# Patient Record
Sex: Male | Born: 1960 | Race: White | Hispanic: No | Marital: Single
Health system: Southern US, Community
[De-identification: ages and names within clinical notes are randomized; demographics above are authoritative.]

## PROBLEM LIST (undated history)

## (undated) DIAGNOSIS — F1010 Alcohol abuse, uncomplicated: Secondary | ICD-10-CM

---

## 2009-09-30 ENCOUNTER — Emergency Department (HOSPITAL_COMMUNITY): Admission: EM | Admit: 2009-09-30 | Discharge: 2009-10-01 | Payer: Self-pay | Admitting: Emergency Medicine

## 2011-03-10 LAB — DIFFERENTIAL
Basophils Absolute: 0 10*3/uL (ref 0.0–0.1)
Eosinophils Relative: 11 % — ABNORMAL HIGH (ref 0–5)
Lymphocytes Relative: 54 % — ABNORMAL HIGH (ref 12–46)
Lymphs Abs: 3.4 10*3/uL (ref 0.7–4.0)
Neutro Abs: 1.8 10*3/uL (ref 1.7–7.7)
Neutrophils Relative %: 28 % — ABNORMAL LOW (ref 43–77)

## 2011-03-10 LAB — CBC
HCT: 43.6 % (ref 39.0–52.0)
Hemoglobin: 15.2 g/dL (ref 13.0–17.0)
Platelets: 224 10*3/uL (ref 150–400)

## 2011-03-10 LAB — RAPID URINE DRUG SCREEN, HOSP PERFORMED
Amphetamines: NOT DETECTED
Barbiturates: NOT DETECTED
Cocaine: NOT DETECTED
Opiates: NOT DETECTED

## 2011-03-10 LAB — BASIC METABOLIC PANEL
GFR calc non Af Amer: >60 mL/min (ref >60)
Sodium: 138 mEq/L (ref 135–145)

## 2011-03-10 LAB — ETHANOL: Alcohol, Ethyl (B): 237 mg/dL — ABNORMAL HIGH (ref 0–10)

## 2013-11-03 ENCOUNTER — Emergency Department (HOSPITAL_COMMUNITY)
Admission: EM | Admit: 2013-11-03 | Discharge: 2013-11-03 | Disposition: A | Discharge status: Home / Self Care | Payer: Self-pay | Attending: Emergency Medicine | Admitting: Emergency Medicine

## 2013-11-03 ENCOUNTER — Emergency Department (HOSPITAL_COMMUNITY): Payer: Self-pay

## 2013-11-03 ENCOUNTER — Encounter (HOSPITAL_COMMUNITY): Payer: Self-pay | Admitting: Emergency Medicine

## 2013-11-03 DIAGNOSIS — Y929 Unspecified place or not applicable: Secondary | ICD-10-CM | POA: Insufficient documentation

## 2013-11-03 DIAGNOSIS — S0181XA Laceration without foreign body of other part of head, initial encounter: Secondary | ICD-10-CM

## 2013-11-03 DIAGNOSIS — R296 Repeated falls: Secondary | ICD-10-CM | POA: Insufficient documentation

## 2013-11-03 DIAGNOSIS — F1010 Alcohol abuse, uncomplicated: Secondary | ICD-10-CM | POA: Insufficient documentation

## 2013-11-03 DIAGNOSIS — S060X0A Concussion without loss of consciousness, initial encounter: Secondary | ICD-10-CM

## 2013-11-03 DIAGNOSIS — F10929 Alcohol use, unspecified with intoxication, unspecified: Secondary | ICD-10-CM

## 2013-11-03 DIAGNOSIS — F17200 Nicotine dependence, unspecified, uncomplicated: Secondary | ICD-10-CM | POA: Insufficient documentation

## 2013-11-03 DIAGNOSIS — Y939 Activity, unspecified: Secondary | ICD-10-CM | POA: Insufficient documentation

## 2013-11-03 DIAGNOSIS — S0180XA Unspecified open wound of other part of head, initial encounter: Secondary | ICD-10-CM | POA: Insufficient documentation

## 2013-11-03 MED ORDER — LIDOCAINE-EPINEPHRINE-TETRACAINE (LET) SOLUTION
3.0000 mL | Freq: Once | NASAL | Status: AC
  Administered 2013-11-03: 3 mL via TOPICAL
  Filled 2013-11-03: qty 3

## 2013-11-03 NOTE — ED Provider Notes (Signed)
7:00 AM = Laceration repaired for Dr. Oletta Lamas.  Patient states tetanus up to date.  Will observe patient.     LACERATION REPAIR Date/Time: 11/03/2013 7:06 AM Performed by: Coral Ceo K Authorized by: Jillyn Ledger Consent: Verbal consent obtained. Consent given by: patient Body area: head/neck Location details: left eyebrow Laceration length: 4 cm Foreign bodies: no foreign bodies Tendon involvement: none Nerve involvement: none Vascular damage: no Anesthesia: local infiltration Local anesthetic: lidocaine 2% without epinephrine Anesthetic total: 2 ml Patient sedated: no Preparation: Patient was prepped and draped in the usual sterile fashion. Irrigation solution: saline Irrigation method: jet lavage Amount of cleaning: extensive Debridement: none Degree of undermining: none Skin closure: 4-0 Prolene Number of sutures: 6 Technique: simple Approximation: close Approximation difficulty: simple Dressing: antibiotic ointment and 4x4 sterile gauze Patient tolerance: Patient tolerated the procedure well with no immediate complications.    Rechecks 7:40 AM = Patient up walking in the hallway without difficulty.  No concerns at this time.  Patient wants to be discharged.    Filed Vitals:   11/03/13 0456 11/03/13 0500 11/03/13 0545 11/03/13 0751  BP: 144/89 122/78 119/73 101/86  Pulse: 89 74 74 74  Temp:      TempSrc:      Resp: 20   15  SpO2: 98% 98% 96% 99%     Patient stable for discharge  Joseph Bell           Jillyn Ledger, PA-C 11/03/13 9147

## 2013-11-03 NOTE — ED Notes (Addendum)
Patient ambulated in hallway without incident. Patient refusing to go to CT, states he is ready to go.

## 2013-11-03 NOTE — ED Notes (Signed)
Patient discharged to home with family. NAD.  
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
## 2014-11-15 ENCOUNTER — Emergency Department (HOSPITAL_COMMUNITY): Payer: Self-pay

## 2014-11-15 ENCOUNTER — Encounter (HOSPITAL_COMMUNITY): Payer: Self-pay | Admitting: Emergency Medicine

## 2014-11-15 ENCOUNTER — Emergency Department (HOSPITAL_COMMUNITY)
Admission: EM | Admit: 2014-11-15 | Discharge: 2014-11-16 | Disposition: A | Discharge status: Home / Self Care | Payer: Self-pay | Attending: Emergency Medicine | Admitting: Emergency Medicine

## 2014-11-15 DIAGNOSIS — Y998 Other external cause status: Secondary | ICD-10-CM | POA: Insufficient documentation

## 2014-11-15 DIAGNOSIS — F10929 Alcohol use, unspecified with intoxication, unspecified: Secondary | ICD-10-CM

## 2014-11-15 DIAGNOSIS — Y9289 Other specified places as the place of occurrence of the external cause: Secondary | ICD-10-CM | POA: Insufficient documentation

## 2014-11-15 DIAGNOSIS — S0101XA Laceration without foreign body of scalp, initial encounter: Secondary | ICD-10-CM | POA: Insufficient documentation

## 2014-11-15 DIAGNOSIS — W109XXA Fall (on) (from) unspecified stairs and steps, initial encounter: Secondary | ICD-10-CM | POA: Insufficient documentation

## 2014-11-15 DIAGNOSIS — F10129 Alcohol abuse with intoxication, unspecified: Secondary | ICD-10-CM | POA: Insufficient documentation

## 2014-11-15 DIAGNOSIS — T1490XA Injury, unspecified, initial encounter: Secondary | ICD-10-CM

## 2014-11-15 DIAGNOSIS — Y9389 Activity, other specified: Secondary | ICD-10-CM | POA: Insufficient documentation

## 2014-11-15 DIAGNOSIS — Z720 Tobacco use: Secondary | ICD-10-CM | POA: Insufficient documentation

## 2014-11-15 LAB — CBC
HCT: 42 % (ref 39.0–52.0)
HEMOGLOBIN: 15.2 g/dL (ref 13.0–17.0)
MCH: 36 pg — ABNORMAL HIGH (ref 26.0–34.0)
MCHC: 36.2 g/dL — ABNORMAL HIGH (ref 30.0–36.0)
MCV: 99.5 fL (ref 78.0–100.0)
PLATELETS: 173 10*3/uL (ref 150–400)
RBC: 4.22 MIL/uL (ref 4.22–5.81)
RDW: 12.3 % (ref 11.5–15.5)
WBC: 5.9 10*3/uL (ref 4.0–10.5)

## 2014-11-15 LAB — COMPREHENSIVE METABOLIC PANEL
ALT: 24 U/L (ref 0–53)
AST: 41 U/L — AB (ref 0–37)
Albumin: 4 g/dL (ref 3.5–5.2)
Alkaline Phosphatase: 46 U/L (ref 39–117)
Anion gap: 15 (ref 5–15)
BILIRUBIN TOTAL: 0.3 mg/dL (ref 0.3–1.2)
BUN: 6 mg/dL (ref 6–23)
CALCIUM: 9 mg/dL (ref 8.4–10.5)
CO2: 22 mEq/L (ref 19–32)
CREATININE: 0.77 mg/dL (ref 0.50–1.35)
Chloride: 101 mEq/L (ref 96–112)
GFR calc Af Amer: >90 mL/min (ref >90)
Glucose, Bld: 93 mg/dL (ref 70–99)
POTASSIUM: 4.1 meq/L (ref 3.7–5.3)
Sodium: 138 mEq/L (ref 137–147)
Total Protein: 7.3 g/dL (ref 6.0–8.3)

## 2014-11-15 LAB — PROTIME-INR
INR: 1.04 (ref 0.00–1.49)
Prothrombin Time: 13.7 seconds (ref 11.6–15.2)

## 2014-11-15 MED ORDER — HALOPERIDOL LACTATE 5 MG/ML IJ SOLN
2.0000 mg | Freq: Once | INTRAMUSCULAR | Status: AC
  Administered 2014-11-15: 2 mg via INTRAVENOUS
  Filled 2014-11-15: qty 1

## 2014-11-15 MED ORDER — HALOPERIDOL LACTATE 5 MG/ML IJ SOLN
INTRAMUSCULAR | Status: AC | PRN
  Administered 2014-11-15: 2 mg via INTRAVENOUS

## 2014-11-15 MED ORDER — FENTANYL CITRATE 0.05 MG/ML IJ SOLN
INTRAMUSCULAR | Status: AC
  Administered 2014-11-15: 50 ug
  Filled 2014-11-15: qty 2

## 2014-11-15 MED ORDER — HALOPERIDOL LACTATE 5 MG/ML IJ SOLN
INTRAMUSCULAR | Status: AC
  Administered 2014-11-15: 2 mg via INTRAVENOUS
  Filled 2014-11-15: qty 1

## 2014-11-15 MED ORDER — SODIUM CHLORIDE 0.9 % IV BOLUS (SEPSIS)
500.0000 mL | Freq: Once | INTRAVENOUS | Status: AC
  Administered 2014-11-15: 500 mL via INTRAVENOUS

## 2014-11-15 MED ORDER — FENTANYL CITRATE 0.05 MG/ML IJ SOLN
50.0000 ug | Freq: Once | INTRAMUSCULAR | Status: AC
  Administered 2014-11-15: 50 ug via INTRAVENOUS
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
## 2014-11-15 NOTE — ED Notes (Signed)
Pt transported to CT via transport and this RN. Josh, EMT at bedside.
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
## 2014-11-15 NOTE — ED Notes (Signed)
Pt wallet with ID-NO CASH (verified with Josh, EMT) and clothing removed by EMS placed in pt belonging bag and placed at bedside.

## 2014-11-15 NOTE — ED Notes (Signed)
Pt transported to x-ray with this RN and Josh, EMT.

## 2014-11-15 NOTE — ED Notes (Signed)
Pt arrived back in TRA A, placed on heart monitor. Oxygen saturations noted to be 88-89% RA, pt placed on 2L nasal cannula. Current oxygen saturation 98% on 2L.
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
## 2014-11-16 NOTE — Discharge Instructions (Signed)
Alcohol Intoxication Alcohol intoxication occurs when the amount of alcohol that a person has consumed impairs his or her ability to mentally and physically function. Alcohol directly impairs the normal chemical activity of the brain. Drinking large amounts of alcohol can lead to changes in mental function and behavior, and it can cause many physical effects that can be harmful.  Alcohol intoxication can range in severity from mild to very severe. Various factors can affect the level of intoxication that occurs, such as the person's age, gender, weight, frequency of alcohol consumption, and the presence of other medical conditions (such as diabetes, seizures, or heart conditions). Dangerous levels of alcohol intoxication may occur when people drink large amounts of alcohol in a short period (binge drinking). Alcohol can also be especially dangerous when combined with certain prescription medicines or "recreational" drugs. SIGNS AND SYMPTOMS Some common signs and symptoms of mild alcohol intoxication include:  Loss of coordination.  Changes in mood and behavior.  Impaired judgment.  Slurred speech. As alcohol intoxication progresses to more severe levels, other signs and symptoms will appear. These may include:  Vomiting.  Confusion and impaired memory.  Slowed breathing.  Seizures.  Loss of consciousness. DIAGNOSIS  Your health care provider will take a medical history and perform a physical exam. You will be asked about the amount and type of alcohol you have consumed. Blood tests will be done to measure the concentration of alcohol in your blood. In many places, your blood alcohol level must be lower than 80 mg/dL (4.09%0.08%) to legally drive. However, many dangerous effects of alcohol can occur at much lower levels.  TREATMENT  People with alcohol intoxication often do not require treatment. Most of the effects of alcohol intoxication are temporary, and they go away as the alcohol naturally  leaves the body. Your health care provider will monitor your condition until you are stable enough to go home. Fluids are sometimes given through an IV access tube to help prevent dehydration.  HOME CARE INSTRUCTIONS  Do not drive after drinking alcohol.  Stay hydrated. Drink enough water and fluids to keep your urine clear or pale yellow. Avoid caffeine.   Only take over-the-counter or prescription medicines as directed by your health care provider.  SEEK MEDICAL CARE IF:   You have persistent vomiting.   You do not feel better after a few days.  You have frequent alcohol intoxication. Your health care provider can help determine if you should see a substance use treatment counselor. SEEK IMMEDIATE MEDICAL CARE IF:   You become shaky or tremble when you try to stop drinking.   You shake uncontrollably (seizure).   You throw up (vomit) blood. This may be bright red or may look like black coffee grounds.   You have blood in your stool. This may be bright red or may appear as a black, tarry, bad smelling stool.   You become lightheaded or faint.  MAKE SURE YOU:   Understand these instructions.  Will watch your condition.  Will get help right away if you are not doing well or get worse. Document Released: 08/31/2005 Document Revised: 07/24/2013 Document Reviewed: 04/26/2013 [facility] Patient Information 2015 [place], [place]. This information is not intended to replace advice given to you by your health care provider. Make sure you discuss any questions you have with your health care provider.  Staple Care and Removal Your caregiver has used staples today to repair your wound. Staples are used to help a wound heal faster by holding the  edges of the wound together. The staples can be removed when the wound has healed well enough to stay together after the staples are removed. A dressing (wound covering), depending on the location of the wound, may have been applied. This may be  changed once per day or as instructed. If the dressing sticks, it may be soaked off with soapy water or hydrogen peroxide. Only take over-the-counter or prescription medicines for pain, discomfort, or fever as directed by your caregiver.  If you did not receive a tetanus shot today because you did not recall when your last one was given, check with your caregiver when you have your staples removed to determine if one is needed. Return to your caregiver's office in 1 week or as suggested to have your staples removed. SEEK IMMEDIATE MEDICAL CARE IF:   You have redness, swelling, or increasing pain in the wound.  You have pus coming from the wound.  You have a fever.  You notice a bad smell coming from the wound or dressing.  Your wound edges break open after staples have been removed. Document Released: 08/16/2001 Document Revised: 02/13/2012 Document Reviewed: 08/31/2005 [facility] Patient Information 2015 [place], [place]. This information is not intended to replace advice given to you by your health care provider. Make sure you discuss any questions you have with your health care provider.

## 2014-11-16 NOTE — ED Provider Notes (Signed)
Patient has been observed overnight in the ED. He is resting comfortably and is now awake and alert and stable for discharge.  Joseph Boozeavid Quintarius Ferns, MD 11/16/14 [phone]

## 2014-11-16 NOTE — ED Notes (Signed)
Supplies to bedside for irrigation

## 2014-11-16 NOTE — ED Notes (Signed)
Dr Preston FleetingGlick in w/pt

## 2014-11-16 NOTE — ED Notes (Signed)
7-8 staples placed to laceration at posterior head. Bleeding controlled. Pt tolerated well

## 2017-03-18 ENCOUNTER — Emergency Department (HOSPITAL_COMMUNITY)
Admission: EM | Admit: 2017-03-18 | Discharge: 2017-03-18 | Disposition: A | Discharge status: Home / Self Care | Payer: Self-pay | Attending: Emergency Medicine | Admitting: Emergency Medicine

## 2017-03-18 ENCOUNTER — Emergency Department (HOSPITAL_COMMUNITY): Payer: Self-pay

## 2017-03-18 ENCOUNTER — Encounter (HOSPITAL_COMMUNITY): Payer: Self-pay | Admitting: Emergency Medicine

## 2017-03-18 DIAGNOSIS — T391X1A Poisoning by 4-Aminophenol derivatives, accidental (unintentional), initial encounter: Secondary | ICD-10-CM | POA: Insufficient documentation

## 2017-03-18 DIAGNOSIS — R0602 Shortness of breath: Secondary | ICD-10-CM | POA: Insufficient documentation

## 2017-03-18 DIAGNOSIS — F17200 Nicotine dependence, unspecified, uncomplicated: Secondary | ICD-10-CM | POA: Insufficient documentation

## 2017-03-18 DIAGNOSIS — F19929 Other psychoactive substance use, unspecified with intoxication, unspecified: Secondary | ICD-10-CM

## 2017-03-18 DIAGNOSIS — T402X1A Poisoning by other opioids, accidental (unintentional), initial encounter: Secondary | ICD-10-CM | POA: Insufficient documentation

## 2017-03-18 LAB — COMPREHENSIVE METABOLIC PANEL
ALT: 28 U/L (ref 17–63)
AST: 34 U/L (ref 15–41)
Albumin: 3.7 g/dL (ref 3.5–5.0)
Alkaline Phosphatase: 44 U/L (ref 38–126)
Anion gap: 10 (ref 5–15)
BUN: 5 mg/dL — ABNORMAL LOW (ref 6–20)
CHLORIDE: 107 mmol/L (ref 101–111)
CO2: 22 mmol/L (ref 22–32)
CREATININE: 0.58 mg/dL — AB (ref 0.61–1.24)
Calcium: 8.1 mg/dL — ABNORMAL LOW (ref 8.9–10.3)
Glucose, Bld: 104 mg/dL — ABNORMAL HIGH (ref 65–99)
POTASSIUM: 3.3 mmol/L — AB (ref 3.5–5.1)
SODIUM: 139 mmol/L (ref 135–145)
Total Bilirubin: 0.4 mg/dL (ref 0.3–1.2)
Total Protein: 6.8 g/dL (ref 6.5–8.1)

## 2017-03-18 LAB — CBC
HCT: 36.8 % — ABNORMAL LOW (ref 39.0–52.0)
Hemoglobin: 13.2 g/dL (ref 13.0–17.0)
MCH: 34.8 pg — ABNORMAL HIGH (ref 26.0–34.0)
MCHC: 35.9 g/dL (ref 30.0–36.0)
MCV: 97.1 fL (ref 78.0–100.0)
Platelets: 222 10*3/uL (ref 150–400)
RBC: 3.79 MIL/uL — AB (ref 4.22–5.81)
RDW: 13.4 % (ref 11.5–15.5)
WBC: 5.3 10*3/uL (ref 4.0–10.5)

## 2017-03-18 LAB — RAPID URINE DRUG SCREEN, HOSP PERFORMED
AMPHETAMINES: NOT DETECTED
Barbiturates: NOT DETECTED
Benzodiazepines: NOT DETECTED
Cocaine: POSITIVE — AB
OPIATES: POSITIVE — AB
Tetrahydrocannabinol: NOT DETECTED

## 2017-03-18 LAB — SALICYLATE LEVEL

## 2017-03-18 LAB — ETHANOL: Alcohol, Ethyl (B): 266 mg/dL — ABNORMAL HIGH (ref <5)

## 2017-03-18 LAB — ACETAMINOPHEN LEVEL: Acetaminophen (Tylenol), Serum: <10 ug/mL — ABNORMAL LOW (ref 10–30)

## 2017-03-18 NOTE — ED Notes (Signed)
Pt placed of 4 lpm [place] while sleeping.

## 2017-03-18 NOTE — ED Triage Notes (Signed)
Per EMS pt roommate called related to pt collapsing and believes it was an overdose "because he talked about getting percocet." With EMS arrival pt unresponsive, not breathing, and pupils pin point, but had a pulse. Pt given 0.5 mg of narcan en route with EMS, post administration pt able to breath on his own.

## 2017-03-18 NOTE — ED Notes (Signed)
Patient requesting to go home.

## 2017-03-18 NOTE — ED Notes (Signed)
Requested patient to urinate. 

## 2017-03-18 NOTE — ED Provider Notes (Signed)
8:16 PM Assumed care from Dr. Fredderick Phenix, please see their note for full history, physical and decision making until this point. In brief this is a 56 y.o. year old male who presented to the ED tonight with Drug Overdose     Here for ingestion of alcohol and opiates and waiting to metabolize. Workup otherwise negative.   I was advised by nursing patient was awake and wanting to go home.  On my evaluation, alert, oriented, no slurred speech, knows how to get home, good balance, able to tie shoes without difficulty. Answers questions in full sentences.   Appears to be clinically sober with normal vital signs.   Plan for discharge.   Discharge instructions, including strict return precautions for new or worsening symptoms, given. Patient and/or family verbalized understanding and agreement with the plan as described.   Labs, studies and imaging reviewed by myself and considered in medical decision making if ordered. Imaging interpreted by radiology.  Labs Reviewed  COMPREHENSIVE METABOLIC PANEL - Abnormal; Notable for the following:       Result Value   Potassium 3.3 (*)    Glucose, Bld 104 (*)    BUN 5 (*)    Creatinine, Ser 0.58 (*)    Calcium 8.1 (*)    All other components within normal limits  ETHANOL - Abnormal; Notable for the following:    Alcohol, Ethyl (B) 266 (*)    All other components within normal limits  ACETAMINOPHEN LEVEL - Abnormal; Notable for the following:    Acetaminophen (Tylenol), Serum <10 (*)    All other components within normal limits  CBC - Abnormal; Notable for the following:    RBC 3.79 (*)    HCT 36.8 (*)    MCH 34.8 (*)    All other components within normal limits  SALICYLATE LEVEL  RAPID URINE DRUG SCREEN, HOSP PERFORMED  CBG MONITORING, ED    DG Chest Port 1 View  Final Result    CT Head Wo Contrast  Final Result    CT Cervical Spine Wo Contrast  Final Result      No Follow-up on file.    Marily Memos, MD 03/18/17 2019

## 2017-03-18 NOTE — ED Provider Notes (Signed)
[facility]-EMERGENCY DEPT Provider Note   CSN: [id number] Arrival date & time: 03/18/17  1513     History   Chief Complaint Chief Complaint  Patient presents with  . Drug Overdose    HPI Joseph Bell is a 56 y.o. male.  Patient is a 56 year old male with a history of alcohol abuse who presents with possible overdose. Per EMS, he lives with a roommate who states that earlier in the afternoon the patient was seen on the deck acting normally.  The roommate went to the store and when they got back and found the patient less responsive. He was grabbing for something on the counter and then collapsed to the floor. Did not appear that there was any notable head trauma. The patient became unresponsive and EMS was called. He was given 0.5 mg of Narcan with some improvement in symptoms. He's been maintaining normal oxygen saturations but has still been confused. Patient denies any drug use. He does say that he's had 2 40 ounce beers today.       History reviewed. No pertinent past medical history.  There are no active problems to display for this patient.   History reviewed. No pertinent surgical history.     Home Medications    Prior to Admission medications   Not on File    Family History No family history on file.  Social History Social History  Substance Use Topics  . Smoking status: Current Every Day Smoker  . Smokeless tobacco: Not on file  . Alcohol use Yes     Allergies   Patient has no known allergies.   Review of Systems Review of Systems  Constitutional: Negative for chills, diaphoresis, fatigue and fever.  HENT: Negative for congestion, rhinorrhea and sneezing.   Eyes: Negative.   Respiratory: Negative for cough, chest tightness and shortness of breath.   Cardiovascular: Negative for chest pain and leg swelling.  Gastrointestinal: Negative for abdominal pain, blood in stool, diarrhea, nausea and vomiting.  Genitourinary: Negative for difficulty  urinating, flank pain, frequency and hematuria.  Musculoskeletal: Negative for arthralgias and back pain.  Skin: Negative for rash.  Neurological: Negative for dizziness, speech difficulty, weakness, numbness and headaches.     Physical Exam Updated Vital Signs BP 104/65   Pulse 63   Temp 97.9 F (36.6 C) (Oral)   Resp (!) 9   Wt 180 lb (81.6 kg)   SpO2 99%   BMI 29.05 kg/m   Physical Exam  Constitutional: He is oriented to person, place, and time. He appears well-developed and well-nourished.  HENT:  Head: Normocephalic and atraumatic.  Nose: Nose normal.  Eyes: Conjunctivae are normal. Pupils are equal, round, and reactive to light.  Neck:  No pain to the cervical, thoracic, or LS spine.  No step-offs or deformities noted  Cardiovascular: Normal rate and regular rhythm.   No murmur heard. No evidence of external trauma to the chest or abdomen  Pulmonary/Chest: Effort normal and breath sounds normal. No respiratory distress. He has no wheezes. He exhibits no tenderness.  Abdominal: Soft. Bowel sounds are normal. He exhibits no distension. There is no tenderness.  Musculoskeletal: Normal range of motion.  No pain on palpation or ROM of the extremities  Neurological: He is alert and oriented to person, place, and time.  Skin: Skin is warm and dry. Capillary refill takes less than 2 seconds.  Psychiatric: He has a normal mood and affect.  Vitals reviewed.    ED Treatments / Results  Labs (  all labs ordered are listed, but only abnormal results are displayed) Labs Reviewed  COMPREHENSIVE METABOLIC PANEL - Abnormal; Notable for the following:       Result Value   Potassium 3.3 (*)    Glucose, Bld 104 (*)    BUN 5 (*)    Creatinine, Ser 0.58 (*)    Calcium 8.1 (*)    All other components within normal limits  CBC - Abnormal; Notable for the following:    RBC 3.79 (*)    HCT 36.8 (*)    MCH 34.8 (*)    All other components within normal limits  ETHANOL  SALICYLATE  LEVEL  ACETAMINOPHEN LEVEL  RAPID URINE DRUG SCREEN, HOSP PERFORMED  CBG MONITORING, ED    EKG  EKG Interpretation  Date/Time:  Saturday March 18 2017 15:17:34 EDT Ventricular Rate:  71 PR Interval:    QRS Duration: 163 QT Interval:  454 QTC Calculation: 494 R Axis:   84 Text Interpretation:  Sinus rhythm Right bundle branch block No old tracing to compare Confirmed by Mashayla Lavin  MD, Evalee Gerard ([id number]) on 03/18/2017 3:26:47 PM       Radiology Ct Head Wo Contrast  Result Date: 03/18/2017 CLINICAL DATA:  Overdose.  Unresponsive.  Narcan administered. EXAM: CT HEAD WITHOUT CONTRAST CT CERVICAL SPINE WITHOUT CONTRAST TECHNIQUE: Multidetector CT imaging of the head and cervical spine was performed following the standard protocol without intravenous contrast. Multiplanar CT image reconstructions of the cervical spine were also generated. COMPARISON:  11/15/2014 head and cervical spine CT. FINDINGS: CT HEAD FINDINGS Brain: No evidence of parenchymal hemorrhage or extra-axial fluid collection. No mass lesion, mass effect, or midline shift. No CT evidence of acute infarction. Nonspecific mild subcortical and periventricular white matter hypodensity, most in keeping with chronic small vessel ischemic change. Cerebral volume is age appropriate. Stable focal cortical calcification in the medial right frontal lobe, compatible with remote posttraumatic or post inflammatory insult. No ventriculomegaly. Vascular: No acute abnormality. Skull: No evidence of calvarial fracture. Sinuses/Orbits: The visualized paranasal sinuses are essentially clear. Other: Partial right mastoid effusion. The left mastoid air cells are unopacified. CT CERVICAL SPINE FINDINGS Alignment: Straightening of the cervical spine. No subluxation. Dens is well positioned between the lateral masses of C1. Skull base and vertebrae: No acute fracture. No primary bone lesion or focal pathologic process. Soft tissues and spinal canal: No prevertebral  fluid or swelling. No visible canal hematoma. Disc levels: Moderate degenerative disc disease in the mid to lower cervical spine, most prominent at C5-6. Mild bilateral facet arthropathy. Mild degenerative foraminal stenosis bilaterally at C5-6. Upper chest: Negative. Other: Partial right mastoid effusion. Visualized left mastoid air cells appear clear. No discrete thyroid nodules. No pathologically enlarged cervical nodes. IMPRESSION: 1. No evidence of acute intracranial abnormality. No evidence of calvarial fracture. 2. Mild chronic small vessel ischemia. 3. No cervical spine fracture or subluxation. 4. Mild-to-moderate degenerative changes in the cervical spine as detailed. 5. Nonspecific partial right mastoid effusion. Electronically Signed   By: Delbert Phenix M.D.   On: 03/18/2017 16:16   Ct Cervical Spine Wo Contrast  Result Date: 03/18/2017 CLINICAL DATA:  Overdose.  Unresponsive.  Narcan administered. EXAM: CT HEAD WITHOUT CONTRAST CT CERVICAL SPINE WITHOUT CONTRAST TECHNIQUE: Multidetector CT imaging of the head and cervical spine was performed following the standard protocol without intravenous contrast. Multiplanar CT image reconstructions of the cervical spine were also generated. COMPARISON:  11/15/2014 head and cervical spine CT. FINDINGS: CT HEAD FINDINGS Brain: No evidence of parenchymal  hemorrhage or extra-axial fluid collection. No mass lesion, mass effect, or midline shift. No CT evidence of acute infarction. Nonspecific mild subcortical and periventricular white matter hypodensity, most in keeping with chronic small vessel ischemic change. Cerebral volume is age appropriate. Stable focal cortical calcification in the medial right frontal lobe, compatible with remote posttraumatic or post inflammatory insult. No ventriculomegaly. Vascular: No acute abnormality. Skull: No evidence of calvarial fracture. Sinuses/Orbits: The visualized paranasal sinuses are essentially clear. Other: Partial right  mastoid effusion. The left mastoid air cells are unopacified. CT CERVICAL SPINE FINDINGS Alignment: Straightening of the cervical spine. No subluxation. Dens is well positioned between the lateral masses of C1. Skull base and vertebrae: No acute fracture. No primary bone lesion or focal pathologic process. Soft tissues and spinal canal: No prevertebral fluid or swelling. No visible canal hematoma. Disc levels: Moderate degenerative disc disease in the mid to lower cervical spine, most prominent at C5-6. Mild bilateral facet arthropathy. Mild degenerative foraminal stenosis bilaterally at C5-6. Upper chest: Negative. Other: Partial right mastoid effusion. Visualized left mastoid air cells appear clear. No discrete thyroid nodules. No pathologically enlarged cervical nodes. IMPRESSION: 1. No evidence of acute intracranial abnormality. No evidence of calvarial fracture. 2. Mild chronic small vessel ischemia. 3. No cervical spine fracture or subluxation. 4. Mild-to-moderate degenerative changes in the cervical spine as detailed. 5. Nonspecific partial right mastoid effusion. Electronically Signed   By: Delbert Phenix M.D.   On: 03/18/2017 16:16   Dg Chest Port 1 View  Result Date: 03/18/2017 CLINICAL DATA:  Overdose and shortness of breath. EXAM: PORTABLE CHEST 1 VIEW COMPARISON:  11/15/2014 FINDINGS: The cardiomediastinal silhouette is unremarkable. There is no evidence of focal airspace disease, pulmonary edema, suspicious pulmonary nodule/mass, pleural effusion, or pneumothorax. No acute bony abnormalities are identified. A remote left clavicle fracture again noted. IMPRESSION: No active disease. Electronically Signed   By: Harmon Pier M.D.   On: 03/18/2017 16:16    Procedures Procedures (including critical care time)  Medications Ordered in ED Medications - No data to display   Initial Impression / Assessment and Plan / ED Course  I have reviewed the triage vital signs and the nursing  notes.  Pertinent labs & imaging results that were available during my care of the patient were reviewed by me and considered in my medical decision making (see chart for details).  Clinical Course as of Mar 18 1702  Sat Mar 18, 2017  1623 Pulse ox dropped into 70s while pt sleeping, but pt is arousable and will answer questions, placed on nasal cannula  [MB]    Clinical Course User Index [MB] Rolan Bucco, MD    Patient presents with altered mental status. He does admit to alcohol use and it was reported that he may have taken some Percocet as well. Labs are pending as well as EtOH level. Patient is maintaining his airway. There is no evidence of aspiration. There is no evidence of intracranial hemorrhage on head CT. Patient will need observation. A follow-up on labs. Will turn care over to Dr. Erin Hearing.  Final Clinical Impressions(s) / ED Diagnoses   Final diagnoses:  None    New Prescriptions New Prescriptions   No medications on file     Rolan Bucco, MD 03/18/17 1704

## 2017-10-28 ENCOUNTER — Emergency Department (HOSPITAL_COMMUNITY)
Admission: EM | Admit: 2017-10-28 | Discharge: 2017-10-28 | Discharge status: Against Medical Advice | Payer: Self-pay | Attending: Emergency Medicine | Admitting: Emergency Medicine

## 2017-10-28 ENCOUNTER — Emergency Department (HOSPITAL_COMMUNITY): Payer: Self-pay

## 2017-10-28 DIAGNOSIS — W228XXA Striking against or struck by other objects, initial encounter: Secondary | ICD-10-CM | POA: Insufficient documentation

## 2017-10-28 DIAGNOSIS — S0101XA Laceration without foreign body of scalp, initial encounter: Secondary | ICD-10-CM | POA: Insufficient documentation

## 2017-10-28 DIAGNOSIS — Z5329 Procedure and treatment not carried out because of patient's decision for other reasons: Secondary | ICD-10-CM | POA: Insufficient documentation

## 2017-10-28 DIAGNOSIS — Y929 Unspecified place or not applicable: Secondary | ICD-10-CM | POA: Insufficient documentation

## 2017-10-28 DIAGNOSIS — F10920 Alcohol use, unspecified with intoxication, uncomplicated: Secondary | ICD-10-CM | POA: Insufficient documentation

## 2017-10-28 DIAGNOSIS — F17200 Nicotine dependence, unspecified, uncomplicated: Secondary | ICD-10-CM | POA: Insufficient documentation

## 2017-10-28 DIAGNOSIS — Y999 Unspecified external cause status: Secondary | ICD-10-CM | POA: Insufficient documentation

## 2017-10-28 DIAGNOSIS — Y939 Activity, unspecified: Secondary | ICD-10-CM | POA: Insufficient documentation

## 2017-10-28 DIAGNOSIS — Z23 Encounter for immunization: Secondary | ICD-10-CM | POA: Insufficient documentation

## 2017-10-28 MED ORDER — ACETAMINOPHEN 500 MG PO TABS
1000.0000 mg | ORAL_TABLET | Freq: Once | ORAL | Status: AC
  Administered 2017-10-28: 1000 mg via ORAL
  Filled 2017-10-28: qty 2

## 2017-10-28 MED ORDER — TETANUS-DIPHTH-ACELL PERTUSSIS 5-2.5-18.5 LF-MCG/0.5 IM SUSP
0.5000 mL | Freq: Once | INTRAMUSCULAR | Status: AC
  Administered 2017-10-28: 0.5 mL via INTRAMUSCULAR
  Filled 2017-10-28: qty 0.5
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
## 2017-10-28 NOTE — ED Notes (Signed)
Pt wound cleaned with peroxide and sterile water. Laceration noted to back of head. Bleeding controlled.

## 2017-10-28 NOTE — ED Notes (Signed)
Patient transported to CT 

## 2017-10-28 NOTE — ED Notes (Signed)
5 staples placed in pt head by Dr. Shanon RosserPage
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
## 2017-10-28 NOTE — ED Notes (Signed)
Pt eloped.

## 2018-10-09 ENCOUNTER — Encounter (HOSPITAL_COMMUNITY): Payer: Self-pay | Admitting: Emergency Medicine

## 2018-10-09 ENCOUNTER — Emergency Department (HOSPITAL_COMMUNITY)
Admission: EM | Admit: 2018-10-09 | Discharge: 2018-10-10 | Disposition: A | Discharge status: Home / Self Care | Payer: Self-pay | Attending: Emergency Medicine | Admitting: Emergency Medicine

## 2018-10-09 DIAGNOSIS — F10929 Alcohol use, unspecified with intoxication, unspecified: Secondary | ICD-10-CM | POA: Insufficient documentation

## 2018-10-09 DIAGNOSIS — Z5321 Procedure and treatment not carried out due to patient leaving prior to being seen by health care provider: Secondary | ICD-10-CM | POA: Insufficient documentation

## 2018-10-09 HISTORY — DX: Alcohol abuse, uncomplicated: F10.10

## 2018-10-09 LAB — RAPID URINE DRUG SCREEN, HOSP PERFORMED
Amphetamines: NOT DETECTED
BARBITURATES: NOT DETECTED
BENZODIAZEPINES: NOT DETECTED
COCAINE: NOT DETECTED
Opiates: NOT DETECTED
TETRAHYDROCANNABINOL: NOT DETECTED

## 2018-10-09 LAB — CBC
HEMATOCRIT: 44.3 % (ref 39.0–52.0)
HEMOGLOBIN: 14.8 g/dL (ref 13.0–17.0)
MCH: 34.2 pg — ABNORMAL HIGH (ref 26.0–34.0)
MCHC: 33.4 g/dL (ref 30.0–36.0)
MCV: 102.3 fL — ABNORMAL HIGH (ref 80.0–100.0)
NRBC: 0 % (ref 0.0–0.2)
Platelets: 177 10*3/uL (ref 150–400)
RBC: 4.33 MIL/uL (ref 4.22–5.81)
RDW: 12.4 % (ref 11.5–15.5)
WBC: 5.2 10*3/uL (ref 4.0–10.5)

## 2018-10-09 LAB — COMPREHENSIVE METABOLIC PANEL
ALT: 50 U/L — AB (ref 0–44)
ANION GAP: 12 (ref 5–15)
AST: 67 U/L — ABNORMAL HIGH (ref 15–41)
Albumin: 4 g/dL (ref 3.5–5.0)
Alkaline Phosphatase: 64 U/L (ref 38–126)
BUN: <5 mg/dL — ABNORMAL LOW (ref 6–20)
CHLORIDE: 103 mmol/L (ref 98–111)
CO2: 22 mmol/L (ref 22–32)
Calcium: 8.6 mg/dL — ABNORMAL LOW (ref 8.9–10.3)
Creatinine, Ser: 0.7 mg/dL (ref 0.61–1.24)
GFR calc non Af Amer: >60 mL/min (ref >60)
Glucose, Bld: 84 mg/dL (ref 70–99)
Potassium: 3.7 mmol/L (ref 3.5–5.1)
SODIUM: 137 mmol/L (ref 135–145)
Total Bilirubin: 0.8 mg/dL (ref 0.3–1.2)
Total Protein: 7.2 g/dL (ref 6.5–8.1)

## 2018-10-09 LAB — ETHANOL: Alcohol, Ethyl (B): 412 mg/dL (ref <10)

## 2018-10-09 NOTE — ED Notes (Signed)
Insisted to step outside despite staff telling him not to

## 2018-10-09 NOTE — ED Notes (Signed)
Called for pt x2 for room, no response. 
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
## 2018-10-10 NOTE — ED Notes (Signed)
No answer in waiting room 

## 2021-05-17 ENCOUNTER — Ambulatory Visit (HOSPITAL_COMMUNITY): Payer: Self-pay

## 2021-05-18 ENCOUNTER — Ambulatory Visit (HOSPITAL_COMMUNITY): Payer: Self-pay

## 2021-06-15 ENCOUNTER — Inpatient Hospital Stay (HOSPITAL_COMMUNITY): Payer: Self-pay

## 2021-06-15 ENCOUNTER — Encounter (HOSPITAL_COMMUNITY): Payer: Self-pay

## 2021-06-15 ENCOUNTER — Other Ambulatory Visit: Payer: Self-pay

## 2021-06-15 ENCOUNTER — Inpatient Hospital Stay (HOSPITAL_COMMUNITY)
Admission: EM | Admit: 2021-06-15 | Discharge: 2021-06-26 | DRG: 871 | Disposition: A | Discharge status: Home / Self Care | Payer: Self-pay | Attending: Internal Medicine | Admitting: Internal Medicine

## 2021-06-15 ENCOUNTER — Emergency Department (HOSPITAL_COMMUNITY): Payer: Self-pay

## 2021-06-15 DIAGNOSIS — G9341 Metabolic encephalopathy: Secondary | ICD-10-CM | POA: Diagnosis present

## 2021-06-15 DIAGNOSIS — B9561 Methicillin susceptible Staphylococcus aureus infection as the cause of diseases classified elsewhere: Secondary | ICD-10-CM | POA: Diagnosis present

## 2021-06-15 DIAGNOSIS — E512 Wernicke's encephalopathy: Secondary | ICD-10-CM | POA: Diagnosis present

## 2021-06-15 DIAGNOSIS — E871 Hypo-osmolality and hyponatremia: Secondary | ICD-10-CM | POA: Diagnosis present

## 2021-06-15 DIAGNOSIS — R262 Difficulty in walking, not elsewhere classified: Secondary | ICD-10-CM | POA: Diagnosis present

## 2021-06-15 DIAGNOSIS — R54 Age-related physical debility: Secondary | ICD-10-CM | POA: Diagnosis present

## 2021-06-15 DIAGNOSIS — F1010 Alcohol abuse, uncomplicated: Secondary | ICD-10-CM | POA: Diagnosis present

## 2021-06-15 DIAGNOSIS — E639 Nutritional deficiency, unspecified: Secondary | ICD-10-CM | POA: Diagnosis present

## 2021-06-15 DIAGNOSIS — R296 Repeated falls: Principal | ICD-10-CM | POA: Diagnosis present

## 2021-06-15 DIAGNOSIS — M6282 Rhabdomyolysis: Secondary | ICD-10-CM | POA: Diagnosis present

## 2021-06-15 DIAGNOSIS — F1020 Alcohol dependence, uncomplicated: Secondary | ICD-10-CM | POA: Diagnosis present

## 2021-06-15 DIAGNOSIS — D6959 Other secondary thrombocytopenia: Secondary | ICD-10-CM | POA: Diagnosis present

## 2021-06-15 DIAGNOSIS — Z20822 Contact with and (suspected) exposure to covid-19: Secondary | ICD-10-CM | POA: Diagnosis present

## 2021-06-15 DIAGNOSIS — S2232XA Fracture of one rib, left side, initial encounter for closed fracture: Secondary | ICD-10-CM | POA: Diagnosis present

## 2021-06-15 DIAGNOSIS — E876 Hypokalemia: Secondary | ICD-10-CM | POA: Diagnosis present

## 2021-06-15 DIAGNOSIS — W19XXXA Unspecified fall, initial encounter: Secondary | ICD-10-CM | POA: Diagnosis present

## 2021-06-15 DIAGNOSIS — T796XXA Traumatic ischemia of muscle, initial encounter: Secondary | ICD-10-CM

## 2021-06-15 DIAGNOSIS — Z5900 Homelessness unspecified: Secondary | ICD-10-CM

## 2021-06-15 DIAGNOSIS — F17200 Nicotine dependence, unspecified, uncomplicated: Secondary | ICD-10-CM | POA: Diagnosis present

## 2021-06-15 DIAGNOSIS — M25532 Pain in left wrist: Secondary | ICD-10-CM | POA: Diagnosis present

## 2021-06-15 DIAGNOSIS — M109 Gout, unspecified: Secondary | ICD-10-CM | POA: Diagnosis present

## 2021-06-15 DIAGNOSIS — M79632 Pain in left forearm: Secondary | ICD-10-CM | POA: Diagnosis present

## 2021-06-15 DIAGNOSIS — R748 Abnormal levels of other serum enzymes: Secondary | ICD-10-CM | POA: Diagnosis present

## 2021-06-15 DIAGNOSIS — R7881 Bacteremia: Principal | ICD-10-CM | POA: Diagnosis present

## 2021-06-15 DIAGNOSIS — G8929 Other chronic pain: Secondary | ICD-10-CM | POA: Diagnosis present

## 2021-06-15 DIAGNOSIS — A419 Sepsis, unspecified organism: Secondary | ICD-10-CM

## 2021-06-15 DIAGNOSIS — R140 Abdominal distension (gaseous): Secondary | ICD-10-CM

## 2021-06-15 DIAGNOSIS — R7989 Other specified abnormal findings of blood chemistry: Secondary | ICD-10-CM | POA: Diagnosis present

## 2021-06-15 LAB — COMPREHENSIVE METABOLIC PANEL
ALT: 86 U/L — ABNORMAL HIGH (ref 0–44)
ALT: 85 U/L — ABNORMAL HIGH (ref 0–44)
AST: 265 U/L — ABNORMAL HIGH (ref 15–41)
AST: 255 U/L — ABNORMAL HIGH (ref 15–41)
Albumin: 3.4 g/dL — ABNORMAL LOW (ref 3.5–5.0)
Albumin: 3.1 g/dL — ABNORMAL LOW (ref 3.5–5.0)
Alkaline Phosphatase: 44 U/L (ref 38–126)
Alkaline Phosphatase: 59 U/L (ref 38–126)
Anion gap: 12 (ref 5–15)
Anion gap: 12 (ref 5–15)
BUN: 15 mg/dL (ref 6–20)
BUN: 11 mg/dL (ref 6–20)
CO2: 24 mmol/L (ref 22–32)
CO2: 21 mmol/L — ABNORMAL LOW (ref 22–32)
Calcium: 8.5 mg/dL — ABNORMAL LOW (ref 8.9–10.3)
Calcium: 8.1 mg/dL — ABNORMAL LOW (ref 8.9–10.3)
Chloride: 89 mmol/L — ABNORMAL LOW (ref 98–111)
Chloride: 93 mmol/L — ABNORMAL LOW (ref 98–111)
Creatinine, Ser: 0.7 mg/dL (ref 0.61–1.24)
Creatinine, Ser: 0.62 mg/dL (ref 0.61–1.24)
GFR, Estimated: >60 mL/min (ref >60)
GFR, Estimated: >60 mL/min (ref >60)
Glucose, Bld: 110 mg/dL — ABNORMAL HIGH (ref 70–99)
Glucose, Bld: 94 mg/dL (ref 70–99)
Potassium: 3 mmol/L — ABNORMAL LOW (ref 3.5–5.1)
Potassium: 3.2 mmol/L — ABNORMAL LOW (ref 3.5–5.1)
Sodium: 125 mmol/L — ABNORMAL LOW (ref 135–145)
Sodium: 126 mmol/L — ABNORMAL LOW (ref 135–145)
Total Bilirubin: 1.4 mg/dL — ABNORMAL HIGH (ref 0.3–1.2)
Total Bilirubin: 1.6 mg/dL — ABNORMAL HIGH (ref 0.3–1.2)
Total Protein: 6.8 g/dL (ref 6.5–8.1)
Total Protein: 6.7 g/dL (ref 6.5–8.1)

## 2021-06-15 LAB — CBC WITH DIFFERENTIAL/PLATELET
Abs Immature Granulocytes: 0.12 10*3/uL — ABNORMAL HIGH (ref 0.00–0.07)
Abs Immature Granulocytes: 0.16 10*3/uL — ABNORMAL HIGH (ref 0.00–0.07)
Basophils Absolute: 0 10*3/uL (ref 0.0–0.1)
Basophils Absolute: 0 10*3/uL (ref 0.0–0.1)
Basophils Relative: 0 %
Basophils Relative: 0 %
Eosinophils Absolute: 0 10*3/uL (ref 0.0–0.5)
Eosinophils Absolute: 0 10*3/uL (ref 0.0–0.5)
Eosinophils Relative: 0 %
Eosinophils Relative: 0 %
HCT: 38.2 % — ABNORMAL LOW (ref 39.0–52.0)
HCT: 41.6 % (ref 39.0–52.0)
Hemoglobin: 14 g/dL (ref 13.0–17.0)
Hemoglobin: 14.7 g/dL (ref 13.0–17.0)
Immature Granulocytes: 1 %
Immature Granulocytes: 1 %
Lymphocytes Relative: 3 %
Lymphocytes Relative: 2 %
Lymphs Abs: 0.4 10*3/uL — ABNORMAL LOW (ref 0.7–4.0)
Lymphs Abs: 0.2 10*3/uL — ABNORMAL LOW (ref 0.7–4.0)
MCH: 36.3 pg — ABNORMAL HIGH (ref 26.0–34.0)
MCH: 36.5 pg — ABNORMAL HIGH (ref 26.0–34.0)
MCHC: 36.6 g/dL — ABNORMAL HIGH (ref 30.0–36.0)
MCHC: 35.3 g/dL (ref 30.0–36.0)
MCV: 99 fL (ref 80.0–100.0)
MCV: 103.2 fL — ABNORMAL HIGH (ref 80.0–100.0)
Monocytes Absolute: 1.4 10*3/uL — ABNORMAL HIGH (ref 0.1–1.0)
Monocytes Absolute: 0.8 10*3/uL (ref 0.1–1.0)
Monocytes Relative: 11 %
Monocytes Relative: 7 %
Neutro Abs: 11.7 10*3/uL — ABNORMAL HIGH (ref 1.7–7.7)
Neutro Abs: 10.1 10*3/uL — ABNORMAL HIGH (ref 1.7–7.7)
Neutrophils Relative %: 85 %
Neutrophils Relative %: 90 %
Platelets: 86 10*3/uL — ABNORMAL LOW (ref 150–400)
Platelets: 40 10*3/uL — ABNORMAL LOW (ref 150–400)
RBC: 3.86 MIL/uL — ABNORMAL LOW (ref 4.22–5.81)
RBC: 4.03 MIL/uL — ABNORMAL LOW (ref 4.22–5.81)
RDW: 11.6 % (ref 11.5–15.5)
RDW: 11.8 % (ref 11.5–15.5)
WBC: 13.7 10*3/uL — ABNORMAL HIGH (ref 4.0–10.5)
WBC: 11.9 10*3/uL — ABNORMAL HIGH (ref 4.0–10.5)
nRBC: 0 % (ref 0.0–0.2)
nRBC: 0 % (ref 0.0–0.2)

## 2021-06-15 LAB — RENAL FUNCTION PANEL
Albumin: 3.2 g/dL — ABNORMAL LOW (ref 3.5–5.0)
Anion gap: 15 (ref 5–15)
BUN: 10 mg/dL (ref 6–20)
CO2: 20 mmol/L — ABNORMAL LOW (ref 22–32)
Calcium: 8.1 mg/dL — ABNORMAL LOW (ref 8.9–10.3)
Chloride: 93 mmol/L — ABNORMAL LOW (ref 98–111)
Creatinine, Ser: 0.55 mg/dL — ABNORMAL LOW (ref 0.61–1.24)
GFR, Estimated: >60 mL/min (ref >60)
Glucose, Bld: 91 mg/dL (ref 70–99)
Phosphorus: 2.6 mg/dL (ref 2.5–4.6)
Potassium: 3.2 mmol/L — ABNORMAL LOW (ref 3.5–5.1)
Sodium: 128 mmol/L — ABNORMAL LOW (ref 135–145)

## 2021-06-15 LAB — HEPATITIS PANEL, ACUTE
HCV Ab: REACTIVE — AB
Hep A IgM: NONREACTIVE
Hep B C IgM: NONREACTIVE
Hepatitis B Surface Ag: NONREACTIVE

## 2021-06-15 LAB — RESP PANEL BY RT-PCR (FLU A&B, COVID) ARPGX2
Influenza A by PCR: NEGATIVE
Influenza B by PCR: NEGATIVE
SARS Coronavirus 2 by RT PCR: NEGATIVE

## 2021-06-15 LAB — OSMOLALITY, URINE: Osmolality, Ur: 751 mOsm/kg (ref 300–900)

## 2021-06-15 LAB — URINALYSIS, ROUTINE W REFLEX MICROSCOPIC
Bacteria, UA: NONE SEEN
Bilirubin Urine: NEGATIVE
Glucose, UA: NEGATIVE mg/dL
Ketones, ur: 20 mg/dL — AB
Leukocytes,Ua: NEGATIVE
Nitrite: NEGATIVE
Protein, ur: 100 mg/dL — AB
Specific Gravity, Urine: 1.021 (ref 1.005–1.030)
pH: 5 (ref 5.0–8.0)

## 2021-06-15 LAB — HIV ANTIBODY (ROUTINE TESTING W REFLEX): HIV Screen 4th Generation wRfx: NONREACTIVE

## 2021-06-15 LAB — LACTIC ACID, PLASMA
Lactic Acid, Venous: 1.6 mmol/L (ref 0.5–1.9)
Lactic Acid, Venous: 1.7 mmol/L (ref 0.5–1.9)

## 2021-06-15 LAB — PROTIME-INR
INR: 1 (ref 0.8–1.2)
Prothrombin Time: 13.2 seconds (ref 11.4–15.2)

## 2021-06-15 LAB — SODIUM, URINE, RANDOM: Sodium, Ur: 13 mmol/L

## 2021-06-15 LAB — CK: Total CK: 5981 U/L — ABNORMAL HIGH (ref 49–397)

## 2021-06-15 LAB — APTT: aPTT: 34 seconds (ref 24–36)

## 2021-06-15 LAB — MAGNESIUM: Magnesium: 1.9 mg/dL (ref 1.7–2.4)

## 2021-06-15 LAB — ETHANOL: Alcohol, Ethyl (B): <10 mg/dL (ref <10)

## 2021-06-15 MED ORDER — POTASSIUM CHLORIDE CRYS ER 20 MEQ PO TBCR
40.0000 meq | EXTENDED_RELEASE_TABLET | Freq: Once | ORAL | Status: AC
  Administered 2021-06-15: 40 meq via ORAL
  Filled 2021-06-15: qty 2

## 2021-06-15 MED ORDER — SODIUM CHLORIDE 0.9 % IV BOLUS
500.0000 mL | Freq: Once | INTRAVENOUS | Status: AC
  Administered 2021-06-15: 500 mL via INTRAVENOUS

## 2021-06-15 MED ORDER — POTASSIUM CHLORIDE CRYS ER 10 MEQ PO TBCR
40.0000 meq | EXTENDED_RELEASE_TABLET | Freq: Every day | ORAL | Status: DC
  Administered 2021-06-15 – 2021-06-16 (×2): 40 meq via ORAL
  Filled 2021-06-15 (×2): qty 4

## 2021-06-15 MED ORDER — VANCOMYCIN HCL IN DEXTROSE 1-5 GM/200ML-% IV SOLN
1000.0000 mg | Freq: Two times a day (BID) | INTRAVENOUS | Status: DC
  Administered 2021-06-16: 1000 mg via INTRAVENOUS
  Filled 2021-06-15: qty 200

## 2021-06-15 MED ORDER — THIAMINE HCL 100 MG/ML IJ SOLN
100.0000 mg | Freq: Every day | INTRAMUSCULAR | Status: DC
  Administered 2021-06-18: 100 mg via INTRAVENOUS
  Filled 2021-06-15 (×3): qty 2

## 2021-06-15 MED ORDER — ADULT MULTIVITAMIN W/MINERALS CH
1.0000 | ORAL_TABLET | Freq: Every day | ORAL | Status: DC
  Administered 2021-06-15 – 2021-06-26 (×11): 1 via ORAL
  Filled 2021-06-15 (×13): qty 1

## 2021-06-15 MED ORDER — VANCOMYCIN HCL 1750 MG/350ML IV SOLN
1750.0000 mg | Freq: Once | INTRAVENOUS | Status: AC
  Administered 2021-06-15: 1750 mg via INTRAVENOUS
  Filled 2021-06-15: qty 350

## 2021-06-15 MED ORDER — THIAMINE HCL 100 MG PO TABS
100.0000 mg | ORAL_TABLET | Freq: Every day | ORAL | Status: DC
  Administered 2021-06-16 – 2021-06-26 (×9): 100 mg via ORAL
  Filled 2021-06-15 (×9): qty 1

## 2021-06-15 MED ORDER — ONDANSETRON HCL 4 MG/2ML IJ SOLN: 4.0000 mg | Freq: Four times a day (QID) | INTRAMUSCULAR | Status: DC | PRN

## 2021-06-15 MED ORDER — ACETAMINOPHEN 325 MG PO TABS: 650.0000 mg | ORAL_TABLET | Freq: Four times a day (QID) | ORAL | Status: DC | PRN

## 2021-06-15 MED ORDER — THIAMINE HCL 100 MG/ML IJ SOLN
250.0000 mg | Freq: Once | INTRAVENOUS | Status: AC
  Administered 2021-06-15: 250 mg via INTRAVENOUS
  Filled 2021-06-15: qty 2.5

## 2021-06-15 MED ORDER — ACETAMINOPHEN 650 MG RE SUPP: 650.0000 mg | Freq: Four times a day (QID) | RECTAL | Status: DC | PRN

## 2021-06-15 MED ORDER — ONDANSETRON HCL 4 MG PO TABS: 4.0000 mg | ORAL_TABLET | Freq: Four times a day (QID) | ORAL | Status: DC | PRN

## 2021-06-15 MED ORDER — OXYCODONE-ACETAMINOPHEN 5-325 MG PO TABS
1.0000 | ORAL_TABLET | Freq: Once | ORAL | Status: AC
  Administered 2021-06-15: 1 via ORAL
  Filled 2021-06-15: qty 1

## 2021-06-15 MED ORDER — SODIUM CHLORIDE 0.9 % IV BOLUS
1000.0000 mL | Freq: Once | INTRAVENOUS | Status: AC
  Administered 2021-06-15: 1000 mL via INTRAVENOUS

## 2021-06-15 MED ORDER — IBUPROFEN 200 MG PO TABS
400.0000 mg | ORAL_TABLET | Freq: Three times a day (TID) | ORAL | Status: DC | PRN
  Administered 2021-06-15: 400 mg via ORAL
  Filled 2021-06-15: qty 2

## 2021-06-15 MED ORDER — PNEUMOCOCCAL VAC POLYVALENT 25 MCG/0.5ML IJ INJ
0.5000 mL | INJECTION | INTRAMUSCULAR | Status: DC
  Filled 2021-06-15 (×2): qty 0.5

## 2021-06-15 MED ORDER — LORAZEPAM 2 MG/ML IJ SOLN: 1.0000 mg | INTRAMUSCULAR | Status: AC | PRN

## 2021-06-15 MED ORDER — LORAZEPAM 1 MG PO TABS: 1.0000 mg | ORAL_TABLET | ORAL | Status: AC | PRN

## 2021-06-15 MED ORDER — ALBUTEROL SULFATE HFA 108 (90 BASE) MCG/ACT IN AERS
2.0000 | INHALATION_SPRAY | Freq: Once | RESPIRATORY_TRACT | Status: AC
  Administered 2021-06-15: 2 via RESPIRATORY_TRACT
  Filled 2021-06-15: qty 6.7

## 2021-06-15 MED ORDER — FOLIC ACID 1 MG PO TABS
1.0000 mg | ORAL_TABLET | Freq: Every day | ORAL | Status: DC
  Administered 2021-06-15 – 2021-06-26 (×11): 1 mg via ORAL
  Filled 2021-06-15 (×11): qty 1

## 2021-06-15 MED ORDER — SODIUM CHLORIDE 0.9 % IV SOLN
2.0000 g | Freq: Three times a day (TID) | INTRAVENOUS | Status: DC
  Administered 2021-06-15 – 2021-06-16 (×2): 2 g via INTRAVENOUS
  Filled 2021-06-15 (×3): qty 2

## 2021-06-15 MED ORDER — SODIUM CHLORIDE 0.9 % IV SOLN: INTRAVENOUS | Status: DC

## 2021-06-15 MED ORDER — HYDROCODONE-ACETAMINOPHEN 5-325 MG PO TABS
1.0000 | ORAL_TABLET | ORAL | Status: DC | PRN
  Administered 2021-06-15 – 2021-06-21 (×9): 2 via ORAL
  Administered 2021-06-23: 1 via ORAL
  Administered 2021-06-23 – 2021-06-25 (×4): 2 via ORAL
  Administered 2021-06-26: 1 via ORAL
  Filled 2021-06-15 (×5): qty 2
  Filled 2021-06-15: qty 1
  Filled 2021-06-15 (×5): qty 2
  Filled 2021-06-15: qty 1
  Filled 2021-06-15 (×3): qty 2
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
## 2021-06-15 NOTE — Progress Notes (Signed)
Notified [facility] ICU charge RN of need to order antibiotics.

## 2021-06-15 NOTE — Sepsis Progress Note (Signed)
Monitoring for code sepsis protocol. 

## 2021-06-15 NOTE — Progress Notes (Signed)
   06/15/21 1656  Assess: MEWS Score  Temp (!) 102.1 F (38.9 C)  BP (!) 144/94  Pulse Rate (!) 110  Resp (!) 24  SpO2 97 %  Assess: MEWS Score  MEWS Temp 2  MEWS Systolic 0  MEWS Pulse 1  MEWS RR 1  MEWS LOC 0  MEWS Score 4  MEWS Score Color Red  Assess: if the MEWS score is Yellow or Red  Were vital signs taken at a resting state? Yes  Focused Assessment No change from prior assessment  Does the patient meet 2 or more of the SIRS criteria? Yes  Does the patient have a confirmed or suspected source of infection? No  MEWS guidelines implemented *See Row Information* Yes  Treat  MEWS Interventions Administered prn meds/treatments  Take Vital Signs  Increase Vital Sign Frequency  Red: Q 1hr X 4 then Q 4hr X 4, if remains red, continue Q 4hrs  Escalate  MEWS: Escalate Red: discuss with charge nurse/RN and provider, consider discussing with RRT  Notify: Charge Nurse/RN  Name of Charge Nurse/RN Notified Britta Mccreedy, RN/ Hitchita, RN  Date Charge Nurse/RN Notified 06/15/21  Time Charge Nurse/RN Notified 1700  Notify: Rapid Response  Name of Rapid Response RN Notified Christian RN  Date Rapid Response Notified 06/15/21  Time Rapid Response Notified 1710  Document  Patient Outcome Stabilized after interventions  Progress note created (see row info) Yes  Assess: SIRS CRITERIA  SIRS Temperature  1  SIRS Pulse 1  SIRS Respirations  1  SIRS WBC 0  SIRS Score Sum  3
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
## 2021-06-15 NOTE — Progress Notes (Signed)
Notified MD Tyrone in regards broad spectrum antibiotic coverage. MD Tyrone order to place pharmacy consult for vancomycin and cefepime. Notified Pharmacist, Jill Alexanders, in regards to consults.

## 2021-06-15 NOTE — ED Notes (Signed)
Patient transported to X-ray 

## 2021-06-15 NOTE — ED Provider Notes (Signed)
[facility]-EMERGENCY DEPT Provider Note   CSN: [id number] Arrival date & time: 06/15/21  1116     History Chief Complaint  Patient presents with   Hip Pain    Joseph Bell is a 60 y.o. male.  He has a history of alcohol abuse and is homeless.  He is brought in by EMS.  He said he fell yesterday and has pain everywhere.  Rates it 9 out of 10.  He also has a cough and some shortness of breath.  Denies any vomiting or diarrhea.  The history is provided by the patient and the EMS personnel.  Fall The current episode started yesterday. The problem has not changed since onset.Associated symptoms include chest pain and shortness of breath. Pertinent negatives include no abdominal pain and no headaches. The symptoms are aggravated by bending and twisting. Nothing relieves the symptoms. He has tried nothing for the symptoms. The treatment provided no relief.      Past Medical History:  Diagnosis Date   ETOH abuse     There are no problems to display for this patient.   No past surgical history on file.     No family history on file.  Social History   Tobacco Use   Smoking status: Every Day    Pack years: 0.00  Substance Use Topics   Alcohol use: Yes   Drug use: No    Home Medications Prior to Admission medications   Not on File    Allergies    Patient has no known allergies.  Review of Systems   Review of Systems  Constitutional:  Negative for fever.  HENT:  Negative for sore throat.   Eyes:  Negative for visual disturbance.  Respiratory:  Positive for shortness of breath and wheezing.   Cardiovascular:  Positive for chest pain.  Gastrointestinal:  Negative for abdominal pain.  Genitourinary:  Negative for dysuria.  Musculoskeletal:  Positive for gait problem.  Skin:  Positive for wound. Negative for rash.  Neurological:  Negative for headaches.   Physical Exam Updated Vital Signs BP 110/81 (BP Location: Right Arm)   Pulse (!)  109   Temp 99.4 F (37.4 C) (Oral)   SpO2 95%   Physical Exam Vitals and nursing note reviewed.  Constitutional:      Appearance: Normal appearance. He is well-developed.  HENT:     Head: Normocephalic.     Comments: He has a healing laceration on his nose and forehead. Eyes:     Conjunctiva/sclera: Conjunctivae normal.  Cardiovascular:     Rate and Rhythm: Regular rhythm. Tachycardia present.     Heart sounds: No murmur heard. Pulmonary:     Effort: Pulmonary effort is normal. No respiratory distress.     Breath sounds: Wheezing present.  Abdominal:     Palpations: Abdomen is soft.     Tenderness: There is no abdominal tenderness.  Musculoskeletal:        General: Tenderness present. Normal range of motion.     Cervical back: Neck supple.     Comments: He has diffuse tenderness over his hips and knees bilaterally.  No gross shortening or rotation.  He has a large blister in his right palm.  Skin:    General: Skin is warm and dry.  Neurological:     General: No focal deficit present.     Mental Status: He is alert.     Comments: He is moving all extremities and does not demonstrate any focal deficits.  No weakness appreciated    ED Results / Procedures / Treatments   Labs (all labs ordered are listed, but only abnormal results are displayed) Labs Reviewed  COMPREHENSIVE METABOLIC PANEL - Abnormal; Notable for the following components:      Result Value   Sodium 125 (*)    Potassium 3.0 (*)    Chloride 89 (*)    Glucose, Bld 110 (*)    Calcium 8.5 (*)    Albumin 3.4 (*)    AST 265 (*)    ALT 86 (*)    Total Bilirubin 1.4 (*)    All other components within normal limits  CBC WITH DIFFERENTIAL/PLATELET - Abnormal; Notable for the following components:   WBC 13.7 (*)    RBC 3.86 (*)    HCT 38.2 (*)    MCH 36.3 (*)    MCHC 36.6 (*)    Platelets 86 (*)    Neutro Abs 11.7 (*)    Lymphs Abs 0.4 (*)    Monocytes Absolute 1.4 (*)    Abs Immature Granulocytes 0.12  (*)    All other components within normal limits  CK - Abnormal; Notable for the following components:   Total CK 5,981 (*)    All other components within normal limits  RESP PANEL BY RT-PCR (FLU A&B, COVID) ARPGX2  ETHANOL  MAGNESIUM  HEPATITIS PANEL, ACUTE  RENAL FUNCTION PANEL  RENAL FUNCTION PANEL  OSMOLALITY, URINE  SODIUM, URINE, RANDOM    EKG None  Radiology DG Ribs Unilateral W/Chest Left  Result Date: 06/15/2021 CLINICAL DATA:  Fall.  Pain EXAM: LEFT RIBS AND CHEST - 3+ VIEW COMPARISON:  03/18/2017 FINDINGS: Stable cardiomediastinal contours. There are coarsened interstitial markings identified bilaterally. No airspace opacities. Two chronic appearing fractures noted involving the posterolateral aspect of the left ninth and tenth ribs. No acute displaced rib fractures identified. IMPRESSION: 1. No acute cardiopulmonary abnormalities. 2. Chronic appearing fractures noted involving the posterolateral aspect of the left ninth and tenth ribs. Electronically Signed   By: Signa Kell M.D.   On: 06/15/2021 13:26   DG Knee Complete 4 Views Left  Result Date: 06/15/2021 CLINICAL DATA:  Larey Seat.  Bilateral knee pain. EXAM: RIGHT KNEE - COMPLETE 4+ VIEW; LEFT KNEE - COMPLETE 4+ VIEW COMPARISON:  None. FINDINGS: The joint spaces are maintained. No acute fracture. No joint effusion. IMPRESSION: No acute bony findings or joint effusion. Electronically Signed   By: Rudie Meyer M.D.   On: 06/15/2021 13:26   DG Knee Complete 4 Views Right  Result Date: 06/15/2021 CLINICAL DATA:  Larey Seat.  Bilateral knee pain. EXAM: RIGHT KNEE - COMPLETE 4+ VIEW; LEFT KNEE - COMPLETE 4+ VIEW COMPARISON:  None. FINDINGS: The joint spaces are maintained. No acute fracture. No joint effusion. IMPRESSION: No acute bony findings or joint effusion. Electronically Signed   By: Rudie Meyer M.D.   On: 06/15/2021 13:26   DG Hip Unilat With Pelvis 2-3 Views Right  Result Date: 06/15/2021 CLINICAL DATA:  L.  Right hip  pain. EXAM: DG HIP (WITH OR WITHOUT PELVIS) 2-3V RIGHT COMPARISON:  None. FINDINGS: Both hips are normally located. Mild degenerative changes. No acute fracture. The pubic symphysis and SI joints are intact. No pelvic fractures or bone lesions. IMPRESSION: Mild degenerative changes but no acute bony findings. Electronically Signed   By: Rudie Meyer M.D.   On: 06/15/2021 13:24    Procedures Procedures   Medications Ordered in ED Medications  0.9 %  sodium chloride infusion (  Intravenous Infusion Verify 06/15/21 1635)  pneumococcal 23 valent vaccine (PNEUMOVAX-23) injection 0.5 mL (has no administration in time range)  albuterol (VENTOLIN HFA) 108 (90 Base) MCG/ACT inhaler 2 puff (2 puffs Inhalation Given 06/15/21 1202)  oxyCODONE-acetaminophen (PERCOCET/ROXICET) 5-325 MG per tablet 1 tablet (1 tablet Oral Given 06/15/21 1202)  sodium chloride 0.9 % bolus 500 mL (0 mLs Intravenous Stopped 06/15/21 1321)  sodium chloride 0.9 % bolus 1,000 mL (1,000 mLs Intravenous Bolus 06/15/21 1350)  potassium chloride SA (KLOR-CON) CR tablet 40 mEq (40 mEq Oral Given 06/15/21 1407)  thiamine (B-1) 250 mg in sodium chloride 0.9 % 50 mL IVPB (0 mg Intravenous Stopped 06/15/21 1611)    ED Course  I have reviewed the triage vital signs and the nursing notes.  Pertinent labs & imaging results that were available during my care of the patient were reviewed by me and considered in my medical decision making (see chart for details).  Clinical Course as of 06/15/21 1645  Tue Jun 15, 2021  1344 CK came back at 5900.  Renal function preserved.  Labs with hyponatremia hypokalemia.  Platelets down question secondary to alcohol use.  Alcohol level negative here. [MB]  1357 Discussed with [facility] hospitalist Dr. Ronaldo MiyamotoKyle.  He will evaluate the patient for admission.  Asked for magnesium to be added on. [MB]    Clinical Course User Index [MB] Terrilee FilesButler, Brayam Boeke C, MD   MDM Rules/Calculators/A&P                         This patient  complains of generalized weakness falls pain all over; this involves an extensive number of treatment Options and is a complaint that carries with it a high risk of complications and Morbidity. The differential includes dehydration, renal failure, metabolic derangement, rhabdomyolysis, fracture, contusion, spinal injury  I ordered, reviewed and interpreted labs, which included CBC with mildly elevated white count, stable hemoglobin, chemistries with low sodium low potassium low chloride, elevations in LFTs, magnesium normal, COVID and flu testing negative.  CPK markedly elevated at 5900 I ordered medication IV fluids and oral pain medication, oral potassium repletion I ordered imaging studies which included chest x-ray and left ribs, pelvis and right hip, bilateral knees and I independently    visualized and interpreted imaging which showed no acute fractures Additional history obtained from EMS Previous records obtained and reviewed in epic, no recent admissions I consulted [facility] hospitalist Dr. Ronaldo MiyamotoKyle and discussed lab and imaging findings  Critical Interventions: None  After the interventions stated above, I reevaluated the patient and found patient to be neurologically intact.  Hemodynamically stable.  He will need admission to the hospital for further management of his symptoms.  Cleda DaubWilliam T Short was evaluated in Emergency Department on 06/15/2021 for the symptoms described in the history of present illness. He was evaluated in the context of the global COVID-19 pandemic, which necessitated consideration that the patient might be at risk for infection with the SARS-CoV-2 virus that causes COVID-19. Institutional protocols and algorithms that pertain to the evaluation of patients at risk for COVID-19 are in a state of rapid change based on information released by regulatory bodies including the CDC and federal and state organizations. These policies and algorithms were followed during the  patient's care in the ED.   Final Clinical Impression(s) / ED Diagnoses Final diagnoses:  Frequent falls  Traumatic rhabdomyolysis, initial encounter (HCC)  Hyponatremia  Hypokalemia    Rx / DC Orders ED Discharge Orders  None        Terrilee Files, MD 06/15/21 [phone]

## 2021-06-15 NOTE — H&P (Signed)
History and Physical    Joseph Bell XAJ:[id number] DOB: 03-06-1961 DOA: 06/15/2021  PCP: Lavinia Sharps, NP  Patient [place] from: streets  Chief Complaint: "my legs are weak"  HPI: Joseph Bell is a 60 y.o. male with medical history significant of EtOH abuse. Presenting with altered mental status. Patient is a poor historian. History is from chart review. He apparently has had multiple falls recent. Yesterday he was found down by EMS. It was recommended that he go to the ED but he refused. He apparently had another fall overnight. EMS found him today and brought him to the ED.   ED Course: He was found to be hyponatremic. He was found to have an elevated CPK. Pain was eval'd by imaging, which did not reveal any acute fractures. He was given fluids. TR[facility] was called for admission.   Review of Systems:  He reports foot pain. Denies CP, dyspnea, palpitations, N/V/D. Review of systems is otherwise negative for all not mentioned in HPI.   PMHx Past Medical History:  Diagnosis Date   ETOH abuse     PSHx He denies any past surgeries  SocHx  reports that he has been smoking. He does not have any smokeless tobacco history on file. He reports current alcohol use. He reports that he does not use drugs.  No Known Allergies  FamHx He is unable to report family history  Prior to Admission medications   Not on File    Physical Exam: Vitals:   06/15/21 1130 06/15/21 1131 06/15/21 1200 06/15/21 1315  BP: 110/81  119/76 124/77  Pulse: (!) 104  (!) 104 96  Resp: 16  16 16   Temp:      TempSrc:      SpO2: 96%  96% 95%  Weight:  81.6 kg    Height:  5\' 5"  (1.651 m)      General: 60 y.o. male resting in bed; disheveled appearance Eyes: PERRL, normal sclera ENMT: Nares patent w/o discharge, orophaynx clear, dentition normal, ears w/o discharge/lesions/ulcers; abrasions across forehead Neck: Supple, trachea midline Cardiovascular: RRR, +S1, S2, no m/g/r, equal pulses  throughout Respiratory: CTABL, no w/r/r, normal WOB GI: BS+, NDNT, no masses noted, no organomegaly noted MSK: No e/c/c Neuro: A&O x name/place, no focal deficits Psyc: Odd affect, confused, but pleasant, cooperative  Labs on Admission: I have personally reviewed following labs and imaging studies  CBC: Recent Labs  Lab 06/15/21 1143  WBC 13.7*  NEUTROABS 11.7*  HGB 14.0  HCT 38.2*  MCV 99.0  PLT 86*   Basic Metabolic Panel: Recent Labs  Lab 06/15/21 1143  NA 125*  K 3.0*  CL 89*  CO2 24  GLUCOSE 110*  BUN 15  CREATININE 0.70  CALCIUM 8.5*   GFR: Estimated Creatinine Clearance: 96.5 mL/min (by C-G formula based on SCr of 0.7 mg/dL). Liver Function Tests: Recent Labs  Lab 06/15/21 1143  AST 265*  ALT 86*  ALKPHOS 44  BILITOT 1.4*  PROT 6.8  ALBUMIN 3.4*   No results for input(s): LIPASE, AMYLASE in the last 168 hours. No results for input(s): AMMONIA in the last 168 hours. Coagulation Profile: No results for input(s): INR, PROTIME in the last 168 hours. Cardiac Enzymes: Recent Labs  Lab 06/15/21 1143  CKTOTAL 5,981*   BNP (last 3 results) No results for input(s): PROBNP in the last 8760 hours. HbA1C: No results for input(s): HGBA1C in the last 72 hours. CBG: No results for input(s): GLUCAP in the last 168 hours.  Lipid Profile: No results for input(s): CHOL, HDL, LDLCALC, TRIG, CHOLHDL, LDLDIRECT in the last 72 hours. Thyroid Function Tests: No results for input(s): TSH, T4TOTAL, FREET4, T3FREE, THYROIDAB in the last 72 hours. Anemia Panel: No results for input(s): VITAMINB12, FOLATE, FERRITIN, TIBC, IRON, RETICCTPCT in the last 72 hours. Urine analysis: No results found for: COLORURINE, APPEARANCEUR, LABSPEC, PHURINE, GLUCOSEU, HGBUR, BILIRUBINUR, KETONESUR, PROTEINUR, UROBILINOGEN, NITRITE, LEUKOCYTESUR  Radiological Exams on Admission: DG Ribs Unilateral W/Chest Left  Result Date: 06/15/2021 CLINICAL DATA:  Fall.  Pain EXAM: LEFT RIBS AND  CHEST - 3+ VIEW COMPARISON:  03/18/2017 FINDINGS: Stable cardiomediastinal contours. There are coarsened interstitial markings identified bilaterally. No airspace opacities. Two chronic appearing fractures noted involving the posterolateral aspect of the left ninth and tenth ribs. No acute displaced rib fractures identified. IMPRESSION: 1. No acute cardiopulmonary abnormalities. 2. Chronic appearing fractures noted involving the posterolateral aspect of the left ninth and tenth ribs. Electronically Signed   By: Signa Kell M.D.   On: 06/15/2021 13:26   DG Knee Complete 4 Views Left  Result Date: 06/15/2021 CLINICAL DATA:  Larey Seat.  Bilateral knee pain. EXAM: RIGHT KNEE - COMPLETE 4+ VIEW; LEFT KNEE - COMPLETE 4+ VIEW COMPARISON:  None. FINDINGS: The joint spaces are maintained. No acute fracture. No joint effusion. IMPRESSION: No acute bony findings or joint effusion. Electronically Signed   By: Rudie Meyer M.D.   On: 06/15/2021 13:26   DG Knee Complete 4 Views Right  Result Date: 06/15/2021 CLINICAL DATA:  Larey Seat.  Bilateral knee pain. EXAM: RIGHT KNEE - COMPLETE 4+ VIEW; LEFT KNEE - COMPLETE 4+ VIEW COMPARISON:  None. FINDINGS: The joint spaces are maintained. No acute fracture. No joint effusion. IMPRESSION: No acute bony findings or joint effusion. Electronically Signed   By: Rudie Meyer M.D.   On: 06/15/2021 13:26   DG Hip Unilat With Pelvis 2-3 Views Right  Result Date: 06/15/2021 CLINICAL DATA:  L.  Right hip pain. EXAM: DG HIP (WITH OR WITHOUT PELVIS) 2-3V RIGHT COMPARISON:  None. FINDINGS: Both hips are normally located. Mild degenerative changes. No acute fracture. The pubic symphysis and SI joints are intact. No pelvic fractures or bone lesions. IMPRESSION: Mild degenerative changes but no acute bony findings. Electronically Signed   By: Rudie Meyer M.D.   On: 06/15/2021 13:24    EKG: None obtained in ED  Assessment/Plan Acute metabolic encephalopathy    - likely multifactorial      - check UDS     - given EtOH history; will cover for Wernicke's w/ high dose thiamine  BLE weakness      - no weakness on exam, but he says he is unable to really walk      - PT/OT consult      - will check thiamine, B12 levels      - give high dose thiamine today   Hyponatremia     - NS, check Uosm, UNa+     - q6 hour renal fxn panels   Hx of EtOH abuse Elevated LFTs     - CIWA, check hepatitis panel     - counseled against further use of EtOH  Rhabdomyolysis     - fluids, follow  Thrombocytopenia     - secondary to EtOH abuse and liver dysfxn?     - follow     - SCDs for PPx  Hypokalemia     - replace K+, Mg2+ is ok  DVT prophylaxis: SCDs  Code Status: FULL  Family  Communication: None at bedside  Consults called: None  Status is: Inpatient  Remains inpatient appropriate because:Persistent severe electrolyte disturbances  Dispo: The patient is from: Home              Anticipated d/c is to: Home              Patient currently is not medically stable to d/c.   Difficult to place patient No  Time spent coordinating admission: 45 minutes  Braxden Lovering A Arly Salminen DO [facility]  If 7PM-7AM, please contact night-coverage [email or web address]  06/15/2021, 1:55 PM

## 2021-06-15 NOTE — Sepsis Progress Note (Signed)
Notified bedside nurse of need to administer antibiotics.  

## 2021-06-15 NOTE — ED Triage Notes (Addendum)
Pt BIB [facility]. Pt fell yesterday outside at unknown location. PT is homeless and has a hx of hypertension. Pt refused to go to hospital yesterday when he fell, [facility] picked him up from between two houses today.Pt has wound in his right palm. Pt rates his pain a 9.    142/84 80-HR

## 2021-06-15 NOTE — Progress Notes (Signed)
Pharmacy Antibiotic Note  Joseph Bell is a 60 y.o. male admitted on 06/15/2021 with sepsis.  Pharmacy has been consulted for vanc/cefepime dosing.  Plan: Vanc 1750mg  IV x 1 then 1g IV q12 Cefepime 2g IV q8 per current renal function  Height: 5\' 5"  (165.1 cm) Weight: 81.6 kg (179 lb 14.3 oz) IBW/kg (Calculated) : 61.5  Temp (24hrs), Avg:100.3 F (37.9 C), Min:98.7 F (37.1 C), Max:102.1 F (38.9 C)  Recent Labs  Lab 06/15/21 1143  WBC 13.7*  CREATININE 0.70    Estimated Creatinine Clearance: 96.5 mL/min (by C-G formula based on SCr of 0.7 mg/dL).    No Known Allergies    Thank you for allowing pharmacy to be a part of this patient's care.  06/15/2021 6:30 PM

## 2021-06-16 ENCOUNTER — Inpatient Hospital Stay (HOSPITAL_COMMUNITY): Payer: Self-pay

## 2021-06-16 ENCOUNTER — Encounter (HOSPITAL_COMMUNITY): Payer: Self-pay | Admitting: Internal Medicine

## 2021-06-16 DIAGNOSIS — R296 Repeated falls: Secondary | ICD-10-CM

## 2021-06-16 LAB — BLOOD CULTURE ID PANEL (REFLEXED) - BCID2

## 2021-06-16 LAB — HEPATIC FUNCTION PANEL
ALT: 68 U/L — ABNORMAL HIGH (ref 0–44)
AST: 168 U/L — ABNORMAL HIGH (ref 15–41)
Albumin: 2.6 g/dL — ABNORMAL LOW (ref 3.5–5.0)
Alkaline Phosphatase: 40 U/L (ref 38–126)
Bilirubin, Direct: 0.3 mg/dL — ABNORMAL HIGH (ref 0.0–0.2)
Indirect Bilirubin: 0.5 mg/dL (ref 0.3–0.9)
Total Bilirubin: 0.8 mg/dL (ref 0.3–1.2)
Total Protein: 5.8 g/dL — ABNORMAL LOW (ref 6.5–8.1)

## 2021-06-16 LAB — RENAL FUNCTION PANEL
Albumin: 2.8 g/dL — ABNORMAL LOW (ref 3.5–5.0)
Albumin: 2.6 g/dL — ABNORMAL LOW (ref 3.5–5.0)
Anion gap: 9 (ref 5–15)
Anion gap: 7 (ref 5–15)
BUN: 11 mg/dL (ref 6–20)
BUN: 10 mg/dL (ref 6–20)
CO2: 22 mmol/L (ref 22–32)
CO2: 24 mmol/L (ref 22–32)
Calcium: 7.8 mg/dL — ABNORMAL LOW (ref 8.9–10.3)
Calcium: 7.8 mg/dL — ABNORMAL LOW (ref 8.9–10.3)
Chloride: 97 mmol/L — ABNORMAL LOW (ref 98–111)
Chloride: 97 mmol/L — ABNORMAL LOW (ref 98–111)
Creatinine, Ser: 0.56 mg/dL — ABNORMAL LOW (ref 0.61–1.24)
Creatinine, Ser: 0.48 mg/dL — ABNORMAL LOW (ref 0.61–1.24)
GFR, Estimated: >60 mL/min (ref >60)
GFR, Estimated: >60 mL/min (ref >60)
Glucose, Bld: 101 mg/dL — ABNORMAL HIGH (ref 70–99)
Glucose, Bld: 113 mg/dL — ABNORMAL HIGH (ref 70–99)
Phosphorus: 2.5 mg/dL (ref 2.5–4.6)
Phosphorus: 2.6 mg/dL (ref 2.5–4.6)
Potassium: 3 mmol/L — ABNORMAL LOW (ref 3.5–5.1)
Potassium: 3.3 mmol/L — ABNORMAL LOW (ref 3.5–5.1)
Sodium: 128 mmol/L — ABNORMAL LOW (ref 135–145)
Sodium: 128 mmol/L — ABNORMAL LOW (ref 135–145)

## 2021-06-16 LAB — DIC (DISSEMINATED INTRAVASCULAR COAGULATION)PANEL
D-Dimer, Quant: 5.64 ug/mL-FEU — ABNORMAL HIGH (ref 0.00–0.50)
Fibrinogen: 607 mg/dL — ABNORMAL HIGH (ref 210–475)
INR: 1.1 (ref 0.8–1.2)
Platelets: 80 10*3/uL — ABNORMAL LOW (ref 150–400)
Prothrombin Time: 14.1 seconds (ref 11.4–15.2)
Smear Review: NONE SEEN
aPTT: 38 seconds — ABNORMAL HIGH (ref 24–36)

## 2021-06-16 LAB — CBC
HCT: 39 % (ref 39.0–52.0)
Hemoglobin: 13.5 g/dL (ref 13.0–17.0)
MCH: 36.6 pg — ABNORMAL HIGH (ref 26.0–34.0)
MCHC: 34.6 g/dL (ref 30.0–36.0)
MCV: 105.7 fL — ABNORMAL HIGH (ref 80.0–100.0)
Platelets: 78 10*3/uL — ABNORMAL LOW (ref 150–400)
RBC: 3.69 MIL/uL — ABNORMAL LOW (ref 4.22–5.81)
RDW: 11.9 % (ref 11.5–15.5)
WBC: 11.5 10*3/uL — ABNORMAL HIGH (ref 4.0–10.5)
nRBC: 0 % (ref 0.0–0.2)

## 2021-06-16 LAB — RAPID URINE DRUG SCREEN, HOSP PERFORMED
Amphetamines: NOT DETECTED
Barbiturates: NOT DETECTED
Benzodiazepines: NOT DETECTED
Cocaine: NOT DETECTED
Opiates: POSITIVE — AB
Tetrahydrocannabinol: NOT DETECTED

## 2021-06-16 LAB — OSMOLALITY: Osmolality: 270 mOsm/kg — ABNORMAL LOW (ref 275–295)

## 2021-06-16 LAB — PROCALCITONIN: Procalcitonin: 1.23 ng/mL

## 2021-06-16 LAB — VITAMIN B12: Vitamin B-12: 132 pg/mL — ABNORMAL LOW (ref 180–914)

## 2021-06-16 LAB — T4, FREE: Free T4: 0.79 ng/dL (ref 0.61–1.12)

## 2021-06-16 LAB — TSH: TSH: 1.625 u[IU]/mL (ref 0.350–4.500)

## 2021-06-16 MED ORDER — CYANOCOBALAMIN 1000 MCG/ML IJ SOLN
1000.0000 ug | Freq: Once | INTRAMUSCULAR | Status: AC
  Administered 2021-06-17: 1000 ug via SUBCUTANEOUS
  Filled 2021-06-16: qty 1

## 2021-06-16 MED ORDER — CEFAZOLIN SODIUM-DEXTROSE 2-4 GM/100ML-% IV SOLN
2.0000 g | Freq: Three times a day (TID) | INTRAVENOUS | Status: DC
  Administered 2021-06-16: 2 g via INTRAVENOUS
  Filled 2021-06-16 (×2): qty 100

## 2021-06-16 MED ORDER — SODIUM CHLORIDE 0.9 % IV SOLN
2.0000 g | Freq: Three times a day (TID) | INTRAVENOUS | Status: AC
  Administered 2021-06-16 – 2021-06-22 (×19): 2 g via INTRAVENOUS
  Filled 2021-06-16 (×19): qty 2

## 2021-06-16 NOTE — Plan of Care (Signed)
  Problem: Activity: Goal: Risk for activity intolerance will decrease Outcome: Progressing   Problem: Nutrition: Goal: Adequate nutrition will be maintained Outcome: Progressing   Problem: Coping: Goal: Level of anxiety will decrease Outcome: Progressing   

## 2021-06-16 NOTE — TOC Initial Note (Signed)
Transition of Care [facility]) - Initial/Assessment Note    Patient Details  Name: Joseph Bell MRN: [id number] Date of Birth: 27-Feb-1961  Transition of Care [facility]) CM/SW Contact:    Ida Rogue, LCSW Phone Number: 06/16/2021, 11:31 AM  Clinical Narrative:   Patient seen in follow up to MD consult re: alcohol use.   Joseph Bell looks rough with multiple abrasions on his head.  States he fell off of a chair.  He had been staying on a friend's front porch, prior to that in a tent in a friend's back yard.  Neither of those is an option for him at d/c, and he is not sure where he will go.  Does not want to go to a shelter due to "the drama." He states he currently gets MCD, but no disability, no income.  Does not panhandle.  Is dependent on periodic financial help from some friends and a food truck that has been willing to give him food.  [place] Spaniel he has been increasingly weak recently, making it difficult to walk anywhere.  He denied alcohol use to me, stating he knows it is bad for him.  He asked for help with applying for disability.  I contacted financial navigator, who stated he was already referred to  [facility] who will screen him while he is here in the hospital. TOC will continue to follow during the course of hospitalization.              Expected Discharge Plan: Home/Self Care Barriers to Discharge: No Barriers Identified   Patient Goals and CMS Choice        Expected Discharge Plan and Services Expected Discharge Plan: Home/Self Care In-house Referral: Clinical Social Work     Living arrangements for the past 2 months: No permanent address                                      Prior Living Arrangements/Services Living arrangements for the past 2 months: No permanent address Lives with:: Self Patient language and need for interpreter reviewed:: Yes        Need for Family Participation in Patient Care: No (Comment) Care giver support system in place?: No  (comment)   Criminal Activity/Legal Involvement Pertinent to Current Situation/Hospitalization: No - Comment as needed  Activities of Daily Living Home Assistive Devices/Equipment: Eyeglasses (reading glases) ADL Screening (condition at time of admission) Patient's cognitive ability adequate to safely complete daily activities?: No Is the patient deaf or have difficulty hearing?: No Does the patient have difficulty seeing, even when wearing glasses/contacts?: No Does the patient have difficulty concentrating, remembering, or making decisions?: Yes (pt very sleepy and keeps falling asleep during intervies) Patient able to express need for assistance with ADLs?: Yes Does the patient have difficulty dressing or bathing?: Yes Independently performs ADLs?: Yes (appropriate for developmental age) Does the patient have difficulty walking or climbing stairs?: Yes Weakness of Legs: Both Weakness of Arms/Hands: Both  Permission Sought/Granted                  Emotional Assessment Appearance:: Appears stated age Attitude/Demeanor/Rapport: Engaged Affect (typically observed): Appropriate Orientation: : Oriented to Self, Oriented to Place, Oriented to Situation Alcohol / Substance Use: Other (comment) (denies use) Psych Involvement: No (comment)  Admission diagnosis:  Hypokalemia [E87.6] Hyponatremia [E87.1] Frequent falls [R29.6] Traumatic rhabdomyolysis, initial encounter (HCC) [T79.6XXA] Patient Active Problem List  Diagnosis Date Noted   Hyponatremia 06/15/2021   PCP:  Lavinia Sharps, NP Pharmacy:   [facility] - [place], [place] - [address] [place] [address] Phone: [phone] Fax: [phone]     Social Determinants of Health (SDOH) Interventions    Readmission Risk Interventions No flowsheet data found.

## 2021-06-16 NOTE — Progress Notes (Signed)
[facility] Progress Note  Patient: Joseph Bell    GBT:[id number]  DOA: 06/15/2021     Date of Service: the patient was seen and examined on 06/16/2021  Brief hospital course: Past medical history of alcohol abuse.  Does not see any physician on a regular basis.  Presents with complaints of generalized weakness. Review of systems still limited as the patient is confused. Found to have staph aureus bacteremia and hyponatremia with rhabdomyolysis Currently plan is IV antibiotics and further work-up for bacteremia.  Subjective: Denies any acute complaint.  Reports lower chest upper abdomen pain.  No nausea no vomiting.  Had a bowel movement.  Tolerating diet.  Assessment and Plan: 1.  Staph aureus bacteremia Suspect MSSA. Etiology of the bacteremia still not clear. Patient presented with generalized weakness and confusion without any evidence of sepsis. Continue with IV antibiotics switched to cefazolin. Will require follow-up culture as well as further work-up including echocardiogram. Monitor for now.  2.  Acute metabolic encephalopathy Generalized weakness Likely multifactorial  Infection alcohol nutritional deficiency  Monitor for now.  BLE weakness no weakness on exam, but he says he is unable to really walk PT/OT consult  Hyponatremia Gradually improving monitor   Hx of EtOH abuse Elevated LFTs Continue CIWA, counseled against further use of EtOH   Non-traumatic Rhabdomyolysis Treat with fluids, follow CK   Thrombocytopenia secondary to EtOH abuse and liver dysfxn? monitor   Hypokalemia Replaced   Scheduled Meds:  ceFAZolin (ANCEF) IVPB 2 gram/100 mL NS (Mini-Bag Plus)  2 g Intravenous Q8H   folic acid  1 mg Oral Daily   multivitamin with minerals  1 tablet Oral Daily   pneumococcal 23 valent vaccine  0.5 mL Intramuscular Tomorrow-1000   potassium chloride  40 mEq Oral Daily   thiamine  100 mg Oral Daily   Or   thiamine  100 mg Intravenous  Daily   Continuous Infusions:  sodium chloride 100 mL/hr at 06/16/21 0332   PRN Meds: acetaminophen **OR** acetaminophen, HYDROcodone-acetaminophen, LORazepam **OR** LORazepam, ondansetron **OR** ondansetron (ZOFRAN) IV  Body mass index is 29.94 kg/m.        DVT Prophylaxis:   SCDs Start: 06/15/21 1735    Advance goals of care discussion: Pt is Full code.  Family Communication: no family was present at bedside, at the time of interview.   Data Reviewed: I have personally reviewed and interpreted daily labs, tele strips, imaging. Sodium improving now stable. Potassium level improving. Serum creatinine stable Elevated LFTs Improving.  Osmolality 270  Physical Exam:  General: Appear in mild distress, no Rash; Oral Mucosa Clear, moist. no Abnormal Neck Mass Or lumps, Conjunctiva normal  Cardiovascular: S1 and S2 Present, no Murmur, Respiratory: good respiratory effort, Bilateral Air entry present and CTA, no Crackles, no wheezes Abdomen: Bowel Sound present, Soft and no tenderness Extremities: no Pedal edema Neurology: alert and oriented to place and person affect appropriate. no new focal deficit Gait not checked due to patient safety concerns  Vitals:   06/16/21 0037 06/16/21 0456 06/16/21 0905 06/16/21 1342  BP: (!) 126/112 94/75 115/81 109/80  Pulse: 75 66 67 80  Resp: 20 20 20 20   Temp: 97.6 F (36.4 C) 98.9 F (37.2 C) (!) 97.5 F (36.4 C) (!) 97.3 F (36.3 C)  TempSrc: Oral   Oral  SpO2: 93% 95% 97% 95%  Weight:      Height:        Disposition:  Status is: Inpatient  Remains inpatient appropriate because:Altered mental  status and Inpatient level of care appropriate due to severity of illness  Dispo: The patient is from: Home              Anticipated d/c is to: SNF              Patient currently is not medically stable to d/c.   Difficult to place patient No        Time spent: 35 minutes. I reviewed all nursing notes, pharmacy notes, vitals,  pertinent old records. I have discussed plan of care as described above with RN.  Author: Lynden Oxford, MD [facility] 06/16/2021 5:09 PM  To reach On-call, see care teams to locate the attending and reach out via [email or web address]. Between 7PM-7AM, please contact night-coverage If you still have difficulty reaching the attending provider, please page the [facility] (Director on Call) for [facility] on amion for assistance.

## 2021-06-16 NOTE — Progress Notes (Signed)
PHARMACY - PHYSICIAN COMMUNICATION CRITICAL VALUE ALERT - BLOOD CULTURE IDENTIFICATION (BCID)  Joseph Bell is an 60 y.o. male who presented to [facility] on 06/15/2021 with a chief complaint of weakness  Assessment:  weakness, encephalopathy, hyponatremia     Name of physician (or Provider) Contacted: Dr. Allena Katz   Current antibiotics: vancomycin and cefepime  Changes to prescribed antibiotics recommended:  DC Vancomycin and cefepime Start cefazolin 2 gr IV q8h as no resistance noted.  Results for orders placed or performed during the hospital encounter of 06/15/21  Blood Culture ID Panel (Reflexed) (Collected: 06/15/2021  5:56 PM)  Result Value Ref Range   Enterococcus faecalis NOT DETECTED NOT DETECTED   Enterococcus Faecium NOT DETECTED NOT DETECTED   Listeria monocytogenes NOT DETECTED NOT DETECTED   Staphylococcus species DETECTED (A) NOT DETECTED   Staphylococcus aureus (BCID) DETECTED (A) NOT DETECTED   Staphylococcus epidermidis NOT DETECTED NOT DETECTED   Staphylococcus lugdunensis NOT DETECTED NOT DETECTED   Streptococcus species NOT DETECTED NOT DETECTED   Streptococcus agalactiae NOT DETECTED NOT DETECTED   Streptococcus pneumoniae NOT DETECTED NOT DETECTED   Streptococcus pyogenes NOT DETECTED NOT DETECTED   A.calcoaceticus-baumannii NOT DETECTED NOT DETECTED   Bacteroides fragilis NOT DETECTED NOT DETECTED   Enterobacterales NOT DETECTED NOT DETECTED   Enterobacter cloacae complex NOT DETECTED NOT DETECTED   Escherichia coli NOT DETECTED NOT DETECTED   Klebsiella aerogenes NOT DETECTED NOT DETECTED   Klebsiella oxytoca NOT DETECTED NOT DETECTED   Klebsiella pneumoniae NOT DETECTED NOT DETECTED   Proteus species NOT DETECTED NOT DETECTED   Salmonella species NOT DETECTED NOT DETECTED   Serratia marcescens NOT DETECTED NOT DETECTED   Haemophilus influenzae NOT DETECTED NOT DETECTED   Neisseria meningitidis NOT DETECTED NOT DETECTED   Pseudomonas aeruginosa  NOT DETECTED NOT DETECTED   Stenotrophomonas maltophilia NOT DETECTED NOT DETECTED   Candida albicans NOT DETECTED NOT DETECTED   Candida auris NOT DETECTED NOT DETECTED   Candida glabrata NOT DETECTED NOT DETECTED   Candida krusei NOT DETECTED NOT DETECTED   Candida parapsilosis NOT DETECTED NOT DETECTED   Candida tropicalis NOT DETECTED NOT DETECTED   Cryptococcus neoformans/gattii NOT DETECTED NOT DETECTED   Meth resistant mecA/C and MREJ NOT DETECTED NOT DETECTED    Adalberto Cole, PharmD, BCPS 06/16/2021 12:50 PM

## 2021-06-16 NOTE — Consult Note (Signed)
[facility]  Total days of antibiotics 2/vanco & cefazolin Reason for Consult:mssa bacteremia   Referring Physician: patel  Active Problems:   Hyponatremia    HPI: Joseph Bell is a 60 y.o. male with hx of ETOH abuse and homelessness, he was admitted for lower extremity pain from fall. Found to have elevated CK, transaminitis, hyponatremia and aki. He also complained of cough and shortness of breath. CXR had retrocardiac infiltrate vs. Atelectesis. His infectious work up revealed that he had MSSA bacteremia. He was started on vanco and cefazolin. He complains of his ribcage hurting bilaterally from recent fall  Past Medical History:  Diagnosis Date   ETOH abuse     Allergies: No Known Allergies   MEDICATIONS:  folic acid  1 mg Oral Daily   multivitamin with minerals  1 tablet Oral Daily   pneumococcal 23 valent vaccine  0.5 mL Intramuscular Tomorrow-1000   potassium chloride  40 mEq Oral Daily   thiamine  100 mg Oral Daily   Or   thiamine  100 mg Intravenous Daily    Social History   Tobacco Use   Smoking status: Every Day    Pack years: 0.00  Substance Use Topics   Alcohol use: Yes   Drug use: No    No family history on file.  Review of Systems  Constitutional: positive for fever, chills, diaphoresis, activity change, appetite change, fatigue and unexpected weight change.  HENT: Negative for congestion, sore throat, rhinorrhea, sneezing, trouble swallowing and sinus pressure.  Eyes: Negative for photophobia and visual disturbance.  Respiratory: Negative for cough, chest tightness, shortness of breath, wheezing and stridor.  Cardiovascular: Negative for chest pain, palpitations and leg swelling.  Gastrointestinal: Negative for nausea, vomiting, abdominal pain, diarrhea, constipation, blood in stool, abdominal distention and anal bleeding.  Genitourinary: Negative for dysuria, hematuria, flank pain and difficulty urinating.   Musculoskeletal: positive for myalgias, back pain, joint swelling, arthralgias and gait problem.  Skin: Negative for color change, pallor, rash and wound.  Neurological: Negative for dizziness, tremors, weakness and light-headedness.  Hematological: Negative for adenopathy. Does not bruise/bleed easily.  Psychiatric/Behavioral: Negative for behavioral problems, confusion, sleep disturbance, dysphoric mood, decreased concentration and agitation.    OBJECTIVE: Temp:  [97.3 F (36.3 C)-102.1 F (38.9 C)] 97.3 F (36.3 C) (07/13 1342) Pulse Rate:  [66-115] 80 (07/13 1342) Resp:  [18-24] 20 (07/13 1342) BP: (94-144)/(61-112) 109/80 (07/13 1342) SpO2:  [90 %-98 %] 95 % (07/13 1342) Physical Exam  Constitutional: He is oriented to person, place. He appears well-developed and well-nourished. No distress.  HENT:  Mouth/Throat: Oropharynx is clear and moist. No oropharyngeal exudate.  Cardiovascular: Normal rate, regular rhythm and normal heart sounds. Exam reveals no gallop and no friction rub.  No murmur heard.  Pulmonary/Chest: Effort normal and breath sounds normal. No respiratory distress. He has no wheezes.  Abdominal: Soft. Bowel sounds are normal. He exhibits no distension. There is no tenderness.  Lymphadenopathy:  He has no cervical adenopathy.  Neurological: He is alert and oriented to person, place.  Skin: Skin is warm and dry. No rash noted. No erythema.  Psychiatric: He has a normal mood and affect. His behavior is normal.    LABS: Results for orders placed or performed during the hospital encounter of 06/15/21 (from the past 48 hour(s))  Comprehensive metabolic panel     Status: Abnormal   Collection Time: 06/15/21 11:43 AM  Result Value Ref Range   Sodium 125 (L)  135 - 145 mmol/L   Potassium 3.0 (L) 3.5 - 5.1 mmol/L   Chloride 89 (L) 98 - 111 mmol/L   CO2 24 22 - 32 mmol/L   Glucose, Bld 110 (H) 70 - 99 mg/dL    Comment: Glucose reference range applies only to samples  taken after fasting for at least 8 hours.   BUN 15 6 - 20 mg/dL   Creatinine, Ser 0.45 0.61 - 1.24 mg/dL   Calcium 8.5 (L) 8.9 - 10.3 mg/dL   Total Protein 6.8 6.5 - 8.1 g/dL   Albumin 3.4 (L) 3.5 - 5.0 g/dL   AST 409 (H) 15 - 41 U/L   ALT 86 (H) 0 - 44 U/L   Alkaline Phosphatase 44 38 - 126 U/L   Total Bilirubin 1.4 (H) 0.3 - 1.2 mg/dL   GFR, Estimated >81 >19 mL/min    Comment: (NOTE) Calculated using the CKD-EPI Creatinine Equation (2021)    Anion gap 12 5 - 15    Comment: Performed at [facility], [address], [place], [place] [address]  CBC with Differential     Status: Abnormal   Collection Time: 06/15/21 11:43 AM  Result Value Ref Range   WBC 13.7 (H) 4.0 - 10.5 K/uL   RBC 3.86 (L) 4.22 - 5.81 MIL/uL   Hemoglobin 14.0 13.0 - 17.0 g/dL   HCT 95.6 (L) 21.3 - 08.6 %   MCV 99.0 80.0 - 100.0 fL   MCH 36.3 (H) 26.0 - 34.0 pg   MCHC 36.6 (H) 30.0 - 36.0 g/dL   RDW 57.8 46.9 - 62.9 %   Platelets 86 (L) 150 - 400 K/uL    Comment: SPECIMEN CHECKED FOR CLOTS Immature Platelet Fraction may be clinically indicated, consider ordering this additional test [id number]    nRBC 0.0 0.0 - 0.2 %   Neutrophils Relative % 85 %   Neutro Abs 11.7 (H) 1.7 - 7.7 K/uL   Lymphocytes Relative 3 %   Lymphs Abs 0.4 (L) 0.7 - 4.0 K/uL   Monocytes Relative 11 %   Monocytes Absolute 1.4 (H) 0.1 - 1.0 K/uL   Eosinophils Relative 0 %   Eosinophils Absolute 0.0 0.0 - 0.5 K/uL   Basophils Relative 0 %   Basophils Absolute 0.0 0.0 - 0.1 K/uL   Immature Granulocytes 1 %   Abs Immature Granulocytes 0.12 (H) 0.00 - 0.07 K/uL    Comment: Performed at [facility], [address], [place], [place] [address]  Ethanol     Status: None   Collection Time: 06/15/21 11:43 AM  Result Value Ref Range   Alcohol, Ethyl (B) <10 <10 mg/dL    Comment: (NOTE) Lowest detectable limit for serum alcohol is 10 mg/dL.  For medical purposes only. Performed at [facility], [address], [place], [place] [address]   CK     Status: Abnormal   Collection Time: 06/15/21 11:43 AM  Result Value Ref Range   Total CK 5,981 (H) 49 - 397 U/L    Comment: RESULTS CONFIRMED BY MANUAL DILUTION CRITICAL RESULT CALLED TO, READ BACK BY AND VERIFIED WITH: HILL, L. RN @1338  06/15/21 Dorris Fetch Performed at [facility], [address] [place], [place] [address]   Magnesium     Status: None   Collection Time: 06/15/21 11:43 AM  Result Value Ref Range   Magnesium 1.9 1.7 - 2.4 mg/dL    Comment: Performed at [facility], [address] [place], [place] [address]  Resp Panel by RT-PCR (Flu A&B, Covid) Nasopharyngeal Swab     Status: None   Collection Time: 06/15/21  1:47 PM   Specimen: Nasopharyngeal Swab; Nasopharyngeal(NP) swabs in vial transport medium  Result Value Ref Range   SARS Coronavirus 2 by RT PCR NEGATIVE NEGATIVE    Comment: (NOTE) SARS-CoV-2 target nucleic acids are NOT DETECTED.  The SARS-CoV-2 RNA is generally detectable in upper respiratory specimens during the acute phase of infection. The lowest concentration of SARS-CoV-2 viral copies this assay can detect is 138 copies/mL. A negative result does not preclude SARS-Cov-2 infection and should not be used as the sole basis for treatment or other patient management decisions. A negative result may occur with  improper specimen collection/handling, submission of specimen other than nasopharyngeal swab, presence of viral mutation(s) within the areas targeted by this assay, and inadequate number of viral copies(<138 copies/mL). A negative result must be combined with clinical observations, patient history, and epidemiological information. The expected result is Negative.  Fact Sheet for Patients:  BloggerCourse.com  Fact Sheet for Healthcare Providers:  SeriousBroker.it  This test is no t  yet approved or cleared by the [place] FDA and  has been authorized for detection and/or diagnosis of SARS-CoV-2 by FDA under an Emergency Use Authorization (EUA). This EUA will remain  in effect (meaning this test can be used) for the duration of the COVID-19 declaration under Section 564(b)(1) of the Act, 21 U.S.C.section 360bbb-3(b)(1), unless the authorization is terminated  or revoked sooner.       Influenza A by PCR NEGATIVE NEGATIVE   Influenza B by PCR NEGATIVE NEGATIVE    Comment: (NOTE) The Xpert Xpress SARS-CoV-2/FLU/RSV plus assay is intended as an aid in the diagnosis of influenza from Nasopharyngeal swab specimens and should not be used as a sole basis for treatment. Nasal washings and aspirates are unacceptable for Xpert Xpress SARS-CoV-2/FLU/RSV testing.  Fact Sheet for Patients: BloggerCourse.com  Fact Sheet for Healthcare Providers: SeriousBroker.it  This test is not yet approved or cleared by the [place] FDA and has been authorized for detection and/or diagnosis of SARS-CoV-2 by FDA under an Emergency Use Authorization (EUA). This EUA will remain in effect (meaning this test can be used) for the duration of the COVID-19 declaration under Section 564(b)(1) of the Act, 21 U.S.C. section 360bbb-3(b)(1), unless the authorization is terminated or revoked.  Performed at [facility], [address] [place], [place] [address]   Hepatitis panel, acute     Status: Abnormal   Collection Time: 06/15/21  5:55 PM  Result Value Ref Range   Hepatitis B Surface Ag NON REACTIVE NON REACTIVE   HCV Ab Reactive (A) NON REACTIVE    Comment: (NOTE) The CDC recommends that a Reactive HCV antibody result be followed up  with a HCV Nucleic Acid Amplification test.     Hep A IgM NON REACTIVE NON REACTIVE   Hep B C IgM NON REACTIVE NON REACTIVE    Comment: Performed at [facility],  [address] [place], [place] [address]  CBC with Differential     Status: Abnormal   Collection Time: 06/15/21  5:55 PM  Result Value Ref Range   WBC 11.9 (H) 4.0 - 10.5 K/uL   RBC 4.03 (L) 4.22 - 5.81 MIL/uL   Hemoglobin 14.7 13.0 - 17.0 g/dL   HCT 21.3 08.6 - 57.8 %   MCV 103.2 (H) 80.0 - 100.0 fL  MCH 36.5 (H) 26.0 - 34.0 pg   MCHC 35.3 30.0 - 36.0 g/dL   RDW 09.8 11.9 - 14.7 %   Platelets 40 (L) 150 - 400 K/uL    Comment: SPECIMEN CHECKED FOR CLOTS Immature Platelet Fraction may be clinically indicated, consider ordering this additional test [id number] PLATELET COUNT CONFIRMED BY SMEAR    nRBC 0.0 0.0 - 0.2 %   Neutrophils Relative % 90 %   Neutro Abs 10.1 (H) 1.7 - 7.7 K/uL   Lymphocytes Relative 2 %   Lymphs Abs 0.2 (L) 0.7 - 4.0 K/uL   Monocytes Relative 7 %   Monocytes Absolute 0.8 0.1 - 1.0 K/uL   Eosinophils Relative 0 %   Eosinophils Absolute 0.0 0.0 - 0.5 K/uL   Basophils Relative 0 %   Basophils Absolute 0.0 0.0 - 0.1 K/uL   WBC Morphology VACUOLATED NEUTROPHILS    Immature Granulocytes 1 %   Abs Immature Granulocytes 0.16 (H) 0.00 - 0.07 K/uL    Comment: Performed at [facility], [address], [place], [place] [address]  Comprehensive metabolic panel     Status: Abnormal   Collection Time: 06/15/21  5:55 PM  Result Value Ref Range   Sodium 126 (L) 135 - 145 mmol/L   Potassium 3.2 (L) 3.5 - 5.1 mmol/L   Chloride 93 (L) 98 - 111 mmol/L   CO2 21 (L) 22 - 32 mmol/L   Glucose, Bld 94 70 - 99 mg/dL    Comment: Glucose reference range applies only to samples taken after fasting for at least 8 hours.   BUN 11 6 - 20 mg/dL   Creatinine, Ser 8.46 0.61 - 1.24 mg/dL   Calcium 8.1 (L) 8.9 - 10.3 mg/dL   Total Protein 6.7 6.5 - 8.1 g/dL   Albumin 3.1 (L) 3.5 - 5.0 g/dL   AST 962 (H) 15 - 41 U/L   ALT 85 (H) 0 - 44 U/L   Alkaline Phosphatase 59 38 - 126 U/L   Total Bilirubin 1.6 (H) 0.3 - 1.2 mg/dL   GFR, Estimated >95 >28 mL/min    Comment:  (NOTE) Calculated using the CKD-EPI Creatinine Equation (2021)    Anion gap 12 5 - 15    Comment: Performed at [facility], [address], [place], [place] [address]  Lactic acid, plasma     Status: None   Collection Time: 06/15/21  5:55 PM  Result Value Ref Range   Lactic Acid, Venous 1.6 0.5 - 1.9 mmol/L    Comment: Performed at [facility], [address] [place], [place] [address]  Protime-INR     Status: None   Collection Time: 06/15/21  5:55 PM  Result Value Ref Range   Prothrombin Time 13.2 11.4 - 15.2 seconds   INR 1.0 0.8 - 1.2    Comment: (NOTE) INR goal varies based on device and disease states. Performed at [facility], [address], [place], [place] [address]   APTT     Status: None   Collection Time: 06/15/21  5:55 PM  Result Value Ref Range   aPTT 34 24 - 36 seconds    Comment: Performed at [facility], [address], [place], [place] [address]  HIV Antibody (routine testing w rflx)     Status: None   Collection Time: 06/15/21  5:55 PM  Result Value Ref Range   HIV Screen 4th Generation wRfx Non Reactive Non Reactive  Comment: Performed at [facility], [address] [place], [place] [address]  Culture, blood (x 2)     Status: None (Preliminary result)   Collection Time: 06/15/21  5:56 PM   Specimen: BLOOD  Result Value Ref Range   Specimen Description      BLOOD RIGHT ANTECUBITAL Performed at [facility], [address] [place], [place] [address]    Special Requests      BOTTLES DRAWN AEROBIC AND ANAEROBIC Blood Culture adequate volume Performed at [facility], [address] [place], [place] [address]    Culture  Setup Time      GRAM POSITIVE COCCI IN CLUSTERS IN BOTH AEROBIC AND ANAEROBIC BOTTLES CRITICAL RESULT CALLED TO, READ BACK BY AND VERIFIED WITH: Thana AtesHARMD J GADHIA [id number] AT 1226 BY CM Performed at [facility], [address] [place], [place] [address]    Culture GRAM POSITIVE COCCI IN CLUSTERS    Report Status PENDING   Blood Culture ID Panel (Reflexed)     Status: Abnormal   Collection Time: 06/15/21  5:56 PM  Result Value Ref Range   Enterococcus faecalis NOT DETECTED NOT DETECTED   Enterococcus Faecium NOT DETECTED NOT DETECTED   Listeria monocytogenes NOT DETECTED NOT DETECTED   Staphylococcus species DETECTED (A) NOT DETECTED    Comment: CRITICAL RESULT CALLED TO, READ BACK BY AND VERIFIED WITH: PHARMD J GADHIA [id number] AT 1227 BY CM    Staphylococcus aureus (BCID) DETECTED (A) NOT DETECTED    Comment: CRITICAL RESULT CALLED TO, READ BACK BY AND VERIFIED WITH: PHARMD J GADHIA [id number] AT 1226 BY CM    Staphylococcus epidermidis NOT DETECTED NOT DETECTED   Staphylococcus lugdunensis NOT DETECTED NOT DETECTED   Streptococcus species NOT DETECTED NOT DETECTED   Streptococcus agalactiae NOT DETECTED NOT DETECTED   Streptococcus pneumoniae NOT DETECTED NOT DETECTED   Streptococcus pyogenes NOT DETECTED NOT DETECTED   A.calcoaceticus-baumannii NOT DETECTED NOT DETECTED   Bacteroides fragilis NOT DETECTED NOT DETECTED   Enterobacterales NOT DETECTED NOT DETECTED   Enterobacter cloacae complex NOT DETECTED NOT DETECTED   Escherichia coli NOT DETECTED NOT DETECTED   Klebsiella aerogenes NOT DETECTED NOT DETECTED   Klebsiella oxytoca NOT DETECTED NOT DETECTED   Klebsiella pneumoniae NOT DETECTED NOT DETECTED   Proteus species NOT DETECTED NOT DETECTED   Salmonella species NOT DETECTED NOT DETECTED   Serratia marcescens NOT DETECTED NOT DETECTED   Haemophilus influenzae NOT DETECTED NOT DETECTED   Neisseria meningitidis NOT DETECTED NOT DETECTED   Pseudomonas aeruginosa NOT DETECTED NOT DETECTED   Stenotrophomonas maltophilia NOT DETECTED NOT DETECTED   Candida albicans NOT DETECTED NOT DETECTED   Candida auris NOT DETECTED NOT DETECTED   Candida glabrata NOT DETECTED NOT DETECTED    Candida krusei NOT DETECTED NOT DETECTED   Candida parapsilosis NOT DETECTED NOT DETECTED   Candida tropicalis NOT DETECTED NOT DETECTED   Cryptococcus neoformans/gattii NOT DETECTED NOT DETECTED   Meth resistant mecA/C and MREJ NOT DETECTED NOT DETECTED    Comment: Performed at [facility], [address] [place], [place] [address]  Osmolality, urine     Status: None   Collection Time: 06/15/21  6:00 PM  Result Value Ref Range   Osmolality, Ur 751 300 - 900 mOsm/kg    Comment: Performed at [facility], [address] [place], [place] [address]  Sodium, urine, random     Status: None   Collection Time: 06/15/21  6:00 PM  Result Value Ref Range   Sodium, Ur 13 mmol/L    Comment: Performed at [facility], [address], [place], [place] [address]  Urinalysis, Routine w reflex microscopic     Status: Abnormal   Collection Time: 06/15/21  6:00 PM  Result Value Ref Range   Color, Urine AMBER (A) YELLOW    Comment: BIOCHEMICALS MAY BE AFFECTED BY COLOR   APPearance CLEAR CLEAR   Specific Gravity, Urine 1.021 1.005 - 1.030   pH 5.0 5.0 - 8.0   Glucose, UA NEGATIVE NEGATIVE mg/dL   Hgb urine dipstick LARGE (A) NEGATIVE   Bilirubin Urine NEGATIVE NEGATIVE   Ketones, ur 20 (A) NEGATIVE mg/dL   Protein, ur 604 (A) NEGATIVE mg/dL   Nitrite NEGATIVE NEGATIVE   Leukocytes,Ua NEGATIVE NEGATIVE   RBC / HPF 0-5 0 - 5 RBC/hpf   WBC, UA 0-5 0 - 5 WBC/hpf   Bacteria, UA NONE SEEN NONE SEEN   Squamous Epithelial / LPF 0-5 0 - 5   Hyaline Casts, UA PRESENT     Comment: Performed at [facility], [address], [place], [place] [address]  Culture, blood (x 2)     Status: None (Preliminary result)   Collection Time: 06/15/21  6:01 PM   Specimen: BLOOD  Result Value Ref Range   Specimen Description      BLOOD BLOOD LEFT HAND Performed at [facility], [address] [place], [place] [address]    Special Requests       BOTTLES DRAWN AEROBIC ONLY Blood Culture adequate volume Performed at [facility], [address], [place], [place] [address]    Culture  Setup Time      GRAM POSITIVE COCCI IN CLUSTERS AEROBIC BOTTLE ONLY CRITICAL VALUE NOTED.  VALUE IS CONSISTENT WITH PREVIOUSLY REPORTED AND CALLED VALUE. Performed at [facility], [address] [place], [place] [address]    Culture GRAM POSITIVE COCCI IN CLUSTERS    Report Status PENDING   Renal function panel     Status: Abnormal   Collection Time: 06/15/21  6:02 PM  Result Value Ref Range   Sodium 128 (L) 135 - 145 mmol/L   Potassium 3.2 (L) 3.5 - 5.1 mmol/L   Chloride 93 (L) 98 - 111 mmol/L   CO2 20 (L) 22 - 32 mmol/L   Glucose, Bld 91 70 - 99 mg/dL    Comment: Glucose reference range applies only to samples taken after fasting for at least 8 hours.   BUN 10 6 - 20 mg/dL   Creatinine, Ser 8.65 (L) 0.61 - 1.24 mg/dL   Calcium 8.1 (L) 8.9 - 10.3 mg/dL   Phosphorus 2.6 2.5 - 4.6 mg/dL   Albumin 3.2 (L) 3.5 - 5.0 g/dL   GFR, Estimated >78 >46 mL/min    Comment: (NOTE) Calculated using the CKD-EPI Creatinine Equation (2021)    Anion gap 15 5 - 15    Comment: Performed at [facility], [address] [place], [place] [address]  Lactic acid, plasma     Status: None   Collection Time: 06/15/21  7:18 PM  Result Value Ref Range   Lactic Acid, Venous 1.7 0.5 - 1.9 mmol/L    Comment: Performed at [facility], [address] [place], [place] [address]  CBC     Status: Abnormal   Collection Time: 06/16/21  5:34 AM  Result Value Ref Range  WBC 11.5 (H) 4.0 - 10.5 K/uL   RBC 3.69 (L) 4.22 - 5.81 MIL/uL   Hemoglobin 13.5 13.0 - 17.0 g/dL   HCT 16.1 09.6 - 04.5 %   MCV 105.7 (H) 80.0 - 100.0 fL   MCH 36.6 (H) 26.0 - 34.0 pg   MCHC 34.6 30.0 - 36.0 g/dL   RDW 40.9 81.1 - 91.4 %   Platelets 78 (L) 150 - 400 K/uL    Comment: Immature Platelet Fraction may be clinically indicated,  consider ordering this additional test [id number] CONSISTENT WITH PREVIOUS RESULT    nRBC 0.0 0.0 - 0.2 %    Comment: Performed at [facility], [address], [place], [place] [address]  Renal function panel     Status: Abnormal   Collection Time: 06/16/21  5:34 AM  Result Value Ref Range   Sodium 128 (L) 135 - 145 mmol/L   Potassium 3.0 (L) 3.5 - 5.1 mmol/L   Chloride 97 (L) 98 - 111 mmol/L   CO2 22 22 - 32 mmol/L   Glucose, Bld 101 (H) 70 - 99 mg/dL    Comment: Glucose reference range applies only to samples taken after fasting for at least 8 hours.   BUN 11 6 - 20 mg/dL   Creatinine, Ser 7.84 (L) 0.61 - 1.24 mg/dL   Calcium 7.8 (L) 8.9 - 10.3 mg/dL   Phosphorus 2.5 2.5 - 4.6 mg/dL   Albumin 2.8 (L) 3.5 - 5.0 g/dL   GFR, Estimated >69 >62 mL/min    Comment: (NOTE) Calculated using the CKD-EPI Creatinine Equation (2021)    Anion gap 9 5 - 15    Comment: Performed at [facility], [address] [place], [place] [address]  Renal function panel     Status: Abnormal   Collection Time: 06/16/21  8:11 AM  Result Value Ref Range   Sodium 128 (L) 135 - 145 mmol/L   Potassium 3.3 (L) 3.5 - 5.1 mmol/L   Chloride 97 (L) 98 - 111 mmol/L   CO2 24 22 - 32 mmol/L   Glucose, Bld 113 (H) 70 - 99 mg/dL    Comment: Glucose reference range applies only to samples taken after fasting for at least 8 hours.   BUN 10 6 - 20 mg/dL   Creatinine, Ser 1.32 (L) 0.61 - 1.24 mg/dL   Calcium 7.8 (L) 8.9 - 10.3 mg/dL   Phosphorus 2.6 2.5 - 4.6 mg/dL   Albumin 2.6 (L) 3.5 - 5.0 g/dL   GFR, Estimated >44 >01 mL/min    Comment: (NOTE) Calculated using the CKD-EPI Creatinine Equation (2021)    Anion gap 7 5 - 15    Comment: Performed at [facility], [address] [place], [place] [address]  DIC Panel ONCE - STAT     Status: Abnormal   Collection Time: 06/16/21  8:11 AM  Result Value Ref Range   Prothrombin Time 14.1 11.4 - 15.2 seconds    INR 1.1 0.8 - 1.2    Comment: (NOTE) INR goal varies based on device and disease states.    aPTT 38 (H) 24 - 36 seconds    Comment:        IF BASELINE aPTT IS ELEVATED, SUGGEST PATIENT RISK ASSESSMENT BE USED TO DETERMINE APPROPRIATE ANTICOAGULANT THERAPY.    Fibrinogen 607 (H) 210 - 475 mg/dL    Comment: (NOTE) Fibrinogen results may be underestimated in patients receiving thrombolytic therapy.    D-Dimer, Quant 5.64 (H)  0.00 - 0.50 ug/mL-FEU    Comment: (NOTE) At the manufacturer cut-off value of 0.5 g/mL FEU, this assay has a negative predictive value of 95-100%.This assay is intended for use in conjunction with a clinical pretest probability (PTP) assessment model to exclude pulmonary embolism (PE) and deep venous thrombosis (DVT) in outpatients suspected of PE or DVT. Results should be correlated with clinical presentation.    Platelets 80 (L) 150 - 400 K/uL    Comment: SPECIMEN CHECKED FOR CLOTS Immature Platelet Fraction may be clinically indicated, consider ordering this additional test [id number] CONSISTENT WITH PREVIOUS RESULT    Smear Review NO SCHISTOCYTES SEEN     Comment: Performed at [facility], [address] [place], [place] [address]  Hepatic function panel     Status: Abnormal   Collection Time: 06/16/21  8:11 AM  Result Value Ref Range   Total Protein 5.8 (L) 6.5 - 8.1 g/dL   Albumin 2.6 (L) 3.5 - 5.0 g/dL   AST 462 (H) 15 - 41 U/L   ALT 68 (H) 0 - 44 U/L   Alkaline Phosphatase 40 38 - 126 U/L   Total Bilirubin 0.8 0.3 - 1.2 mg/dL   Bilirubin, Direct 0.3 (H) 0.0 - 0.2 mg/dL   Indirect Bilirubin 0.5 0.3 - 0.9 mg/dL    Comment: Performed at [facility], [address] [place], [place] [address]  Procalcitonin - Baseline     Status: None   Collection Time: 06/16/21  8:11 AM  Result Value Ref Range   Procalcitonin 1.23 ng/mL    Comment:        Interpretation: PCT > 0.5 ng/mL and <= 2 ng/mL: Systemic  infection (sepsis) is possible, but other conditions are known to elevate PCT as well. (NOTE)       Sepsis PCT Algorithm           Lower Respiratory Tract                                      Infection PCT Algorithm    ----------------------------     ----------------------------         PCT < 0.25 ng/mL                PCT < 0.10 ng/mL          Strongly encourage             Strongly discourage   discontinuation of antibiotics    initiation of antibiotics    ----------------------------     -----------------------------       PCT 0.25 - 0.50 ng/mL            PCT 0.10 - 0.25 ng/mL               OR       >80% decrease in PCT            Discourage initiation of                                            antibiotics      Encourage discontinuation           of antibiotics    ----------------------------     -----------------------------         PCT >=  0.50 ng/mL              PCT 0.26 - 0.50 ng/mL                AND       <80% decrease in PCT             Encourage initiation of                                             antibiotics       Encourage continuation           of antibiotics    ----------------------------     -----------------------------        PCT >= 0.50 ng/mL                  PCT > 0.50 ng/mL               AND         increase in PCT                  Strongly encourage                                      initiation of antibiotics    Strongly encourage escalation           of antibiotics                                     -----------------------------                                           PCT <= 0.25 ng/mL                                                 OR                                        > 80% decrease in PCT                                      Discontinue / Do not initiate                                             antibiotics  Performed at [facility], [address] [place], [place] [address]   TSH     Status: None    Collection Time: 06/16/21  8:11 AM  Result Value Ref Range   TSH 1.625 0.350 - 4.500 uIU/mL    Comment: Performed by a 3rd Generation assay with a functional sensitivity of <=0.01 uIU/mL. Performed at [facility], [address] [place], [place] [address]   T4, free     Status: None   Collection Time: 06/16/21  8:11 AM  Result Value Ref Range   Free T4 0.79 0.61 - 1.12 ng/dL    Comment: (NOTE) Biotin ingestion may interfere with free T4 tests. If the results are inconsistent with the TSH level, previous test results, or the clinical presentation, then consider biotin interference. If needed, order repeat testing after stopping biotin. Performed at [facility], [address], [place], [place] [address]   Vitamin B12     Status: Abnormal   Collection Time: 06/16/21  8:11 AM  Result Value Ref Range   Vitamin B-12 132 (L) 180 - 914 pg/mL    Comment: (NOTE) This assay is not validated for testing neonatal or myeloproliferative syndrome specimens for Vitamin B12 levels. Performed at [facility], [address], [place], [place] [address]   Urine rapid drug screen (hosp performed)     Status: Abnormal   Collection Time: 06/16/21 12:41 PM  Result Value Ref Range   Opiates POSITIVE (A) NONE DETECTED   Cocaine NONE DETECTED NONE DETECTED   Benzodiazepines NONE DETECTED NONE DETECTED   Amphetamines NONE DETECTED NONE DETECTED   Tetrahydrocannabinol NONE DETECTED NONE DETECTED   Barbiturates NONE DETECTED NONE DETECTED    Comment: (NOTE) DRUG SCREEN FOR MEDICAL PURPOSES ONLY.  IF CONFIRMATION IS NEEDED FOR ANY PURPOSE, NOTIFY LAB WITHIN 5 DAYS.  LOWEST DETECTABLE LIMITS FOR URINE DRUG SCREEN Drug Class                     Cutoff (ng/mL) Amphetamine and metabolites    1000 Barbiturate and metabolites    200 Benzodiazepine                 200 Tricyclics and metabolites     300 Opiates and metabolites        300 Cocaine and  metabolites        300 THC                            50 Performed at [facility], [address], [place], [place] [address]     MICRO:  IMAGING: DG Ribs Unilateral W/Chest Left  Result Date: 06/15/2021 CLINICAL DATA:  Fall.  Pain EXAM: LEFT RIBS AND CHEST - 3+ VIEW COMPARISON:  03/18/2017 FINDINGS: Stable cardiomediastinal contours. There are coarsened interstitial markings identified bilaterally. No airspace opacities. Two chronic appearing fractures noted involving the posterolateral aspect of the left ninth and tenth ribs. No acute displaced rib fractures identified. IMPRESSION: 1. No acute cardiopulmonary abnormalities. 2. Chronic appearing fractures noted involving the posterolateral aspect of the left ninth and tenth ribs. Electronically Signed   By: Signa Kell M.D.   On: 06/15/2021 13:26   DG Chest Port 1 View  Result Date: 06/15/2021 CLINICAL DATA:  60 year old male male with medical history significant for alcohol abuse with altered mental status. EXAM: PORTABLE CHEST 1 VIEW COMPARISON:  June 15, 2021, earlier on the same date. FINDINGS: EKG leads project over the chest. Cardiomediastinal contours and hilar structures are normal. No sign of lobar consolidation or evidence of pleural effusion. Mild subtle airspace disease in the retrocardiac region. No pneumothorax. On limited assessment no acute skeletal process. Signs of prior trauma to the LEFT clavicle similar to previous imaging. IMPRESSION: Mild subtle airspace disease in the retrocardiac region,  potentially related to atelectasis, difficult to exclude developing infection. No lobar consolidation or sign of effusion. Signs of chronic fracture of LEFT posterolateral ribs as outlined on previous report. Electronically Signed   By: Donzetta Kohut M.D.   On: 06/15/2021 19:28   DG Knee Complete 4 Views Left  Result Date: 06/15/2021 CLINICAL DATA:  Larey Seat.  Bilateral knee pain. EXAM: RIGHT KNEE - COMPLETE 4+ VIEW;  LEFT KNEE - COMPLETE 4+ VIEW COMPARISON:  None. FINDINGS: The joint spaces are maintained. No acute fracture. No joint effusion. IMPRESSION: No acute bony findings or joint effusion. Electronically Signed   By: Rudie Meyer M.D.   On: 06/15/2021 13:26   DG Knee Complete 4 Views Right  Result Date: 06/15/2021 CLINICAL DATA:  Larey Seat.  Bilateral knee pain. EXAM: RIGHT KNEE - COMPLETE 4+ VIEW; LEFT KNEE - COMPLETE 4+ VIEW COMPARISON:  None. FINDINGS: The joint spaces are maintained. No acute fracture. No joint effusion. IMPRESSION: No acute bony findings or joint effusion. Electronically Signed   By: Rudie Meyer M.D.   On: 06/15/2021 13:26   DG Hip Unilat With Pelvis 2-3 Views Right  Result Date: 06/15/2021 CLINICAL DATA:  L.  Right hip pain. EXAM: DG HIP (WITH OR WITHOUT PELVIS) 2-3V RIGHT COMPARISON:  None. FINDINGS: Both hips are normally located. Mild degenerative changes. No acute fracture. The pubic symphysis and SI joints are intact. No pelvic fractures or bone lesions. IMPRESSION: Mild degenerative changes but no acute bony findings. Electronically Signed   By: Rudie Meyer M.D.   On: 06/15/2021 13:24     Assessment/Plan:  60yo M with etoh dependence admitted for AMS found to have rhabdomyolysis with AKI in the setting of MSSA bacteremia with no overt source  - recommend to continue on cefazolin for the time being - please get TTE - repeat blood cx  Rhabdomyolysis = continue with IVF  to minimize AKI  ETOH dependence = watch for withdrawal.

## 2021-06-16 NOTE — Evaluation (Signed)
Physical Therapy Evaluation Patient Details Name: Joseph Bell MRN: [id number] DOB: 1961-01-02 Today's Date: 06/16/2021   History of Present Illness  60 yo male admitted with hyponatremia, AMS, weakness, falls, sepsis, rhabdomyolysis. RR called on 7/12. Hx of chronic L rib fxs, ETOH abuse  Clinical Impression  On eval, pt required Min A for mobility. He was able to stand and take a few steps along side of bed with a RW. Pt presents with general weakness, decreased activity tolerance, and impaired gait and balance. He is at risk for falls when mobilizing. Pt reports moderate pain with activity. Will plan to follow and progress activity as tolerated.     Follow Up Recommendations SNF    Equipment Recommendations  Rolling walker with 5" wheels    Recommendations for Other Services       Precautions / Restrictions Precautions Precautions: Fall Restrictions Weight Bearing Restrictions: No      Mobility  Bed Mobility Overal bed mobility: Needs Assistance Bed Mobility: Supine to Sit;Sit to Supine     Supine to sit: Min guard;HOB elevated Sit to supine: Min guard;HOB elevated   General bed mobility comments: Increased time. Pt used bedrail.    Transfers Overall transfer level: Needs assistance Equipment used: Rolling walker (2 wheeled) Transfers: Sit to/from Stand Sit to Stand: Min assist;From elevated surface         General transfer comment: x 2. Assist to power up, stabilize, control descent. Unsteady. Cues for safety, hand placement. Dyspena 2/4 with minimal activity.  Ambulation/Gait Ambulation/Gait assistance: Min assist   Assistive device: Rolling walker (2 wheeled)       General Gait Details: side steps along the side of bed with RW. Cues for safety, technique. Assist to steady pt and manage RW. Fatigues easily. Unsteady.  Stairs            Wheelchair Mobility    Modified Rankin (Stroke Patients Only)       Balance Overall balance  assessment: Needs assistance;History of Falls         Standing balance support: Bilateral upper extremity supported Standing balance-Leahy Scale: Poor                               Pertinent Vitals/Pain Pain Assessment: Faces Faces Pain Scale: Hurts even more Pain Location: ribs, R hand (blister) Pain Descriptors / Indicators: Discomfort;Sore;Grimacing Pain Intervention(s): Limited activity within patient's tolerance;Monitored during session    Home Living Family/patient expects to be discharged to:: Shelter/Homeless                      Prior Function Level of Independence: Independent               Hand Dominance        Extremity/Trunk Assessment   Upper Extremity Assessment Upper Extremity Assessment: Defer to OT evaluation (blister R hand)    Lower Extremity Assessment Lower Extremity Assessment: Generalized weakness    Cervical / Trunk Assessment Cervical / Trunk Assessment: Normal  Communication   Communication: HOH (L ear better)  Cognition Arousal/Alertness: Awake/alert Behavior During Therapy: WFL for tasks assessed/performed Overall Cognitive Status: No family/caregiver present to determine baseline cognitive functioning Area of Impairment: Safety/judgement;Problem solving                         Safety/Judgement: Decreased awareness of safety   Problem Solving: Requires verbal cues  General Comments      Exercises     Assessment/Plan    PT Assessment Patient needs continued PT services  PT Problem List Decreased strength;Decreased mobility;Decreased activity tolerance;Decreased balance;Decreased knowledge of use of DME;Pain       PT Treatment Interventions DME instruction;Gait training;Therapeutic exercise;Balance training;Functional mobility training;Therapeutic activities;Patient/family education    PT Goals (Current goals can be found in the Care Plan section)  Acute Rehab PT Goals Patient  Stated Goal: less pain. to find somewhere to go PT Goal Formulation: With patient Time For Goal Achievement: 06/30/21 Potential to Achieve Goals: Good    Frequency Min 3X/week   Barriers to discharge        Co-evaluation               AM-PAC PT "6 Clicks" Mobility  Outcome Measure Help needed turning from your back to your side while in a flat bed without using bedrails?: A Little Help needed moving from lying on your back to sitting on the side of a flat bed without using bedrails?: A Little Help needed moving to and from a bed to a chair (including a wheelchair)?: A Little Help needed standing up from a chair using your arms (e.g., wheelchair or bedside chair)?: A Little Help needed to walk in hospital room?: A Lot Help needed climbing 3-5 steps with a railing? : A Lot 6 Click Score: 16    End of Session   Activity Tolerance: Patient limited by fatigue;Patient limited by pain Patient left: in bed;with call bell/phone within reach;with bed alarm set   PT Visit Diagnosis: Muscle weakness (generalized) (M62.81);Pain;Other abnormalities of gait and mobility (R26.89);History of falling (Z91.81)    Time: 7262-0355 PT Time Calculation (min) (ACUTE ONLY): 13 min   Charges:   PT Evaluation $PT Eval Moderate Complexity: 1 Mod            Faye Ramsay, PT Acute Rehabilitation  Office: [phone] Pager: [phone]

## 2021-06-17 ENCOUNTER — Inpatient Hospital Stay (HOSPITAL_COMMUNITY): Payer: Self-pay

## 2021-06-17 DIAGNOSIS — Z7289 Other problems related to lifestyle: Secondary | ICD-10-CM

## 2021-06-17 DIAGNOSIS — Z8781 Personal history of (healed) traumatic fracture: Secondary | ICD-10-CM

## 2021-06-17 DIAGNOSIS — R7881 Bacteremia: Principal | ICD-10-CM

## 2021-06-17 DIAGNOSIS — E871 Hypo-osmolality and hyponatremia: Secondary | ICD-10-CM

## 2021-06-17 DIAGNOSIS — T796XXS Traumatic ischemia of muscle, sequela: Secondary | ICD-10-CM

## 2021-06-17 DIAGNOSIS — B9561 Methicillin susceptible Staphylococcus aureus infection as the cause of diseases classified elsewhere: Secondary | ICD-10-CM

## 2021-06-17 LAB — COMPREHENSIVE METABOLIC PANEL
ALT: 55 U/L — ABNORMAL HIGH (ref 0–44)
AST: 103 U/L — ABNORMAL HIGH (ref 15–41)
Albumin: 2.2 g/dL — ABNORMAL LOW (ref 3.5–5.0)
Alkaline Phosphatase: 43 U/L (ref 38–126)
Anion gap: 7 (ref 5–15)
BUN: 6 mg/dL (ref 6–20)
CO2: 26 mmol/L (ref 22–32)
Calcium: 7.9 mg/dL — ABNORMAL LOW (ref 8.9–10.3)
Chloride: 96 mmol/L — ABNORMAL LOW (ref 98–111)
Creatinine, Ser: 0.43 mg/dL — ABNORMAL LOW (ref 0.61–1.24)
GFR, Estimated: >60 mL/min (ref >60)
Glucose, Bld: 103 mg/dL — ABNORMAL HIGH (ref 70–99)
Potassium: 2.6 mmol/L — CL (ref 3.5–5.1)
Sodium: 129 mmol/L — ABNORMAL LOW (ref 135–145)
Total Bilirubin: 0.8 mg/dL (ref 0.3–1.2)
Total Protein: 5.1 g/dL — ABNORMAL LOW (ref 6.5–8.1)

## 2021-06-17 LAB — MAGNESIUM: Magnesium: 1.7 mg/dL (ref 1.7–2.4)

## 2021-06-17 LAB — CBC
HCT: 36.2 % — ABNORMAL LOW (ref 39.0–52.0)
Hemoglobin: 12.8 g/dL — ABNORMAL LOW (ref 13.0–17.0)
MCH: 36.7 pg — ABNORMAL HIGH (ref 26.0–34.0)
MCHC: 35.4 g/dL (ref 30.0–36.0)
MCV: 103.7 fL — ABNORMAL HIGH (ref 80.0–100.0)
Platelets: 96 10*3/uL — ABNORMAL LOW (ref 150–400)
RBC: 3.49 MIL/uL — ABNORMAL LOW (ref 4.22–5.81)
RDW: 11.9 % (ref 11.5–15.5)
WBC: 12.3 10*3/uL — ABNORMAL HIGH (ref 4.0–10.5)
nRBC: 0 % (ref 0.0–0.2)

## 2021-06-17 LAB — BASIC METABOLIC PANEL
Anion gap: 8 (ref 5–15)
BUN: 6 mg/dL (ref 6–20)
CO2: 29 mmol/L (ref 22–32)
Calcium: 8.4 mg/dL — ABNORMAL LOW (ref 8.9–10.3)
Chloride: 93 mmol/L — ABNORMAL LOW (ref 98–111)
Creatinine, Ser: 0.57 mg/dL — ABNORMAL LOW (ref 0.61–1.24)
GFR, Estimated: >60 mL/min (ref >60)
Glucose, Bld: 134 mg/dL — ABNORMAL HIGH (ref 70–99)
Potassium: 3 mmol/L — ABNORMAL LOW (ref 3.5–5.1)
Sodium: 130 mmol/L — ABNORMAL LOW (ref 135–145)

## 2021-06-17 LAB — PROCALCITONIN: Procalcitonin: 0.84 ng/mL

## 2021-06-17 MED ORDER — POTASSIUM CHLORIDE CRYS ER 10 MEQ PO TBCR
40.0000 meq | EXTENDED_RELEASE_TABLET | ORAL | Status: AC
  Administered 2021-06-17 (×2): 40 meq via ORAL
  Filled 2021-06-17 (×2): qty 4

## 2021-06-17 MED ORDER — IPRATROPIUM-ALBUTEROL 0.5-2.5 (3) MG/3ML IN SOLN
3.0000 mL | Freq: Four times a day (QID) | RESPIRATORY_TRACT | Status: DC
  Filled 2021-06-17: qty 3

## 2021-06-17 MED ORDER — VITAMIN B-12 1000 MCG PO TABS
1000.0000 ug | ORAL_TABLET | Freq: Every day | ORAL | Status: DC
  Administered 2021-06-18 – 2021-06-26 (×8): 1000 ug via ORAL
  Filled 2021-06-17 (×8): qty 1

## 2021-06-17 MED ORDER — POTASSIUM CHLORIDE CRYS ER 10 MEQ PO TBCR
40.0000 meq | EXTENDED_RELEASE_TABLET | ORAL | Status: AC
  Administered 2021-06-17 – 2021-06-18 (×2): 40 meq via ORAL
  Filled 2021-06-17 (×2): qty 4

## 2021-06-17 MED ORDER — ENSURE ENLIVE PO LIQD
237.0000 mL | Freq: Two times a day (BID) | ORAL | Status: DC
  Administered 2021-06-17 – 2021-06-26 (×17): 237 mL via ORAL

## 2021-06-17 MED ORDER — SIMETHICONE 80 MG PO CHEW
80.0000 mg | CHEWABLE_TABLET | Freq: Four times a day (QID) | ORAL | Status: DC
  Administered 2021-06-17 – 2021-06-26 (×28): 80 mg via ORAL
  Filled 2021-06-17 (×28): qty 1

## 2021-06-17 MED ORDER — ZOLPIDEM TARTRATE 5 MG PO TABS
5.0000 mg | ORAL_TABLET | Freq: Every evening | ORAL | Status: DC | PRN
  Administered 2021-06-18 – 2021-06-23 (×4): 5 mg via ORAL
  Filled 2021-06-17 (×5): qty 1

## 2021-06-17 NOTE — Progress Notes (Signed)
Critical potassium 2.6. Paged MD

## 2021-06-17 NOTE — Progress Notes (Signed)
Initial Nutrition Assessment  INTERVENTION:   -Ensure Enlive po BID, each supplement provides 350 kcal and 20 grams of protein  -Needs updated weight as pt's recorded weight on 7/12 was the exact same as previous encounters in 2018 and 2015.  NUTRITION DIAGNOSIS:   Increased nutrient needs related to acute illness as evidenced by estimated needs.  GOAL:   Patient will meet greater than or equal to 90% of their needs  MONITOR:   PO intake, Supplement acceptance, Weight trends, I & O's, Labs  REASON FOR ASSESSMENT:   Malnutrition Screening Tool    ASSESSMENT:   60 y.o. male with medical history significant of EtOH abuse. Presenting with altered mental status.  Patient in room receiving patient care.  Per chart review, pt is currently homeless. Relies on a food truck to provide some free meals. Pt with history of alcohol abuse as well. Will order Ensure supplements for additional kcals and protein.  Per weight records, recorded weight from 7/12 is the exact same weight that was recorded in 2018 and 2015. Suspect this was reported not measured.  Medications: Folic acid, Multivitamin with minerals daily, KLOR-CON, Thiamine, Vitamin B-12  Labs reviewed: Low Na, K, Vitamin B-12 UDS+ opiates   NUTRITION - FOCUSED PHYSICAL EXAM:  Unable to complete  Diet Order:   Diet Order             Diet regular Room service appropriate? Yes; Fluid consistency: Thin  Diet effective now                   EDUCATION NEEDS:   No education needs have been identified at this time  Skin:  Skin Assessment: Reviewed RN Assessment  Last BM:  PTA  Height:   Ht Readings from Last 1 Encounters:  06/15/21 5\' 5"  (1.651 m)    Weight:   Wt Readings from Last 1 Encounters:  06/15/21 81.6 kg    BMI:  Body mass index is 29.94 kg/m.  Estimated Nutritional Needs:   Kcal:  1900-2100  Protein:  85-95g  Fluid:  2L/day  08/16/21, MS, RD, LDN Inpatient Clinical  Dietitian Contact information available via Amion

## 2021-06-17 NOTE — Progress Notes (Signed)
[facility] Progress Note  Patient: Joseph Bell    FBX:[id number]  DOA: 06/15/2021     Date of Service: the patient was seen and examined on 06/17/2021  Brief hospital course: Past medical history of alcohol abuse.  Does not see any physician on a regular basis.  Presents with complaints of generalized weakness. Review of systems still limited as the patient is confused. Found to have staph aureus bacteremia and hyponatremia with rhabdomyolysis. Currently plan is continue IV antibiotics and IV fluids.  Subjective: Patient with complaint of left wrist pain.  No nausea no vomiting.  Also has shortness of breath although denies any complaint.  No cough.  Assessment and Plan: 1.  Staph aureus bacteremia Suspect MSSA. Etiology of the bacteremia still not clear. Patient presented with generalized weakness and confusion without any evidence of sepsis. Continue with IV antibiotics switched to cefazolin. Will require follow-up culture as well as further work-up including echocardiogram. Monitor for now.  2.  Acute metabolic encephalopathy Generalized weakness Likely multifactorial  Infection alcohol nutritional deficiency  Monitor for now.  Bilateral expiratory wheezing. Suspecting secondary to undiagnosed COPD exacerbation. Will initiate with duo nebs. Currently holding off on steroids. Already on antibiotics. I do not think the patient has volume overload with IV fluids for now. Monitor.  BLE weakness no weakness on exam, but he says he is unable to really walk PT/OT consult  Left wrist pain. Suspect secondary to fall. Will get x-ray. Monitor.  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 monitor   Hypokalemia Replaced   Scheduled Meds:  ceFAZolin  (ANCEF) IVPB 2 gram/100 mL NS (Mini-Bag Plus)  2 g Intravenous Q8H   feeding supplement  237 mL Oral BID BM   folic acid  1 mg Oral Daily   ipratropium-albuterol  3 mL Nebulization QID   multivitamin with minerals  1 tablet Oral Daily   pneumococcal 23 valent vaccine  0.5 mL Intramuscular Tomorrow-1000   potassium chloride  40 mEq Oral Q2H   simethicone  80 mg Oral QID   thiamine  100 mg Oral Daily   Or   thiamine  100 mg Intravenous Daily   [START ON 06/18/2021] vitamin B-12  1,000 mcg Oral Daily   Continuous Infusions:   PRN Meds: acetaminophen **OR** acetaminophen, HYDROcodone-acetaminophen, LORazepam **OR** LORazepam, ondansetron **OR** ondansetron (ZOFRAN) IV  Body mass index is 29.94 kg/m.  Nutrition Problem: Increased nutrient needs Etiology: acute illness     DVT Prophylaxis:   SCDs Start: 06/15/21 1735    Advance goals of care discussion: Pt is Full code.  Family Communication: no family was present at bedside, at the time of interview.   Data Reviewed: I have personally reviewed and interpreted daily labs, tele strips, imaging. Sodium 130.  Potassium improving after aggressive replacement.  LFTs improving as well.  Physical Exam:  General: Appear in mild distress, no Rash; Oral Mucosa Clear, moist. no Abnormal Neck Mass Or lumps, Conjunctiva normal  Cardiovascular: S1 and S2 Present, no Murmur, Respiratory: good respiratory effort, Bilateral Air entry present and CTA, no Crackles, no wheezes Abdomen: Bowel Sound present, Soft and no tenderness Extremities: no Pedal edema Neurology: alert and oriented to time, place, and person affect appropriate. no new focal deficit Gait not checked due to patient safety concerns    Vitals:  06/16/21 1342 06/16/21 2013 06/17/21 0516 06/17/21 1249  BP: 109/80 121/74 115/71 136/76  Pulse: 80 99 74 (!) 102  Resp: 20 20 18 19   Temp: (!) 97.3 F (36.3 C) 98 F (36.7 C) 98 F (36.7 C) 97.8 F (36.6 C)  TempSrc: Oral Oral   Oral  SpO2: 95% 95% 96% 95%  Weight:      Height:        Disposition:  Status is: Inpatient  Remains inpatient appropriate because:Altered mental status and Inpatient level of care appropriate due to severity of illness  Dispo: The patient is from: Home              Anticipated d/c is to: SNF              Patient currently is not medically stable to d/c.   Difficult to place patient No        Time spent: 35 minutes. I reviewed all nursing notes, pharmacy notes, vitals, pertinent old records. I have discussed plan of care as described above with RN.  Author: , MD [facility] 06/17/2021 6:20 PM  To reach On-call, see care teams to locate the attending and reach out via [email or web address]/[email or web address]/2021. Between 7PM-7AM, please contact night-coverage If you still have difficulty reaching the attending provider, please page the [facility] (Director on Call) for [facility] on amion for assistance.

## 2021-06-17 NOTE — Progress Notes (Signed)
[facility]    Date of Admission:  06/15/2021   Total days of antibiotics 3/cefazolin          ID: Joseph Bell is a 60 y.o. male with  rhabdo with MSSA bacteremia Active Problems:   Hyponatremia    Subjective: Afebrile. But has left rib cage and left forearm pain from recent fall  Medications:   ceFAZolin (ANCEF) IVPB 2 gram/100 mL NS (Mini-Bag Plus)  2 g Intravenous Q8H   feeding supplement  237 mL Oral BID BM   folic acid  1 mg Oral Daily   ipratropium-albuterol  3 mL Nebulization QID   multivitamin with minerals  1 tablet Oral Daily   pneumococcal 23 valent vaccine  0.5 mL Intramuscular Tomorrow-1000   simethicone  80 mg Oral QID   thiamine  100 mg Oral Daily   Or   thiamine  100 mg Intravenous Daily   [START ON 06/18/2021] vitamin B-12  1,000 mcg Oral Daily    Objective: Vital signs in last 24 hours: Temp:  [97.8 F (36.6 C)-98 F (36.7 C)] 97.8 F (36.6 C) (07/14 1249) Pulse Rate:  [74-102] 102 (07/14 1249) Resp:  [18-20] 19 (07/14 1249) BP: (115-136)/(71-76) 136/76 (07/14 1249) SpO2:  [95 %-96 %] 95 % (07/14 1249)  Physical Exam  Constitutional: He is oriented to person, place, and time. He appears well-developed and well-nourished. No distress.  HENT:  Mouth/Throat: Oropharynx is clear and moist. No oropharyngeal exudate.  Cardiovascular: Normal rate, regular rhythm and normal heart sounds. Exam reveals no gallop and no friction rub.  No murmur heard.  Pulmonary/Chest: Effort normal and breath sounds normal. No respiratory distress. He has no wheezes.  Abdominal: Soft. Bowel sounds are normal. He exhibits no distension. There is no tenderness.  Lymphadenopathy:  He has no cervical adenopathy.  Ext: left arm pain with touch Neurological: He is alert and oriented to person, place, and time.  Skin: Skin is warm and dry. No rash noted. No erythema.  Psychiatric: He has a normal mood and affect. His behavior is normal.     Lab Results Recent Labs    06/16/21 0534 06/16/21 0811 06/17/21 0454 06/17/21 0612 06/17/21 1433  WBC 11.5*  --  12.3*  --   --   HGB 13.5  --  12.8*  --   --   HCT 39.0  --  36.2*  --   --   NA 128*   < >  --  129* 130*  K 3.0*   < >  --  2.6* 3.0*  CL 97*   < >  --  96* 93*  CO2 22   < >  --  26 29  BUN 11   < >  --  6 6  CREATININE 0.56*   < >  --  0.43* 0.57*   < > = values in this interval not displayed.   Liver Panel Recent Labs    06/16/21 0811 06/17/21 0612  PROT 5.8* 5.1*  ALBUMIN 2.6*  2.6* 2.2*  AST 168* 103*  ALT 68* 55*  ALKPHOS 40 43  BILITOT 0.8 0.8  BILIDIR 0.3*  --   IBILI 0.5  --    Sedimentation Rate No results for input(s): ESRSEDRATE in the last 72 hours. C-Reactive Protein No results for input(s): CRP in the last 72 hours.  Microbiology: 7/14 blood cx ngtd 7/12 blood cx mssa Studies/Results: DG Chest Port 1 View  Result Date: 06/15/2021  CLINICAL DATA:  60 year old male male with medical history significant for alcohol abuse with altered mental status. EXAM: PORTABLE CHEST 1 VIEW COMPARISON:  June 15, 2021, earlier on the same date. FINDINGS: EKG leads project over the chest. Cardiomediastinal contours and hilar structures are normal. No sign of lobar consolidation or evidence of pleural effusion. Mild subtle airspace disease in the retrocardiac region. No pneumothorax. On limited assessment no acute skeletal process. Signs of prior trauma to the LEFT clavicle similar to previous imaging. IMPRESSION: Mild subtle airspace disease in the retrocardiac region, potentially related to atelectasis, difficult to exclude developing infection. No lobar consolidation or sign of effusion. Signs of chronic fracture of LEFT posterolateral ribs as outlined on previous report. Electronically Signed   By: Donzetta Kohut M.D.   On: 06/15/2021 19:28   DG Abd 2 Views  Result Date: 06/16/2021 CLINICAL DATA:  Abdominal pain and distension EXAM: ABDOMEN - 2 VIEW  COMPARISON:  None. FINDINGS: Scattered large and small bowel gas is noted. No free air is seen. No abnormal mass or abnormal calcifications are noted. No bony abnormality is seen. IMPRESSION: Scattered large and small bowel gas which may represent a mild ileus. No definitive obstructive changes are seen. Electronically Signed   By: Alcide Clever M.D.   On: 06/16/2021 19:00     Assessment/Plan: MSSA bacteremia of unclear source = recommend to get TTE to start evaluating for endocarditis.   Left arm pain = thought to be due to trauma, recommend to start with plan films  Left chestwall pain = chronic fractures noted on his cxr  Etoh use = continue to watch for withdrawal. Ciwa protocol  [facility] Cell: [phone] Pager: [phone]  06/17/2021, 6:09 PM

## 2021-06-17 NOTE — Evaluation (Signed)
Occupational Therapy Evaluation Patient Details Name: Joseph Bell MRN: [id number] DOB: Jul 02, 1961 Today's Date: 06/17/2021    History of Present Illness patient is a 60 yo male admitted with hyponatremia, AMS, weakness, falls, sepsis, rhabdomyolysis. RR called on 7/12. Hx of chronic L rib fxs, ETOH abuse   Clinical Impression   Patient was in bed at start of session. Patient required increased encouragement to participate in supine to sit on edge of bed. Patient was noted to be irritable with moving reporting he was up all night with people checking on him. Patient was min guard for supine to sit on edge of bed. Patient required increased time with patient hearing cues but not following them at times. Patient was able to sit on edge of bed with min guard with heart rate increasing to 118 bpm sitting on edge of bed. Patient declined to stand or use bathroom at this time. Patient required min guard for sit to supine and scooting to head of bed for improved positioning in bed. Patient could benefit from continuing to participate in skilled OT services to better establish current levels and ensure patient is at baseline. OT to continue to follow in acute care level.     Follow Up Recommendations  No OT follow up    Equipment Recommendations       Recommendations for Other Services       Precautions / Restrictions Precautions Precautions: Fall Restrictions Weight Bearing Restrictions: No      Mobility Bed Mobility Overal bed mobility: Needs Assistance Bed Mobility: Supine to Sit;Sit to Supine     Supine to sit: Min guard;HOB elevated Sit to supine: Min guard;HOB elevated   General bed mobility comments: Increased time. Pt used bedrail. patient decilned to stand on this date.    Transfers                      Balance                                           ADL either performed or assessed with clinical judgement   ADL Overall ADL's : Needs  assistance/impaired Eating/Feeding: Set up;Sitting Eating/Feeding Details (indicate cue type and reason): patient was educated on importance of sitting upright to take drinks of soda.                                   General ADL Comments: patient declined to participate in LB dressing and mobility on this date reporting that he was tired.     Vision         Perception     Praxis      Pertinent Vitals/Pain Pain Assessment: Faces Faces Pain Scale: Hurts even more Pain Location: ribs, R hand (blister) patient was also noted to have pain in L wrist but grabbed R shoulder when reporting pain. Pain Descriptors / Indicators: Discomfort;Sore;Grimacing Pain Intervention(s): Monitored during session     Hand Dominance     Extremity/Trunk Assessment Upper Extremity Assessment Upper Extremity Assessment: RUE deficits/detail RUE Deficits / Details: patient was able to felx bilateral shoulders to about 80 degrees on this date with indications of pain in R shoulder but verbal report   Lower Extremity Assessment Lower Extremity Assessment: Defer to PT evaluation   Cervical / Trunk Assessment  Cervical / Trunk Assessment: Normal   Communication Communication Communication: HOH   Cognition Arousal/Alertness: Awake/alert Behavior During Therapy: WFL for tasks assessed/performed Overall Cognitive Status: No family/caregiver present to determine baseline cognitive functioning Area of Impairment: Safety/judgement;Problem solving                         Safety/Judgement: Decreased awareness of safety   Problem Solving: Requires verbal cues     General Comments       Exercises     Shoulder Instructions      Home Living Family/patient expects to be discharged to:: Shelter/Homeless                                        Prior Functioning/Environment Level of Independence: Independent                 OT Problem List: Decreased  strength;Impaired balance (sitting and/or standing);Decreased cognition;Decreased coordination;Decreased activity tolerance      OT Treatment/Interventions: Self-care/ADL training;DME and/or AE instruction;Therapeutic activities;Balance training;Therapeutic exercise;Patient/family education    OT Goals(Current goals can be found in the care plan section) Acute Rehab OT Goals Patient Stated Goal: less pain. to find somewhere to go OT Goal Formulation: With patient Time For Goal Achievement: 07/01/21 Potential to Achieve Goals: Good  OT Frequency: Min 2X/week   Barriers to D/C: Inaccessible home environment                        AM-PAC OT "6 Clicks" Daily Activity     Outcome Measure Help from another person eating meals?: None Help from another person taking care of personal grooming?: A Little Help from another person toileting, which includes using toliet, bedpan, or urinal?: A Lot Help from another person bathing (including washing, rinsing, drying)?: A Lot Help from another person to put on and taking off regular upper body clothing?: A Lot Help from another person to put on and taking off regular lower body clothing?: A Lot 6 Click Score: 15   End of Session Nurse Communication: Other (comment) (nurse cleared patient to participate)  Activity Tolerance: Patient limited by lethargy;Patient limited by fatigue Patient left: in bed;with call bell/phone within reach;with bed alarm set  OT Visit Diagnosis: Unsteadiness on feet (R26.81);Muscle weakness (generalized) (M62.81)                Time: 6568-1275 OT Time Calculation (min): 22 min Charges:  OT General Charges $OT Visit: 1 Visit OT Evaluation $OT Eval Low Complexity: 1 Low  Sharyn Blitz OTR/L, MS Acute Rehabilitation Department Office# [phone] Pager# [phone]   Chalmers Guest Amiree No 06/17/2021, 4:29 PM

## 2021-06-18 ENCOUNTER — Inpatient Hospital Stay (HOSPITAL_COMMUNITY): Payer: Self-pay

## 2021-06-18 DIAGNOSIS — R7881 Bacteremia: Secondary | ICD-10-CM

## 2021-06-18 LAB — ECHOCARDIOGRAM COMPLETE
AR max vel: 1.97 cm2
AV Area VTI: 1.87 cm2
AV Area mean vel: 1.89 cm2
AV Mean grad: 10.8 mmHg
AV Peak grad: 18.3 mmHg
Ao pk vel: 2.14 m/s
Area-P 1/2: 4.15 cm2
Calc EF: 60.7 %
Height: 65 in
P 1/2 time: 379 msec
S' Lateral: 2.4 cm
Single Plane A2C EF: 60.1 %
Single Plane A4C EF: 56.9 %
Weight: 2878.33 oz

## 2021-06-18 LAB — CBC
HCT: 35.9 % — ABNORMAL LOW (ref 39.0–52.0)
Hemoglobin: 12.8 g/dL — ABNORMAL LOW (ref 13.0–17.0)
MCH: 36.3 pg — ABNORMAL HIGH (ref 26.0–34.0)
MCHC: 35.7 g/dL (ref 30.0–36.0)
MCV: 101.7 fL — ABNORMAL HIGH (ref 80.0–100.0)
Platelets: 136 10*3/uL — ABNORMAL LOW (ref 150–400)
RBC: 3.53 MIL/uL — ABNORMAL LOW (ref 4.22–5.81)
RDW: 11.7 % (ref 11.5–15.5)
WBC: 14.1 10*3/uL — ABNORMAL HIGH (ref 4.0–10.5)
nRBC: 0 % (ref 0.0–0.2)

## 2021-06-18 LAB — COMPREHENSIVE METABOLIC PANEL
ALT: 42 U/L (ref 0–44)
AST: 67 U/L — ABNORMAL HIGH (ref 15–41)
Albumin: 2.2 g/dL — ABNORMAL LOW (ref 3.5–5.0)
Alkaline Phosphatase: 44 U/L (ref 38–126)
Anion gap: 7 (ref 5–15)
BUN: <5 mg/dL — ABNORMAL LOW (ref 6–20)
CO2: 32 mmol/L (ref 22–32)
Calcium: 8.4 mg/dL — ABNORMAL LOW (ref 8.9–10.3)
Chloride: 92 mmol/L — ABNORMAL LOW (ref 98–111)
Creatinine, Ser: 0.33 mg/dL — ABNORMAL LOW (ref 0.61–1.24)
GFR, Estimated: >60 mL/min (ref >60)
Glucose, Bld: 110 mg/dL — ABNORMAL HIGH (ref 70–99)
Potassium: 2.9 mmol/L — ABNORMAL LOW (ref 3.5–5.1)
Sodium: 131 mmol/L — ABNORMAL LOW (ref 135–145)
Total Bilirubin: 0.9 mg/dL (ref 0.3–1.2)
Total Protein: 5.1 g/dL — ABNORMAL LOW (ref 6.5–8.1)

## 2021-06-18 LAB — CULTURE, BLOOD (ROUTINE X 2)
Special Requests: ADEQUATE
Special Requests: ADEQUATE

## 2021-06-18 LAB — CK: Total CK: 179 U/L (ref 49–397)

## 2021-06-18 LAB — HCV RNA QUANT RFLX ULTRA OR GENOTYP
HCV RNA Qnt(log copy/mL): UNDETERMINED log10 IU/mL
HepC Qn: NOT DETECTED IU/mL

## 2021-06-18 LAB — MAGNESIUM: Magnesium: 1.5 mg/dL — ABNORMAL LOW (ref 1.7–2.4)

## 2021-06-18 MED ORDER — POTASSIUM CHLORIDE CRYS ER 10 MEQ PO TBCR
40.0000 meq | EXTENDED_RELEASE_TABLET | Freq: Two times a day (BID) | ORAL | Status: AC
  Administered 2021-06-18 – 2021-06-19 (×4): 40 meq via ORAL
  Filled 2021-06-18 (×4): qty 4

## 2021-06-18 MED ORDER — IPRATROPIUM-ALBUTEROL 0.5-2.5 (3) MG/3ML IN SOLN: 3.0000 mL | Freq: Two times a day (BID) | RESPIRATORY_TRACT | Status: DC

## 2021-06-18 MED ORDER — IPRATROPIUM-ALBUTEROL 0.5-2.5 (3) MG/3ML IN SOLN: 3.0000 mL | RESPIRATORY_TRACT | Status: DC | PRN

## 2021-06-18 NOTE — Progress Notes (Signed)
Physical Therapy Treatment Patient Details Name: Joseph Bell MRN: [id number] DOB: 1961/04/07 Today's Date: 06/18/2021    History of Present Illness patient is a 60 yo male admitted with hyponatremia, AMS, weakness, falls, sepsis, rhabdomyolysis. RR called on 7/12. Hx of chronic L rib fxs, ETOH abuse    PT Comments    Pt is progressing toward acute PT goals with improved activity tolerance this session. Pt still displays balance and ambulation deficits and decreased safety awareness requiring multiple verbal and tactile cues to maintain safety putting him at increased risk for falls. Pt will benefit from skilled PT to increase their independence and safety with mobility to allow discharge to the venue listed below.      Follow Up Recommendations  SNF     Equipment Recommendations  Rolling walker with 5" wheels    Recommendations for Other Services       Precautions / Restrictions Precautions Precautions: Fall Restrictions Weight Bearing Restrictions: No    Mobility  Bed Mobility Overal bed mobility: Needs Assistance Bed Mobility: Supine to Sit;Sit to Supine     Supine to sit: Supervision;HOB elevated Sit to supine: Supervision;HOB elevated   General bed mobility comments: use of bed rails an dincreased time for supine to sit and sit to supine transfers. Superision provided for safety    Transfers Overall transfer level: Needs assistance Equipment used: Rolling walker (2 wheeled) Transfers: Sit to/from Stand Sit to Stand: Min assist         General transfer comment: Min A to power up and for stability and intermittently for controlled. Cues for safe, hand placement.  Ambulation/Gait Ambulation/Gait assistance: Min assist Gait Distance (Feet): 15 Feet Assistive device: Rolling walker (2 wheeled) Gait Pattern/deviations: Step-through pattern;Decreased stride length Gait velocity: decr   General Gait Details: Pt performed side steps along bed with MIN  physical assist A for stabiltiy and verbal/tactile cuing for RW management and sequencing. Cues for safety, technique. Progressed to taking steps from EOB to sink and back, constant verbal/tactile cues to maintain safe proximity to RW, pt with tendency to have RW too far in front and to keep RW in contact with floor.   Stairs             Wheelchair Mobility    Modified Rankin (Stroke Patients Only)       Balance Overall balance assessment: Needs assistance;History of Falls Sitting-balance support: Feet supported Sitting balance-Leahy Scale: Fair     Standing balance support: Bilateral upper extremity supported Standing balance-Leahy Scale: Poor Standing balance comment: use of external support                            Cognition Arousal/Alertness: Awake/alert Behavior During Therapy: WFL for tasks assessed/performed Overall Cognitive Status: No family/caregiver present to determine baseline cognitive functioning Area of Impairment: Safety/judgement;Problem solving                         Safety/Judgement: Decreased awareness of safety   Problem Solving: Requires verbal cues;Difficulty sequencing General Comments: Pt required multiple verbal.tactile cues for proper techinque with supine to sit and lateral side stepping with walker. Also multiple verbal cues given to initiate sit to supine at EOS. Pt able to verbalize correct location of "[facility]" and the correct month, unable to recall the year when asked multiple times during session.      Exercises Other Exercises Other Exercises: Sit to stand from  EOB x10 - MIN A for stability, B UEs used for power up to stand. Pt with posterior lean and LEs supported against bed without assist of RW in standing.    General Comments        Pertinent Vitals/Pain Pain Assessment: Faces Faces Pain Scale: Hurts little more Pain Location: L wrist Pain Descriptors / Indicators:  Discomfort;Sore;Grimacing Pain Intervention(s): Limited activity within patient's tolerance;Monitored during session;Repositioned    Home Living                      Prior Function            PT Goals (current goals can now be found in the care plan section) Acute Rehab PT Goals Patient Stated Goal: less pain. to find somewhere to go PT Goal Formulation: With patient Time For Goal Achievement: 06/30/21 Potential to Achieve Goals: Good Progress towards PT goals: Progressing toward goals    Frequency    Min 3X/week      PT Plan Current plan remains appropriate    Co-evaluation              AM-PAC PT "6 Clicks" Mobility   Outcome Measure  Help needed turning from your back to your side while in a flat bed without using bedrails?: A Little Help needed moving from lying on your back to sitting on the side of a flat bed without using bedrails?: A Little Help needed moving to and from a bed to a chair (including a wheelchair)?: A Little Help needed standing up from a chair using your arms (e.g., wheelchair or bedside chair)?: A Little Help needed to walk in hospital room?: A Little Help needed climbing 3-5 steps with a railing? : A Lot 6 Click Score: 17    End of Session Equipment Utilized During Treatment: Gait belt Activity Tolerance: Patient tolerated treatment well Patient left: in bed;with call bell/phone within reach;with bed alarm set Nurse Communication: Mobility status PT Visit Diagnosis: Muscle weakness (generalized) (M62.81);Pain;Other abnormalities of gait and mobility (R26.89);History of falling (Z91.81)     Time: 6063-0160 PT Time Calculation (min) (ACUTE ONLY): 17 min  Charges:  $Therapeutic Activity: 8-22 mins                     Lyman Speller PT, DPT  Acute Rehabilitation Services  Office [phone]   06/18/2021, 2:41 PM

## 2021-06-18 NOTE — Progress Notes (Addendum)
PROGRESS NOTE    Joseph Bell  WEX:[id number] DOB: 1961-11-11 DOA: 06/15/2021 PCP: Lavinia Sharps, NP   Brief Narrative:  Past medical history of alcohol abuse.  Does not see any physician on a regular basis.  Presents with complaints of generalized weakness. Review of systems still limited as the patient is confused. Found to have staph aureus bacteremia and hyponatremia with rhabdomyolysis. Currently plan is continue IV antibiotics and IV fluids.  Assessment & Plan:  MSSA bacteremia -unknown source -Patient presented with generalized weakness and confusion without any evidence of sepsis. -Continue with IV antibiotics- cefazolin. Repeat blood culture: NTD -Echo is pending to rule out endocarditis -Appreciate IDs recommendation   Acute metabolic encephalopathy Generalized weakness -Likely multifactorial in the setting of alcohol abuse -Consult PT/OT-recommend SNF-TOC consulted  Non-traumatic Rhabdomyolysis -CK improved from 598- to 179.  Left wrist pain: Left chest wall pain: -Suspect secondary to fall. -X-ray negative for acute findings. CXR: chronic Ribe Fracture -Continue as needed pain medications   Hyponatremia -Gradually improving -monitor   Hx of EtOH abuse Elevated LFTs --Continue CIWA, -Liver enzymes are improving -counseled against further use of EtOH -Continue multivitamins and folic acid supplements   Thrombocytopenia -secondary to EtOH abuse and liver dysfxn? -Platelet count improved from 96-136   Hypokalemia -Replenished.  Check magnesium level.  Repeat BMP tomorrow a.m.   DVT prophylaxis: SCD Code Status: Full code Family Communication:  None present at bedside.  Plan of care discussed with patient in length and he verbalized understanding and agreed with it. Disposition Plan: To be determined  Consultants:  ID  Procedures:  None  Antimicrobials:  Cefazolin  Status is: Inpatient  Remains inpatient appropriate because:Ongoing  diagnostic testing needed not appropriate for outpatient work up  Dispo: The patient is from: Home              Anticipated d/c is to: SNF              Patient currently is not medically stable to d/c.   Difficult to place patient No     Subjective: Patient seen and examined.  Sleepy but arousable.  No new complaints.  Remained afebrile.  No acute events overnight.  Objective: Vitals:   06/17/21 1249 06/17/21 2038 06/18/21 0621 06/18/21 1306  BP: 136/76 136/71 132/69 117/72  Pulse: (!) 102 (!) 104 83 88  Resp: 19 18 18 16   Temp: 97.8 F (36.6 C) 97.6 F (36.4 C) 98 F (36.7 C) 98.3 F (36.8 C)  TempSrc: Oral Oral  Oral  SpO2: 95% 92% 92% 94%  Weight:      Height:        Intake/Output Summary (Last 24 hours) at 06/18/2021 1346 Last data filed at 06/18/2021 0854 Gross per 24 hour  Intake 810.17 ml  Output 4875 ml  Net -4064.83 ml   Filed Weights   06/15/21 1131  Weight: 81.6 kg    Examination:  General exam: Appears calm and comfortable, sleepy but arousable Respiratory system: Clear to auscultation. Respiratory effort normal. Cardiovascular system: S1 & S2 heard, RRR. No JVD, murmurs, rubs, gallops or clicks. No pedal edema. Gastrointestinal system: Abdomen is nondistended, soft and nontender. No organomegaly or masses felt. Normal bowel sounds heard. Central nervous system: Alert and oriented. No focal neurological deficits. Extremities: Symmetric 5 x 5 power. Skin: No rashes, lesions or ulcers Psychiatry: Judgement and insight appear normal. Mood & affect appropriate.    Data Reviewed: I have personally reviewed following labs and imaging studies  CBC: Recent Labs  Lab 06/15/21 1143 06/15/21 1755 06/16/21 0534 06/16/21 0811 06/17/21 0454 06/18/21 0447  WBC 13.7* 11.9* 11.5*  --  12.3* 14.1*  NEUTROABS 11.7* 10.1*  --   --   --   --   HGB 14.0 14.7 13.5  --  12.8* 12.8*  HCT 38.2* 41.6 39.0  --  36.2* 35.9*  MCV 99.0 103.2* 105.7*  --  103.7* 101.7*   PLT 86* 40* 78* 80* 96* 136*   Basic Metabolic Panel: Recent Labs  Lab 06/15/21 1143 06/15/21 1755 06/15/21 1802 06/16/21 0534 06/16/21 0811 06/17/21 0612 06/17/21 1433 06/18/21 0447  NA 125*   < > 128* 128* 128* 129* 130* 131*  K 3.0*   < > 3.2* 3.0* 3.3* 2.6* 3.0* 2.9*  CL 89*   < > 93* 97* 97* 96* 93* 92*  CO2 24   < > 20* 22 24 26 29  32  GLUCOSE 110*   < > 91 101* 113* 103* 134* 110*  BUN 15   < > 10 11 10 6 6  <5*  CREATININE 0.70   < > 0.55* 0.56* 0.48* 0.43* 0.57* 0.33*  CALCIUM 8.5*   < > 8.1* 7.8* 7.8* 7.9* 8.4* 8.4*  MG 1.9  --   --   --   --  1.7  --   --   PHOS  --   --  2.6 2.5 2.6  --   --   --    < > = values in this interval not displayed.   GFR: Estimated Creatinine Clearance: 96.5 mL/min (A) (by C-G formula based on SCr of 0.33 mg/dL (L)). Liver Function Tests: Recent Labs  Lab 06/15/21 1143 06/15/21 1755 06/15/21 1802 06/16/21 0534 06/16/21 0811 06/17/21 0612 06/18/21 0447  AST 265* 255*  --   --  168* 103* 67*  ALT 86* 85*  --   --  68* 55* 42  ALKPHOS 44 59  --   --  40 43 44  BILITOT 1.4* 1.6*  --   --  0.8 0.8 0.9  PROT 6.8 6.7  --   --  5.8* 5.1* 5.1*  ALBUMIN 3.4* 3.1* 3.2* 2.8* 2.6*  2.6* 2.2* 2.2*   No results for input(s): LIPASE, AMYLASE in the last 168 hours. No results for input(s): AMMONIA in the last 168 hours. Coagulation Profile: Recent Labs  Lab 06/15/21 1755 06/16/21 0811  INR 1.0 1.1   Cardiac Enzymes: Recent Labs  Lab 06/15/21 1143 06/18/21 0447  CKTOTAL 5,981* 179   BNP (last 3 results) No results for input(s): PROBNP in the last 8760 hours. HbA1C: No results for input(s): HGBA1C in the last 72 hours. CBG: No results for input(s): GLUCAP in the last 168 hours. Lipid Profile: No results for input(s): CHOL, HDL, LDLCALC, TRIG, CHOLHDL, LDLDIRECT in the last 72 hours. Thyroid Function Tests: Recent Labs    06/16/21 0811  TSH 1.625  FREET4 0.79   Anemia Panel: Recent Labs    06/16/21 0811  VITAMINB12  132*   Sepsis Labs: Recent Labs  Lab 06/15/21 1755 06/15/21 1918 06/16/21 0811 06/17/21 0612  PROCALCITON  --   --  1.23 0.84  LATICACIDVEN 1.6 1.7  --   --     Recent Results (from the past 240 hour(s))  Resp Panel by RT-PCR (Flu A&B, Covid) Nasopharyngeal Swab     Status: None   Collection Time: 06/15/21  1:47 PM   Specimen: Nasopharyngeal Swab; Nasopharyngeal(NP) swabs in vial transport medium  Result Value Ref Range Status   SARS Coronavirus 2 by RT PCR NEGATIVE NEGATIVE Final    Comment: (NOTE) SARS-CoV-2 target nucleic acids are NOT DETECTED.  The SARS-CoV-2 RNA is generally detectable in upper respiratory specimens during the acute phase of infection. The lowest concentration of SARS-CoV-2 viral copies this assay can detect is 138 copies/mL. A negative result does not preclude SARS-Cov-2 infection and should not be used as the sole basis for treatment or other patient management decisions. A negative result may occur with  improper specimen collection/handling, submission of specimen other than nasopharyngeal swab, presence of viral mutation(s) within the areas targeted by this assay, and inadequate number of viral copies(<138 copies/mL). A negative result must be combined with clinical observations, patient history, and epidemiological information. The expected result is Negative.  Fact Sheet for Patients:  BloggerCourse.com  Fact Sheet for Healthcare Providers:  SeriousBroker.it  This test is no t yet approved or cleared by the [place] FDA and  has been authorized for detection and/or diagnosis of SARS-CoV-2 by FDA under an Emergency Use Authorization (EUA). This EUA will remain  in effect (meaning this test can be used) for the duration of the COVID-19 declaration under Section 564(b)(1) of the Act, 21 U.S.C.section 360bbb-3(b)(1), unless the authorization is terminated  or revoked sooner.        Influenza A by PCR NEGATIVE NEGATIVE Final   Influenza B by PCR NEGATIVE NEGATIVE Final    Comment: (NOTE) The Xpert Xpress SARS-CoV-2/FLU/RSV plus assay is intended as an aid in the diagnosis of influenza from Nasopharyngeal swab specimens and should not be used as a sole basis for treatment. Nasal washings and aspirates are unacceptable for Xpert Xpress SARS-CoV-2/FLU/RSV testing.  Fact Sheet for Patients: BloggerCourse.com  Fact Sheet for Healthcare Providers: SeriousBroker.it  This test is not yet approved or cleared by the [place] FDA and has been authorized for detection and/or diagnosis of SARS-CoV-2 by FDA under an Emergency Use Authorization (EUA). This EUA will remain in effect (meaning this test can be used) for the duration of the COVID-19 declaration under Section 564(b)(1) of the Act, 21 U.S.C. section 360bbb-3(b)(1), unless the authorization is terminated or revoked.  Performed at [facility], [address], [place], [place] [address]   Culture, blood (x 2)     Status: Abnormal   Collection Time: 06/15/21  5:56 PM   Specimen: BLOOD  Result Value Ref Range Status   Specimen Description   Final    BLOOD RIGHT ANTECUBITAL Performed at [facility], [address] [place], [place] [address]    Special Requests   Final    BOTTLES DRAWN AEROBIC AND ANAEROBIC Blood Culture adequate volume Performed at [facility], [address] [place], [place] [address]    Culture  Setup Time   Final    GRAM POSITIVE COCCI IN CLUSTERS IN BOTH AEROBIC AND ANAEROBIC BOTTLES CRITICAL RESULT CALLED TO, READ BACK BY AND VERIFIED WITH: Thana Ates [id number] AT 1226 BY CM Performed at [facility], [address], [place], [place] [address]    Culture STAPHYLOCOCCUS AUREUS (A)  Final   Report Status 06/18/2021 FINAL  Final   Organism ID, Bacteria STAPHYLOCOCCUS  AUREUS  Final      Susceptibility   Staphylococcus aureus - MIC*    CIPROFLOXACIN >=8 RESISTANT Resistant     ERYTHROMYCIN <=0.25 SENSITIVE Sensitive     GENTAMICIN <=0.5 SENSITIVE Sensitive     OXACILLIN <=  0.25 SENSITIVE Sensitive     TETRACYCLINE <=1 SENSITIVE Sensitive     VANCOMYCIN 1 SENSITIVE Sensitive     TRIMETH/SULFA <=10 SENSITIVE Sensitive     CLINDAMYCIN <=0.25 SENSITIVE Sensitive     RIFAMPIN <=0.5 SENSITIVE Sensitive     Inducible Clindamycin NEGATIVE Sensitive     * STAPHYLOCOCCUS AUREUS  Blood Culture ID Panel (Reflexed)     Status: Abnormal   Collection Time: 06/15/21  5:56 PM  Result Value Ref Range Status   Enterococcus faecalis NOT DETECTED NOT DETECTED Final   Enterococcus Faecium NOT DETECTED NOT DETECTED Final   Listeria monocytogenes NOT DETECTED NOT DETECTED Final   Staphylococcus species DETECTED (A) NOT DETECTED Final    Comment: CRITICAL RESULT CALLED TO, READ BACK BY AND VERIFIED WITH: PHARMD J GADHIA [id number] AT 1227 BY CM    Staphylococcus aureus (BCID) DETECTED (A) NOT DETECTED Final    Comment: CRITICAL RESULT CALLED TO, READ BACK BY AND VERIFIED WITH: PHARMD J GADHIA [id number] AT 1226 BY CM    Staphylococcus epidermidis NOT DETECTED NOT DETECTED Final   Staphylococcus lugdunensis NOT DETECTED NOT DETECTED Final   Streptococcus species NOT DETECTED NOT DETECTED Final   Streptococcus agalactiae NOT DETECTED NOT DETECTED Final   Streptococcus pneumoniae NOT DETECTED NOT DETECTED Final   Streptococcus pyogenes NOT DETECTED NOT DETECTED Final   A.calcoaceticus-baumannii NOT DETECTED NOT DETECTED Final   Bacteroides fragilis NOT DETECTED NOT DETECTED Final   Enterobacterales NOT DETECTED NOT DETECTED Final   Enterobacter cloacae complex NOT DETECTED NOT DETECTED Final   Escherichia coli NOT DETECTED NOT DETECTED Final   Klebsiella aerogenes NOT DETECTED NOT DETECTED Final   Klebsiella oxytoca NOT DETECTED NOT DETECTED Final   Klebsiella pneumoniae NOT  DETECTED NOT DETECTED Final   Proteus species NOT DETECTED NOT DETECTED Final   Salmonella species NOT DETECTED NOT DETECTED Final   Serratia marcescens NOT DETECTED NOT DETECTED Final   Haemophilus influenzae NOT DETECTED NOT DETECTED Final   Neisseria meningitidis NOT DETECTED NOT DETECTED Final   Pseudomonas aeruginosa NOT DETECTED NOT DETECTED Final   Stenotrophomonas maltophilia NOT DETECTED NOT DETECTED Final   Candida albicans NOT DETECTED NOT DETECTED Final   Candida auris NOT DETECTED NOT DETECTED Final   Candida glabrata NOT DETECTED NOT DETECTED Final   Candida krusei NOT DETECTED NOT DETECTED Final   Candida parapsilosis NOT DETECTED NOT DETECTED Final   Candida tropicalis NOT DETECTED NOT DETECTED Final   Cryptococcus neoformans/gattii NOT DETECTED NOT DETECTED Final   Meth resistant mecA/C and MREJ NOT DETECTED NOT DETECTED Final    Comment: Performed at [facility], [address] [place], [place] [address]  Culture, blood (x 2)     Status: Abnormal   Collection Time: 06/15/21  6:01 PM   Specimen: BLOOD  Result Value Ref Range Status   Specimen Description   Final    BLOOD BLOOD LEFT HAND Performed at [facility], [address] [place], [place] [address]    Special Requests   Final    BOTTLES DRAWN AEROBIC ONLY Blood Culture adequate volume Performed at [facility], [address] [place], [place] [address]    Culture  Setup Time   Final    GRAM POSITIVE COCCI IN CLUSTERS AEROBIC BOTTLE ONLY CRITICAL VALUE NOTED.  VALUE IS CONSISTENT WITH PREVIOUSLY REPORTED AND CALLED VALUE.    Culture (A)  Final    STAPHYLOCOCCUS AUREUS SUSCEPTIBILITIES PERFORMED ON PREVIOUS CULTURE WITHIN THE LAST 5  DAYS. Performed at [facility], [address] [place], [place] [address]    Report Status 06/18/2021 FINAL  Final  Culture, blood (routine x 2)     Status: None (Preliminary result)   Collection Time: 06/17/21  4:54 AM    Specimen: BLOOD RIGHT HAND  Result Value Ref Range Status   Specimen Description   Final    BLOOD RIGHT HAND Performed at [facility], [address] [place], [place] [address]    Special Requests   Final    BOTTLES DRAWN AEROBIC ONLY Blood Culture results may not be optimal due to an excessive volume of blood received in culture bottles Performed at [facility], [address], [place], [place] [address]    Culture   Final    NO GROWTH 1 DAY Performed at [facility], [address], [place], [place] [address]    Report Status PENDING  Incomplete  Culture, blood (routine x 2)     Status: None (Preliminary result)   Collection Time: 06/17/21  4:55 AM   Specimen: BLOOD LEFT ARM  Result Value Ref Range Status   Specimen Description   Final    BLOOD LEFT ARM Performed at [facility], [address], [place], [place] [address]    Special Requests   Final    BOTTLES DRAWN AEROBIC ONLY Blood Culture adequate volume Performed at [facility], [address], [place], [place] [address]    Culture   Final    NO GROWTH 1 DAY Performed at [facility], [address] [place], [place] [address]    Report Status PENDING  Incomplete      Radiology Studies: DG Wrist Complete Left  Result Date: 06/18/2021 CLINICAL DATA:  Diffuse left wrist pain.  No known injury. EXAM: LEFT WRIST - COMPLETE 3+ VIEW COMPARISON:  None. FINDINGS: No acute bony or joint abnormality is identified. No focal bony lesion. Mild appearing osteoarthritis is seen at the first [facility] and scaphoid trapezium trapezoid joints. No chondrocalcinosis. Soft tissues are negative. IMPRESSION: No acute abnormality. 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 Electronically Signed   By: Drusilla Kanner M.D.   On: 06/18/2021 13:44   DG Abd 2 Views  Result Date: 06/16/2021 CLINICAL DATA:  Abdominal pain and distension EXAM: ABDOMEN - 2  VIEW COMPARISON:  None. FINDINGS: Scattered large and small bowel gas is noted. No free air is seen. No abnormal mass or abnormal calcifications are noted. No bony abnormality is seen. IMPRESSION: Scattered large and small bowel gas which may represent a mild ileus. No definitive obstructive changes are seen. Electronically Signed   By: Alcide Clever M.D.   On: 06/16/2021 19:00    Scheduled Meds:  ceFAZolin (ANCEF) IVPB 2 gram/100 mL NS (Mini-Bag Plus)  2 g Intravenous Q8H   feeding supplement  237 mL Oral BID BM   folic acid  1 mg Oral Daily   multivitamin with minerals  1 tablet Oral Daily   pneumococcal 23 valent vaccine  0.5 mL Intramuscular Tomorrow-1000   simethicone  80 mg Oral QID   thiamine  100 mg Oral Daily   Or   thiamine  100 mg Intravenous Daily   vitamin B-12  1,000 mcg Oral Daily   Continuous Infusions:   LOS: 3 days   Time spent: 35 minutes   Kinzly Pierrelouis Estill Cotta, MD [facility]  If 7PM-7AM, please contact night-coverage [email or web address] 06/18/2021, 1:46  PM

## 2021-06-18 NOTE — Progress Notes (Signed)
  Echocardiogram 2D Echocardiogram has been performed.  Janalyn Harder 06/18/2021, 2:14 PM

## 2021-06-19 DIAGNOSIS — B9561 Methicillin susceptible Staphylococcus aureus infection as the cause of diseases classified elsewhere: Secondary | ICD-10-CM | POA: Diagnosis present

## 2021-06-19 DIAGNOSIS — F1010 Alcohol abuse, uncomplicated: Secondary | ICD-10-CM | POA: Diagnosis present

## 2021-06-19 DIAGNOSIS — R7881 Bacteremia: Secondary | ICD-10-CM | POA: Diagnosis present

## 2021-06-19 LAB — COMPREHENSIVE METABOLIC PANEL
ALT: 31 U/L (ref 0–44)
AST: 39 U/L (ref 15–41)
Albumin: 2.4 g/dL — ABNORMAL LOW (ref 3.5–5.0)
Alkaline Phosphatase: 57 U/L (ref 38–126)
Anion gap: 11 (ref 5–15)
BUN: 6 mg/dL (ref 6–20)
CO2: 32 mmol/L (ref 22–32)
Calcium: 9.1 mg/dL (ref 8.9–10.3)
Chloride: 89 mmol/L — ABNORMAL LOW (ref 98–111)
Creatinine, Ser: 0.43 mg/dL — ABNORMAL LOW (ref 0.61–1.24)
GFR, Estimated: >60 mL/min (ref >60)
Glucose, Bld: 105 mg/dL — ABNORMAL HIGH (ref 70–99)
Potassium: 3 mmol/L — ABNORMAL LOW (ref 3.5–5.1)
Sodium: 132 mmol/L — ABNORMAL LOW (ref 135–145)
Total Bilirubin: 1.1 mg/dL (ref 0.3–1.2)
Total Protein: 5.7 g/dL — ABNORMAL LOW (ref 6.5–8.1)

## 2021-06-19 LAB — CBC WITH DIFFERENTIAL/PLATELET
Abs Immature Granulocytes: 0.16 10*3/uL — ABNORMAL HIGH (ref 0.00–0.07)
Basophils Absolute: 0 10*3/uL (ref 0.0–0.1)
Basophils Relative: 0 %
Eosinophils Absolute: 0.1 10*3/uL (ref 0.0–0.5)
Eosinophils Relative: 1 %
HCT: 38.6 % — ABNORMAL LOW (ref 39.0–52.0)
Hemoglobin: 13.4 g/dL (ref 13.0–17.0)
Immature Granulocytes: 1 %
Lymphocytes Relative: 11 %
Lymphs Abs: 1.5 10*3/uL (ref 0.7–4.0)
MCH: 35.4 pg — ABNORMAL HIGH (ref 26.0–34.0)
MCHC: 34.7 g/dL (ref 30.0–36.0)
MCV: 101.8 fL — ABNORMAL HIGH (ref 80.0–100.0)
Monocytes Absolute: 2.1 10*3/uL — ABNORMAL HIGH (ref 0.1–1.0)
Monocytes Relative: 15 %
Neutro Abs: 10.2 10*3/uL — ABNORMAL HIGH (ref 1.7–7.7)
Neutrophils Relative %: 72 %
Platelets: 188 10*3/uL (ref 150–400)
RBC: 3.79 MIL/uL — ABNORMAL LOW (ref 4.22–5.81)
RDW: 11.7 % (ref 11.5–15.5)
WBC: 14.1 10*3/uL — ABNORMAL HIGH (ref 4.0–10.5)
nRBC: 0 % (ref 0.0–0.2)

## 2021-06-19 LAB — MAGNESIUM: Magnesium: 1.3 mg/dL — ABNORMAL LOW (ref 1.7–2.4)

## 2021-06-19 MED ORDER — MAGNESIUM OXIDE -MG SUPPLEMENT 400 (240 MG) MG PO TABS
400.0000 mg | ORAL_TABLET | Freq: Two times a day (BID) | ORAL | Status: DC
  Administered 2021-06-19 (×2): 400 mg via ORAL
  Filled 2021-06-19 (×2): qty 1

## 2021-06-19 NOTE — Progress Notes (Signed)
PROGRESS NOTE    Joseph Bell  NWG:[id number] DOB: 12-25-1960 DOA: 06/15/2021 PCP: Lavinia Sharps, NP     Brief Narrative:  Patient admitted on 7/12 for generalized weakness.  He was found to have staph aureus bacteremia, rhabdomyolysis and hyponatremia.  He was started on Ancef.  ID saw the patient and recommended transthoracic echo which did not show any evidence of endocarditis.   New events last 24 hours / Subjective: Patient reports continued pain over his arm and ribs.  Films showed chronic fractures, no acute breaks or dislocations.  He is eating and drinking normally without nausea, vomiting, diarrhea, chest pain, or shortness of breath.  Assessment & Plan:   Principal Problem:   MSSA bacteremia Active Problems:   Hyponatremia   Alcohol abuse  MSSA Bacteremia  Appreciate ID  Ancef every 8 hours  Echo unremarkable, will await ID recommendations to see if they want a TEE  Monitor CBC  Hyponatremia  Monitor BMP  Currently stable  Alcohol abuse  CIWA scale  Thiamine, B12, multivitamin, folic acid  Hypokalemia/hypomagnesemia  Replace  Nutrition Problem: Increased nutrient needs Etiology: acute illness   DVT prophylaxis: SCDs Code Status: Full Family Communication: Self Coming From: Homeless Disposition Plan: SNF Barriers to Discharge: Medical work-up, SNF placement  Consultants:  ID  Antimicrobials:  Anti-infectives (From admission, onward)    Start     Dose/Rate Route Frequency Ordered Stop   06/16/21 2200  ceFAZolin (ANCEF) 2 g in sodium chloride 0.9 % 100 mL IVPB        2 g 200 mL/hr over 30 Minutes Intravenous Every 8 hours 06/16/21 1657     06/16/21 1400  ceFAZolin (ANCEF) IVPB 2g/100 mL premix  Status:  Discontinued        2 g 200 mL/hr over 30 Minutes Intravenous Every 8 hours 06/16/21 1248 06/16/21 1657   06/16/21 0800  vancomycin (VANCOCIN) IVPB 1000 mg/200 mL premix  Status:  Discontinued        1,000 mg 200 mL/hr over 60 Minutes  Intravenous Every 12 hours 06/15/21 1835 06/16/21 1248   06/15/21 1930  vancomycin (VANCOREADY) IVPB 1750 mg/350 mL        1,750 mg 175 mL/hr over 120 Minutes Intravenous  Once 06/15/21 1835 06/15/21 2331   06/15/21 1930  ceFEPIme (MAXIPIME) 2 g in sodium chloride 0.9 % 100 mL IVPB  Status:  Discontinued        2 g 200 mL/hr over 30 Minutes Intravenous Every 8 hours 06/15/21 1835 06/16/21 1248        Objective: Vitals:   06/18/21 1306 06/18/21 2013 06/19/21 0505 06/19/21 1301  BP: 117/72 138/76 134/79 133/72  Pulse: 88 (!) 108 91 92  Resp: 16 18 18 18   Temp: 98.3 F (36.8 C) 99.9 F (37.7 C) 98.2 F (36.8 C) 99.3 F (37.4 C)  TempSrc: Oral   Oral  SpO2: 94% 91% 92% (!) 89%  Weight:      Height:        Intake/Output Summary (Last 24 hours) at 06/19/2021 1323 Last data filed at 06/19/2021 0911 Gross per 24 hour  Intake 2017.67 ml  Output 2725 ml  Net -707.33 ml   Filed Weights   06/15/21 1131  Weight: 81.6 kg    Examination:  General exam: Appears calm and comfortable  Respiratory system: Clear to auscultation. Respiratory effort normal. No respiratory distress. No conversational dyspnea.  Cardiovascular system: S1 & S2 heard, RRR. No murmurs. No pedal edema. Gastrointestinal system:  Abdomen is nondistended, soft and nontender. Normal bowel sounds heard. Central nervous system: No focal neurological deficits. Speech clear.  Extremities: Symmetric in appearance  Skin: No rashes, lesions or ulcers on exposed skin  Psychiatry: Mood & affect appropriate.   Data Reviewed: I have personally reviewed following labs and imaging studies  CBC: Recent Labs  Lab 06/15/21 1143 06/15/21 1755 06/16/21 0534 06/16/21 0811 06/17/21 0454 06/18/21 0447 06/19/21 0520  WBC 13.7* 11.9* 11.5*  --  12.3* 14.1* 14.1*  NEUTROABS 11.7* 10.1*  --   --   --   --  10.2*  HGB 14.0 14.7 13.5  --  12.8* 12.8* 13.4  HCT 38.2* 41.6 39.0  --  36.2* 35.9* 38.6*  MCV 99.0 103.2* 105.7*  --   103.7* 101.7* 101.8*  PLT 86* 40* 78* 80* 96* 136* 188   Basic Metabolic Panel: Recent Labs  Lab 06/15/21 1143 06/15/21 1755 06/15/21 1802 06/16/21 0534 06/16/21 0811 06/17/21 0612 06/17/21 1433 06/18/21 0447 06/18/21 1430 06/19/21 0520  NA 125*   < > 128* 128* 128* 129* 130* 131*  --  132*  K 3.0*   < > 3.2* 3.0* 3.3* 2.6* 3.0* 2.9*  --  3.0*  CL 89*   < > 93* 97* 97* 96* 93* 92*  --  89*  CO2 24   < > 20* 32  --  32  GLUCOSE 110*   < > 91 101* 113* 103* 134* 110*  --  105*  BUN 15   < > <5*  --  6  CREATININE 0.70   < > 0.55* 0.56* 0.48* 0.43* 0.57* 0.33*  --  0.43*  CALCIUM 8.5*   < > 8.1* 7.8* 7.8* 7.9* 8.4* 8.4*  --  9.1  MG 1.9  --   --   --   --  1.7  --   --  1.5* 1.3*  PHOS  --   --  2.6 2.5 2.6  --   --   --   --   --    < > = values in this interval not displayed.   GFR: Estimated Creatinine Clearance: 96.5 mL/min (A) (by C-G formula based on SCr of 0.43 mg/dL (L)). Liver Function Tests: Recent Labs  Lab 06/15/21 1755 06/15/21 1802 06/16/21 0534 06/16/21 0811 06/17/21 0612 06/18/21 0447 06/19/21 0520  AST 255*  --   --  168* 103* 67* 39  ALT 85*  --   --  68* 55* 42 31  ALKPHOS 59  --   --  40 43 44 57  BILITOT 1.6*  --   --  0.8 0.8 0.9 1.1  PROT 6.7  --   --  5.8* 5.1* 5.1* 5.7*  ALBUMIN 3.1*   < > 2.8* 2.6*  2.6* 2.2* 2.2* 2.4*   < > = values in this interval not displayed.   Coagulation Profile: Recent Labs  Lab 06/15/21 1755 06/16/21 0811  INR 1.0 1.1   Cardiac Enzymes: Recent Labs  Lab 06/15/21 1143 06/18/21 0447  CKTOTAL 5,981* 179   Sepsis Labs: Recent Labs  Lab 06/15/21 1755 06/15/21 1918 06/16/21 0811 06/17/21 0612  PROCALCITON  --   --  1.23 0.84  LATICACIDVEN 1.6 1.7  --   --     Recent Results (from the past 240 hour(s))  Resp Panel by RT-PCR (Flu A&B, Covid) Nasopharyngeal Swab     Status: None   Collection Time: 06/15/21  1:47 PM   Specimen: Nasopharyngeal Swab; Nasopharyngeal(NP) swabs in  vial transport medium  Result Value Ref Range Status   SARS Coronavirus 2 by RT PCR NEGATIVE NEGATIVE Final    Comment: (NOTE) SARS-CoV-2 target nucleic acids are NOT DETECTED.  The SARS-CoV-2 RNA is generally detectable in upper respiratory specimens during the acute phase of infection. The lowest concentration of SARS-CoV-2 viral copies this assay can detect is 138 copies/mL. A negative result does not preclude SARS-Cov-2 infection and should not be used as the sole basis for treatment or other patient management decisions. A negative result may occur with  improper specimen collection/handling, submission of specimen other than nasopharyngeal swab, presence of viral mutation(s) within the areas targeted by this assay, and inadequate number of viral copies(<138 copies/mL). A negative result must be combined with clinical observations, patient history, and epidemiological information. The expected result is Negative.  Fact Sheet for Patients:  BloggerCourse.comhttps://www.fda.gov/media/152166/download  Fact Sheet for Healthcare Providers:  SeriousBroker.ithttps://www.fda.gov/media/152162/download  This test is no t yet approved or cleared by the [place] FDA and  has been authorized for detection and/or diagnosis of SARS-CoV-2 by FDA under an Emergency Use Authorization (EUA). This EUA will remain  in effect (meaning this test can be used) for the duration of the COVID-19 declaration under Section 564(b)(1) of the Act, 21 U.S.C.section 360bbb-3(b)(1), unless the authorization is terminated  or revoked sooner.       Influenza A by PCR NEGATIVE NEGATIVE Final   Influenza B by PCR NEGATIVE NEGATIVE Final    Comment: (NOTE) The Xpert Xpress SARS-CoV-2/FLU/RSV plus assay is intended as an aid in the diagnosis of influenza from Nasopharyngeal swab specimens and should not be used as a sole basis for treatment. Nasal washings and aspirates are unacceptable for Xpert Xpress SARS-CoV-2/FLU/RSV testing.  Fact  Sheet for Patients: BloggerCourse.comhttps://www.fda.gov/media/152166/download  Fact Sheet for Healthcare Providers: SeriousBroker.ithttps://www.fda.gov/media/152162/download  This test is not yet approved or cleared by the [place] FDA and has been authorized for detection and/or diagnosis of SARS-CoV-2 by FDA under an Emergency Use Authorization (EUA). This EUA will remain in effect (meaning this test can be used) for the duration of the COVID-19 declaration under Section 564(b)(1) of the Act, 21 U.S.C. section 360bbb-3(b)(1), unless the authorization is terminated or revoked.  Performed at [facility], [address] [place], [place] [address]   Culture, blood (x 2)     Status: Abnormal   Collection Time: 06/15/21  5:56 PM   Specimen: BLOOD  Result Value Ref Range Status   Specimen Description   Final    BLOOD RIGHT ANTECUBITAL Performed at [facility], [address] [place], [place] [address]    Special Requests   Final    BOTTLES DRAWN AEROBIC AND ANAEROBIC Blood Culture adequate volume Performed at [facility], [address] [place], [place] [address]    Culture  Setup Time   Final    GRAM POSITIVE COCCI IN CLUSTERS IN BOTH AEROBIC AND ANAEROBIC BOTTLES CRITICAL RESULT CALLED TO, READ BACK BY AND VERIFIED WITH: Thana AtesHARMD J GADHIA [id number] AT 1226 BY CM Performed at [facility], [address] [place], [place] [address]    Culture STAPHYLOCOCCUS AUREUS (A)  Final   Report Status 06/18/2021 FINAL  Final   Organism ID, Bacteria STAPHYLOCOCCUS AUREUS  Final      Susceptibility   Staphylococcus aureus - MIC*    CIPROFLOXACIN >=8 RESISTANT Resistant     ERYTHROMYCIN <=0.25 SENSITIVE  Sensitive     GENTAMICIN <=0.5 SENSITIVE Sensitive     OXACILLIN <=0.25 SENSITIVE Sensitive     TETRACYCLINE <=1 SENSITIVE Sensitive     VANCOMYCIN 1 SENSITIVE Sensitive     TRIMETH/SULFA <=10 SENSITIVE Sensitive     CLINDAMYCIN <=0.25 SENSITIVE  Sensitive     RIFAMPIN <=0.5 SENSITIVE Sensitive     Inducible Clindamycin NEGATIVE Sensitive     * STAPHYLOCOCCUS AUREUS  Blood Culture ID Panel (Reflexed)     Status: Abnormal   Collection Time: 06/15/21  5:56 PM  Result Value Ref Range Status   Enterococcus faecalis NOT DETECTED NOT DETECTED Final   Enterococcus Faecium NOT DETECTED NOT DETECTED Final   Listeria monocytogenes NOT DETECTED NOT DETECTED Final   Staphylococcus species DETECTED (A) NOT DETECTED Final    Comment: CRITICAL RESULT CALLED TO, READ BACK BY AND VERIFIED WITH: PHARMD J GADHIA [id number] AT 1227 BY CM    Staphylococcus aureus (BCID) DETECTED (A) NOT DETECTED Final    Comment: CRITICAL RESULT CALLED TO, READ BACK BY AND VERIFIED WITH: PHARMD J GADHIA [id number] AT 1226 BY CM    Staphylococcus epidermidis NOT DETECTED NOT DETECTED Final   Staphylococcus lugdunensis NOT DETECTED NOT DETECTED Final   Streptococcus species NOT DETECTED NOT DETECTED Final   Streptococcus agalactiae NOT DETECTED NOT DETECTED Final   Streptococcus pneumoniae NOT DETECTED NOT DETECTED Final   Streptococcus pyogenes NOT DETECTED NOT DETECTED Final   A.calcoaceticus-baumannii NOT DETECTED NOT DETECTED Final   Bacteroides fragilis NOT DETECTED NOT DETECTED Final   Enterobacterales NOT DETECTED NOT DETECTED Final   Enterobacter cloacae complex NOT DETECTED NOT DETECTED Final   Escherichia coli NOT DETECTED NOT DETECTED Final   Klebsiella aerogenes NOT DETECTED NOT DETECTED Final   Klebsiella oxytoca NOT DETECTED NOT DETECTED Final   Klebsiella pneumoniae NOT DETECTED NOT DETECTED Final   Proteus species NOT DETECTED NOT DETECTED Final   Salmonella species NOT DETECTED NOT DETECTED Final   Serratia marcescens NOT DETECTED NOT DETECTED Final   Haemophilus influenzae NOT DETECTED NOT DETECTED Final   Neisseria meningitidis NOT DETECTED NOT DETECTED Final   Pseudomonas aeruginosa NOT DETECTED NOT DETECTED Final   Stenotrophomonas maltophilia NOT  DETECTED NOT DETECTED Final   Candida albicans NOT DETECTED NOT DETECTED Final   Candida auris NOT DETECTED NOT DETECTED Final   Candida glabrata NOT DETECTED NOT DETECTED Final   Candida krusei NOT DETECTED NOT DETECTED Final   Candida parapsilosis NOT DETECTED NOT DETECTED Final   Candida tropicalis NOT DETECTED NOT DETECTED Final   Cryptococcus neoformans/gattii NOT DETECTED NOT DETECTED Final   Meth resistant mecA/C and MREJ NOT DETECTED NOT DETECTED Final    Comment: Performed at [facility], [address], [place], [place] [address]  Culture, blood (x 2)     Status: Abnormal   Collection Time: 06/15/21  6:01 PM   Specimen: BLOOD  Result Value Ref Range Status   Specimen Description   Final    BLOOD BLOOD LEFT HAND Performed at [facility], [address], [place], [place] [address]    Special Requests   Final    BOTTLES DRAWN AEROBIC ONLY Blood Culture adequate volume Performed at [facility], [address], [place], [place] [address]    Culture  Setup Time   Final    GRAM POSITIVE COCCI IN CLUSTERS AEROBIC BOTTLE ONLY CRITICAL VALUE NOTED.  VALUE IS CONSISTENT WITH PREVIOUSLY REPORTED AND CALLED VALUE.    Culture (A)  Final    STAPHYLOCOCCUS AUREUS SUSCEPTIBILITIES PERFORMED ON PREVIOUS CULTURE WITHIN THE LAST 5 DAYS. Performed at [facility], [address] [place], [place] [address]    Report Status 06/18/2021 FINAL  Final  Culture, blood (routine x 2)     Status: None (Preliminary result)   Collection Time: 06/17/21  4:54 AM   Specimen: BLOOD RIGHT HAND  Result Value Ref Range Status   Specimen Description   Final    BLOOD RIGHT HAND Performed at [facility], [address], [place], [place] [address]    Special Requests   Final    BOTTLES DRAWN AEROBIC ONLY Blood Culture results may not be optimal due to an excessive volume of blood received in culture bottles Performed at [facility], [address] [place], [place] [address]    Culture   Final    NO GROWTH 1 DAY Performed at [facility], [address], [place], [place] [address]    Report Status PENDING  Incomplete  Culture, blood (routine x 2)     Status: None (Preliminary result)   Collection Time: 06/17/21  4:55 AM   Specimen: BLOOD LEFT ARM  Result Value Ref Range Status   Specimen Description   Final    BLOOD LEFT ARM Performed at [facility], [address] [place], [place] [address]    Special Requests   Final    BOTTLES DRAWN AEROBIC ONLY Blood Culture adequate volume Performed at [facility], [address], [place], [place] [address]    Culture   Final    NO GROWTH 1 DAY Performed at [facility], [address] [place], [place] [address]    Report Status PENDING  Incomplete      Radiology Studies: DG Wrist Complete Left  Result Date: 06/18/2021 CLINICAL DATA:  Diffuse left wrist pain.  No known injury. EXAM: LEFT WRIST - COMPLETE 3+ VIEW COMPARISON:  None. FINDINGS: No acute bony or joint abnormality is identified. No focal bony lesion. Mild appearing osteoarthritis is seen at the first [facility] and scaphoid trapezium trapezoid joints. No chondrocalcinosis. Soft tissues are negative. IMPRESSION: No acute abnormality. 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 Electronically Signed   By: Drusilla Kanner M.D.   On: 06/18/2021 13:44   ECHOCARDIOGRAM COMPLETE  Result Date: 06/18/2021    ECHOCARDIOGRAM REPORT   Patient Name:   DAIEL STROHECKER [facility] Date of Exam: 06/18/2021 Medical Rec #:  [id number]          Height:       65.0 in Accession #:    [id number]         Weight:       179.9 lb Date of Birth:  August 19, 1961           BSA:          1.891 m Patient Age:    59 years           BP:           132/69 mmHg Patient Gender: M                  HR:           87 bpm. Exam Location:  Inpatient Procedure: 2D Echo Indications:    Bacteremia   History:        Patient has no prior history of Echocardiogram examinations.  Signs/Symptoms:Altered Mental Status, Dyspnea and Shortness of                 Breath. ETOH. Rhabdomyolysis.  Sonographer:    Sheralyn Boatman RDCS Referring Phys: [phone] CYNTHIA SNIDER  Sonographer Comments: Technically difficult study due to poor echo windows. Very difficult study. Patient was moving and could not follow directions to valsalva or hold breath. IMPRESSIONS  1. 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 Peak gradient ~28 mmHG. There is no asymmetric hypertrophy or evidence of mitral valve SAM. There are no findings to suggest hypertrophic cardiomyopathy. These findings are related to the hyperdynamic LV function and small LV cavity. Left ventricular ejection fraction, by estimation, is >75%. The left ventricle has hyperdynamic function. The left ventricle has no regional wall motion abnormalities. There is mild concentric left ventricular hypertrophy. Left ventricular diastolic parameters are consistent with Grade I diastolic dysfunction (impaired relaxation).  2. Right ventricular systolic function is normal. The right ventricular size is normal. Tricuspid regurgitation signal is inadequate for assessing PA pressure.  3. The mitral valve is grossly normal. Mild mitral valve regurgitation. No evidence of mitral stenosis.  4. The aortic valve is grossly normal. There is mild calcification of the aortic valve. Aortic valve regurgitation is mild. Mild to moderate aortic valve sclerosis/calcification is present, without any evidence of aortic stenosis.  5. There is mild dilatation of the ascending aorta, measuring 40 mm.  6. The inferior vena cava is normal in size with greater than 50% respiratory variability, suggesting right atrial pressure of 3 mmHg. Conclusion(s)/Recommendation(s): No evidence of valvular vegetations on this transthoracic  echocardiogram. Would recommend a transesophageal echocardiogram to exclude infective endocarditis if clinically indicated. FINDINGS  Left Ventricle: There is an intracavitary gradient due to chordal SAM from a small underfilled LV with hyperdynamic function. Peak gradient ~28 mmHG. There is no asymmetric hypertrophy or evidence of mitral valve SAM. There are no findings to suggest hypertrophic cardiomyopathy. These findings are related to the hyperdynamic LV function and small LV cavity. Left ventricular ejection fraction, by estimation, is >75%. The left ventricle has hyperdynamic function. The left ventricle has no regional wall  motion abnormalities. The left ventricular internal cavity size was small. There is mild concentric left ventricular hypertrophy. Left ventricular diastolic parameters are consistent with Grade I diastolic dysfunction (impaired relaxation). Right Ventricle: The right ventricular size is normal. No increase in right ventricular wall thickness. Right ventricular systolic function is normal. Tricuspid regurgitation signal is inadequate for assessing PA pressure. Left Atrium: Left atrial size was normal in size. Right Atrium: Right atrial size was normal in size. Pericardium: Trivial pericardial effusion is present. Presence of pericardial fat pad. Mitral Valve: The mitral valve is grossly normal. Mild mitral valve regurgitation. No evidence of mitral valve stenosis. Tricuspid Valve: The tricuspid valve is grossly normal. Tricuspid valve regurgitation is trivial. No evidence of tricuspid stenosis. Aortic Valve: The aortic valve is grossly normal. There is mild calcification of the aortic valve. Aortic valve regurgitation is mild. Aortic regurgitation PHT measures 379 msec. Mild to moderate aortic valve sclerosis/calcification is present, without any evidence of aortic stenosis. Aortic valve mean gradient measures 10.8 mmHg. Aortic valve peak gradient measures 18.3 mmHg. Aortic valve area, by  VTI measures 1.87 cm. Pulmonic Valve: The pulmonic valve was grossly normal. Pulmonic valve regurgitation is not visualized. No evidence of pulmonic stenosis. Aorta: The aortic root is normal in size and structure. There is mild dilatation  of the ascending aorta, measuring 40 mm. Venous: The inferior vena cava is normal in size with greater than 50% respiratory variability, suggesting right atrial pressure of 3 mmHg. IAS/Shunts: The atrial septum is grossly normal.  LEFT VENTRICLE PLAX 2D LVIDd:         3.70 cm      Diastology LVIDs:         2.40 cm      LV e' medial:    5.98 cm/s LV PW:         1.60 cm      LV E/e' medial:  15.3 LV IVS:        1.40 cm      LV e' lateral:   8.05 cm/s LVOT diam:     1.60 cm      LV E/e' lateral: 11.4 LV SV:         79 LV SV Index:   42 LVOT Area:     2.01 cm  LV Volumes (MOD) LV vol d, MOD A2C: 102.0 ml LV vol d, MOD A4C: 82.2 ml LV vol s, MOD A2C: 40.7 ml LV vol s, MOD A4C: 35.4 ml LV SV MOD A2C:     61.4 ml LV SV MOD A4C:     82.2 ml LV SV MOD BP:      58.6 ml RIGHT VENTRICLE             IVC RV S prime:     12.20 cm/s  IVC diam: 1.40 cm TAPSE (M-mode): 2.1 cm LEFT ATRIUM             Index       RIGHT ATRIUM           Index LA diam:        2.80 cm 1.48 cm/m  RA Area:     11.80 cm LA Vol (A2C):   29.1 ml 15.39 ml/m RA Volume:   23.60 ml  12.48 ml/m LA Vol (A4C):   23.2 ml 12.27 ml/m LA Biplane Vol: 26.5 ml 14.01 ml/m  AORTIC VALVE AV Area (Vmax):    1.97 cm AV Area (Vmean):   1.89 cm AV Area (VTI):     1.87 cm AV Vmax:           214.00 cm/s AV Vmean:          151.750 cm/s AV VTI:            0.423 m AV Peak Grad:      18.3 mmHg AV Mean Grad:      10.8 mmHg LVOT Vmax:         210.00 cm/s LVOT Vmean:        143.000 cm/s LVOT VTI:          0.393 m LVOT/AV VTI ratio: 0.93 AI PHT:            379 msec  AORTA Ao Root diam: 3.60 cm Ao Asc diam:  4.00 cm MITRAL VALVE MV Area (PHT): 4.15 cm     SHUNTS MV Decel Time: 183 msec     Systemic VTI:  0.39 m MV E velocity: 91.70 cm/s    Systemic Diam: 1.60 cm MV A velocity: 110.00 cm/s MV E/A ratio:  0.83 Lennie Odor MD Electronically signed by Lennie Odor MD Signature Date/Time: 06/18/2021/3:25:04 PM    Final      Scheduled Meds:  ceFAZolin (ANCEF) IVPB 2 gram/100 mL NS (Mini-Bag Plus)  2 g Intravenous Q8H  feeding supplement  237 mL Oral BID BM   folic acid  1 mg Oral Daily   magnesium oxide  400 mg Oral BID   multivitamin with minerals  1 tablet Oral Daily   pneumococcal 23 valent vaccine  0.5 mL Intramuscular Tomorrow-1000   potassium chloride  40 mEq Oral BID   simethicone  80 mg Oral QID   thiamine  100 mg Oral Daily   Or   thiamine  100 mg Intravenous Daily   vitamin B-12  1,000 mcg Oral Daily   Continuous Infusions:   LOS: 4 days    Time spent: 30 minutes   Sharlene Dory, DO [facility] 06/19/2021, 1:23 PM   Available via Epic secure chat 7am-7pm After these hours, please refer to coverage provider listed on [email or web address]

## 2021-06-20 LAB — COMPREHENSIVE METABOLIC PANEL
ALT: 23 U/L (ref 0–44)
AST: 31 U/L (ref 15–41)
Albumin: 2.2 g/dL — ABNORMAL LOW (ref 3.5–5.0)
Alkaline Phosphatase: 61 U/L (ref 38–126)
Anion gap: 10 (ref 5–15)
BUN: 7 mg/dL (ref 6–20)
CO2: 26 mmol/L (ref 22–32)
Calcium: 8.5 mg/dL — ABNORMAL LOW (ref 8.9–10.3)
Chloride: 92 mmol/L — ABNORMAL LOW (ref 98–111)
Creatinine, Ser: 0.48 mg/dL — ABNORMAL LOW (ref 0.61–1.24)
GFR, Estimated: >60 mL/min (ref >60)
Glucose, Bld: 108 mg/dL — ABNORMAL HIGH (ref 70–99)
Potassium: 3 mmol/L — ABNORMAL LOW (ref 3.5–5.1)
Sodium: 128 mmol/L — ABNORMAL LOW (ref 135–145)
Total Bilirubin: 1.2 mg/dL (ref 0.3–1.2)
Total Protein: 5.5 g/dL — ABNORMAL LOW (ref 6.5–8.1)

## 2021-06-20 LAB — MAGNESIUM: Magnesium: 1.3 mg/dL — ABNORMAL LOW (ref 1.7–2.4)

## 2021-06-20 LAB — CBC
HCT: 35.8 % — ABNORMAL LOW (ref 39.0–52.0)
Hemoglobin: 12.6 g/dL — ABNORMAL LOW (ref 13.0–17.0)
MCH: 35.7 pg — ABNORMAL HIGH (ref 26.0–34.0)
MCHC: 35.2 g/dL (ref 30.0–36.0)
MCV: 101.4 fL — ABNORMAL HIGH (ref 80.0–100.0)
Platelets: 212 10*3/uL (ref 150–400)
RBC: 3.53 MIL/uL — ABNORMAL LOW (ref 4.22–5.81)
RDW: 11.8 % (ref 11.5–15.5)
WBC: 13.4 10*3/uL — ABNORMAL HIGH (ref 4.0–10.5)
nRBC: 0 % (ref 0.0–0.2)

## 2021-06-20 MED ORDER — MAGNESIUM OXIDE -MG SUPPLEMENT 400 (240 MG) MG PO TABS
800.0000 mg | ORAL_TABLET | Freq: Two times a day (BID) | ORAL | Status: AC
  Administered 2021-06-20 – 2021-06-21 (×3): 800 mg via ORAL
  Filled 2021-06-20 (×3): qty 2

## 2021-06-20 MED ORDER — POTASSIUM CHLORIDE CRYS ER 10 MEQ PO TBCR
40.0000 meq | EXTENDED_RELEASE_TABLET | Freq: Three times a day (TID) | ORAL | Status: AC
  Administered 2021-06-20 – 2021-06-21 (×3): 40 meq via ORAL
  Filled 2021-06-20 (×3): qty 4

## 2021-06-20 NOTE — Progress Notes (Signed)
PROGRESS NOTE    Joseph Bell  ZDG:[id number] DOB: [birth year] DOA: 06/15/2021 PCP: Lavinia Sharps, NP     Brief Narrative:  Patient admitted on 7/12 for generalized weakness.  He was found to have staph aureus bacteremia, rhabdomyolysis and hyponatremia.  He was started on Ancef.  ID saw the patient and recommended transthoracic echo which did not show any evidence of endocarditis.   New events last 24 hours / Subjective: Patient reports feeling hungry but well overall.  He requires a TEE which will unfortunately not be done this weekend.  No fevers or pain.  Assessment & Plan:   Principal Problem:   MSSA bacteremia  Appreciate ID  A message has been sent to set up a TEE  Monitor CBC  Cont Ancef q 8 hrs  Active Problems:   Hyponatremia  Monitor BMP     Alcohol abuse  Thiamine, B12, multivitamin, folic acid    Hypokalemia/mag  Replace, monitor   Nutrition Problem: Increased nutrient needs Etiology: acute illness  DVT prophylaxis: SCDs Code Status: Full Family Communication: Self, he did not have any family members he wished for me to update Coming From: Homeless Disposition Plan: SNF Barriers to Discharge: Medical work-up needs, SNF placement   Consultants:  ID  Antimicrobials:  Anti-infectives (From admission, onward)    Start     Dose/Rate Route Frequency Ordered Stop   06/16/21 2200  ceFAZolin (ANCEF) 2 g in sodium chloride 0.9 % 100 mL IVPB        2 g 200 mL/hr over 30 Minutes Intravenous Every 8 hours 06/16/21 1657     06/16/21 1400  ceFAZolin (ANCEF) IVPB 2g/100 mL premix  Status:  Discontinued        2 g 200 mL/hr over 30 Minutes Intravenous Every 8 hours 06/16/21 1248 06/16/21 1657   06/16/21 0800  vancomycin (VANCOCIN) IVPB 1000 mg/200 mL premix  Status:  Discontinued        1,000 mg 200 mL/hr over 60 Minutes Intravenous Every 12 hours 06/15/21 1835 06/16/21 1248   06/15/21 1930  vancomycin (VANCOREADY) IVPB 1750 mg/350 mL        1,750  mg 175 mL/hr over 120 Minutes Intravenous  Once 06/15/21 1835 06/15/21 2331   06/15/21 1930  ceFEPIme (MAXIPIME) 2 g in sodium chloride 0.9 % 100 mL IVPB  Status:  Discontinued        2 g 200 mL/hr over 30 Minutes Intravenous Every 8 hours 06/15/21 1835 06/16/21 1248        Objective: Vitals:   06/19/21 0505 06/19/21 1301 06/19/21 2043 06/20/21 0456  BP: 134/79 133/72 125/78 117/71  Pulse: 91 92 96 91  Resp: 18 18 18 18   Temp: 98.2 F (36.8 C) 99.3 F (37.4 C) 98 F (36.7 C) 98.4 F (36.9 C)  TempSrc:  Oral    SpO2: 92% (!) 89% 92% 93%  Weight:      Height:        Intake/Output Summary (Last 24 hours) at 06/20/2021 1247 Last data filed at 06/20/2021 0900 Gross per 24 hour  Intake 1309 ml  Output 4050 ml  Net -2741 ml   Filed Weights   06/15/21 1131  Weight: 81.6 kg    Examination:  General exam: Appears calm and comfortable  Respiratory system: Clear to auscultation. Respiratory effort normal. No respiratory distress. No conversational dyspnea.  Cardiovascular system: S1 & S2 heard, RRR. No murmurs. No pedal edema. Gastrointestinal system: Abdomen is mildly distended, soft and nontender.  Normal bowel sounds heard. Skin: No rashes, lesions or ulcers on exposed skin  Psychiatry: Mood & affect appropriate.   Data Reviewed: I have personally reviewed following labs and imaging studies  CBC: Recent Labs  Lab 06/15/21 1143 06/15/21 1755 06/16/21 0534 06/16/21 0811 06/17/21 0454 06/18/21 0447 06/19/21 0520 06/20/21 0549  WBC 13.7* 11.9* 11.5*  --  12.3* 14.1* 14.1* 13.4*  NEUTROABS 11.7* 10.1*  --   --   --   --  10.2*  --   HGB 14.0 14.7 13.5  --  12.8* 12.8* 13.4 12.6*  HCT 38.2* 41.6 39.0  --  36.2* 35.9* 38.6* 35.8*  MCV 99.0 103.2* 105.7*  --  103.7* 101.7* 101.8* 101.4*  PLT 86* 40* 78* 80* 96* 136* 188 212   Basic Metabolic Panel: Recent Labs  Lab 06/15/21 1143 06/15/21 1755 06/15/21 1802 06/16/21 0534 06/16/21 0811 06/17/21 0612 06/17/21 1433  06/18/21 0447 06/18/21 1430 06/19/21 0520 06/20/21 0549  NA 125*   < > 128* 128* 128* 129* 130* 131*  --  132* 128*  K 3.0*   < > 3.2* 3.0* 3.3* 2.6* 3.0* 2.9*  --  3.0* 3.0*  CL 89*   < > 93* 97* 97* 96* 93* 92*  --  89* 92*  CO2 24   < > 20* 22 24 26 29  32  --  32 26  GLUCOSE 110*   < > 91 101* 113* 103* 134* 110*  --  105* 108*  BUN 15   < > 10 11 10 6 6  <5*  --  6 7  CREATININE 0.70   < > 0.55* 0.56* 0.48* 0.43* 0.57* 0.33*  --  0.43* 0.48*  CALCIUM 8.5*   < > 8.1* 7.8* 7.8* 7.9* 8.4* 8.4*  --  9.1 8.5*  MG 1.9  --   --   --   --  1.7  --   --  1.5* 1.3* 1.3*  PHOS  --   --  2.6 2.5 2.6  --   --   --   --   --   --    < > = values in this interval not displayed.   GFR: Estimated Creatinine Clearance: 96.5 mL/min (A) (by C-G formula based on SCr of 0.48 mg/dL (L)).  Liver Function Tests: Recent Labs  Lab 06/16/21 0811 06/17/21 0612 06/18/21 0447 06/19/21 0520 06/20/21 0549  AST 168* 103* 67* 39 31  ALT 68* 55* 42 31 23  ALKPHOS 40 43 44 57 61  BILITOT 0.8 0.8 0.9 1.1 1.2  PROT 5.8* 5.1* 5.1* 5.7* 5.5*  ALBUMIN 2.6*  2.6* 2.2* 2.2* 2.4* 2.2*   Coagulation Profile: Recent Labs  Lab 06/15/21 1755 06/16/21 0811  INR 1.0 1.1   Cardiac Enzymes: Recent Labs  Lab 06/15/21 1143 06/18/21 0447  CKTOTAL 5,981* 179   Sepsis Labs: Recent Labs  Lab 06/15/21 1755 06/15/21 1918 06/16/21 0811 06/17/21 0612  PROCALCITON  --   --  1.23 0.84  LATICACIDVEN 1.6 1.7  --   --     Recent Results (from the past 240 hour(s))  Resp Panel by RT-PCR (Flu A&B, Covid) Nasopharyngeal Swab     Status: None   Collection Time: 06/15/21  1:47 PM   Specimen: Nasopharyngeal Swab; Nasopharyngeal(NP) swabs in vial transport medium  Result Value Ref Range Status   SARS Coronavirus 2 by RT PCR NEGATIVE NEGATIVE Final    Comment: (NOTE) SARS-CoV-2 target nucleic acids are NOT DETECTED.  The SARS-CoV-2 RNA  is generally detectable in upper respiratory specimens during the acute phase of  infection. The lowest concentration of SARS-CoV-2 viral copies this assay can detect is 138 copies/mL. A negative result does not preclude SARS-Cov-2 infection and should not be used as the sole basis for treatment or other patient management decisions. A negative result may occur with  improper specimen collection/handling, submission of specimen other than nasopharyngeal swab, presence of viral mutation(s) within the areas targeted by this assay, and inadequate number of viral copies(<138 copies/mL). A negative result must be combined with clinical observations, patient history, and epidemiological information. The expected result is Negative.  Fact Sheet for Patients:  BloggerCourse.com  Fact Sheet for Healthcare Providers:  SeriousBroker.it  This test is no t yet approved or cleared by the [place] FDA and  has been authorized for detection and/or diagnosis of SARS-CoV-2 by FDA under an Emergency Use Authorization (EUA). This EUA will remain  in effect (meaning this test can be used) for the duration of the COVID-19 declaration under Section 564(b)(1) of the Act, 21 U.S.C.section 360bbb-3(b)(1), unless the authorization is terminated  or revoked sooner.       Influenza A by PCR NEGATIVE NEGATIVE Final   Influenza B by PCR NEGATIVE NEGATIVE Final    Comment: (NOTE) The Xpert Xpress SARS-CoV-2/FLU/RSV plus assay is intended as an aid in the diagnosis of influenza from Nasopharyngeal swab specimens and should not be used as a sole basis for treatment. Nasal washings and aspirates are unacceptable for Xpert Xpress SARS-CoV-2/FLU/RSV testing.  Fact Sheet for Patients: BloggerCourse.com  Fact Sheet for Healthcare Providers: SeriousBroker.it  This test is not yet approved or cleared by the [place] FDA and has been authorized for detection and/or diagnosis of SARS-CoV-2  by FDA under an Emergency Use Authorization (EUA). This EUA will remain in effect (meaning this test can be used) for the duration of the COVID-19 declaration under Section 564(b)(1) of the Act, 21 U.S.C. section 360bbb-3(b)(1), unless the authorization is terminated or revoked.  Performed at [facility], [address], [place], [place] [address]   Culture, blood (x 2)     Status: Abnormal   Collection Time: 06/15/21  5:56 PM   Specimen: BLOOD  Result Value Ref Range Status   Specimen Description   Final    BLOOD RIGHT ANTECUBITAL Performed at [facility], [address] [place], [place] [address]    Special Requests   Final    BOTTLES DRAWN AEROBIC AND ANAEROBIC Blood Culture adequate volume Performed at [facility], [address], [place], [place] [address]    Culture  Setup Time   Final    GRAM POSITIVE COCCI IN CLUSTERS IN BOTH AEROBIC AND ANAEROBIC BOTTLES CRITICAL RESULT CALLED TO, READ BACK BY AND VERIFIED WITH: Thana Ates [id number] AT 1226 BY CM Performed at [facility], [address] [place], [place] [address]    Culture STAPHYLOCOCCUS AUREUS (A)  Final   Report Status 06/18/2021 FINAL  Final   Organism ID, Bacteria STAPHYLOCOCCUS AUREUS  Final      Susceptibility   Staphylococcus aureus - MIC*    CIPROFLOXACIN >=8 RESISTANT Resistant     ERYTHROMYCIN <=0.25 SENSITIVE Sensitive     GENTAMICIN <=0.5 SENSITIVE Sensitive     OXACILLIN <=0.25 SENSITIVE Sensitive     TETRACYCLINE <=1 SENSITIVE Sensitive     VANCOMYCIN 1 SENSITIVE Sensitive     TRIMETH/SULFA <=10 SENSITIVE Sensitive     CLINDAMYCIN <=  0.25 SENSITIVE Sensitive     RIFAMPIN <=0.5 SENSITIVE Sensitive     Inducible Clindamycin NEGATIVE Sensitive     * STAPHYLOCOCCUS AUREUS  Blood Culture ID Panel (Reflexed)     Status: Abnormal   Collection Time: 06/15/21  5:56 PM  Result Value Ref Range Status   Enterococcus faecalis NOT DETECTED  NOT DETECTED Final   Enterococcus Faecium NOT DETECTED NOT DETECTED Final   Listeria monocytogenes NOT DETECTED NOT DETECTED Final   Staphylococcus species DETECTED (A) NOT DETECTED Final    Comment: CRITICAL RESULT CALLED TO, READ BACK BY AND VERIFIED WITH: PHARMD J GADHIA [id number] AT 1227 BY CM    Staphylococcus aureus (BCID) DETECTED (A) NOT DETECTED Final    Comment: CRITICAL RESULT CALLED TO, READ BACK BY AND VERIFIED WITH: PHARMD J GADHIA [id number] AT 1226 BY CM    Staphylococcus epidermidis NOT DETECTED NOT DETECTED Final   Staphylococcus lugdunensis NOT DETECTED NOT DETECTED Final   Streptococcus species NOT DETECTED NOT DETECTED Final   Streptococcus agalactiae NOT DETECTED NOT DETECTED Final   Streptococcus pneumoniae NOT DETECTED NOT DETECTED Final   Streptococcus pyogenes NOT DETECTED NOT DETECTED Final   A.calcoaceticus-baumannii NOT DETECTED NOT DETECTED Final   Bacteroides fragilis NOT DETECTED NOT DETECTED Final   Enterobacterales NOT DETECTED NOT DETECTED Final   Enterobacter cloacae complex NOT DETECTED NOT DETECTED Final   Escherichia coli NOT DETECTED NOT DETECTED Final   Klebsiella aerogenes NOT DETECTED NOT DETECTED Final   Klebsiella oxytoca NOT DETECTED NOT DETECTED Final   Klebsiella pneumoniae NOT DETECTED NOT DETECTED Final   Proteus species NOT DETECTED NOT DETECTED Final   Salmonella species NOT DETECTED NOT DETECTED Final   Serratia marcescens NOT DETECTED NOT DETECTED Final   Haemophilus influenzae NOT DETECTED NOT DETECTED Final   Neisseria meningitidis NOT DETECTED NOT DETECTED Final   Pseudomonas aeruginosa NOT DETECTED NOT DETECTED Final   Stenotrophomonas maltophilia NOT DETECTED NOT DETECTED Final   Candida albicans NOT DETECTED NOT DETECTED Final   Candida auris NOT DETECTED NOT DETECTED Final   Candida glabrata NOT DETECTED NOT DETECTED Final   Candida krusei NOT DETECTED NOT DETECTED Final   Candida parapsilosis NOT DETECTED NOT DETECTED Final    Candida tropicalis NOT DETECTED NOT DETECTED Final   Cryptococcus neoformans/gattii NOT DETECTED NOT DETECTED Final   Meth resistant mecA/C and MREJ NOT DETECTED NOT DETECTED Final    Comment: Performed at [facility], [address], [place], [place] [address]  Culture, blood (x 2)     Status: Abnormal   Collection Time: 06/15/21  6:01 PM   Specimen: BLOOD  Result Value Ref Range Status   Specimen Description   Final    BLOOD BLOOD LEFT HAND Performed at [facility], [address] [place], [place] [address]    Special Requests   Final    BOTTLES DRAWN AEROBIC ONLY Blood Culture adequate volume Performed at [facility], [address], [place], [place] [address]    Culture  Setup Time   Final    GRAM POSITIVE COCCI IN CLUSTERS AEROBIC BOTTLE ONLY CRITICAL VALUE NOTED.  VALUE IS CONSISTENT WITH PREVIOUSLY REPORTED AND CALLED VALUE.    Culture (A)  Final    STAPHYLOCOCCUS AUREUS SUSCEPTIBILITIES PERFORMED ON PREVIOUS CULTURE WITHIN THE LAST 5 DAYS. Performed at [facility], [address] [place], [place] [address]    Report Status 06/18/2021 FINAL  Final  Culture, blood (routine x 2)  Status: None (Preliminary result)   Collection Time: 06/17/21  4:54 AM   Specimen: BLOOD RIGHT HAND  Result Value Ref Range Status   Specimen Description   Final    BLOOD RIGHT HAND Performed at [facility], [address] [place], [place] [address]    Special Requests   Final    BOTTLES DRAWN AEROBIC ONLY Blood Culture results may not be optimal due to an excessive volume of blood received in culture bottles Performed at [facility], [address] [place], [place] [address]    Culture   Final    NO GROWTH 2 DAYS Performed at [facility], [address] [place], [place] [address]    Report Status PENDING  Incomplete  Culture, blood (routine x 2)     Status: None (Preliminary result)    Collection Time: 06/17/21  4:55 AM   Specimen: BLOOD LEFT ARM  Result Value Ref Range Status   Specimen Description   Final    BLOOD LEFT ARM Performed at [facility], [address] [place], [place] [address]    Special Requests   Final    BOTTLES DRAWN AEROBIC ONLY Blood Culture adequate volume Performed at [facility], [address] [place], [place] [address]    Culture   Final    NO GROWTH 2 DAYS Performed at [facility], [address] [place], [place] [address]    Report Status PENDING  Incomplete      Radiology Studies: ECHOCARDIOGRAM COMPLETE  Result Date: 06/18/2021    ECHOCARDIOGRAM REPORT   Patient Name:   [facility] Date of Exam: 06/18/2021 Medical Rec #:  [id number]          Height:       65.0 in Accession #:    [id number]         Weight:       179.9 lb Date of Birth:  07/05/1961           BSA:          1.891 m Patient Age:    59 years           BP:           132/69 mmHg Patient Gender: M                  HR:           87 bpm. Exam Location:  Inpatient Procedure: 2D Echo Indications:    Bacteremia  History:        Patient has no prior history of Echocardiogram examinations.                 Signs/Symptoms:Altered Mental Status, Dyspnea and Shortness of                 Breath. ETOH. Rhabdomyolysis.  Sonographer:    Sheralyn Boatmanina West RDCS Referring Phys: [phone] CYNTHIA SNIDER  Sonographer Comments: Technically difficult study due to poor echo windows. Very difficult study. Patient was moving and could not follow directions to valsalva or hold breath. IMPRESSIONS  1. 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 Peak gradient ~28 mmHG. There is no asymmetric hypertrophy or evidence of mitral valve SAM. There are no findings to suggest hypertrophic cardiomyopathy. These findings are related to the hyperdynamic LV function and small LV cavity. Left ventricular ejection  fraction, by estimation,  is >75%. The left ventricle has hyperdynamic function. The left ventricle has no regional wall motion abnormalities. There is mild concentric left ventricular hypertrophy. Left ventricular diastolic parameters are consistent with Grade I diastolic dysfunction (impaired relaxation).  2. Right ventricular systolic function is normal. The right ventricular size is normal. Tricuspid regurgitation signal is inadequate for assessing PA pressure.  3. The mitral valve is grossly normal. Mild mitral valve regurgitation. No evidence of mitral stenosis.  4. The aortic valve is grossly normal. There is mild calcification of the aortic valve. Aortic valve regurgitation is mild. Mild to moderate aortic valve sclerosis/calcification is present, without any evidence of aortic stenosis.  5. There is mild dilatation of the ascending aorta, measuring 40 mm.  6. The inferior vena cava is normal in size with greater than 50% respiratory variability, suggesting right atrial pressure of 3 mmHg. Conclusion(s)/Recommendation(s): No evidence of valvular vegetations on this transthoracic echocardiogram. Would recommend a transesophageal echocardiogram to exclude infective endocarditis if clinically indicated. FINDINGS  Left Ventricle: There is an intracavitary gradient due to chordal SAM from a small underfilled LV with hyperdynamic function. Peak gradient ~28 mmHG. There is no asymmetric hypertrophy or evidence of mitral valve SAM. There are no findings to suggest hypertrophic cardiomyopathy. These findings are related to the hyperdynamic LV function and small LV cavity. Left ventricular ejection fraction, by estimation, is >75%. The left ventricle has hyperdynamic function. The left ventricle has no regional wall  motion abnormalities. The left ventricular internal cavity size was small. There is mild concentric left ventricular hypertrophy. Left ventricular diastolic parameters are consistent with Grade I diastolic  dysfunction (impaired relaxation). Right Ventricle: The right ventricular size is normal. No increase in right ventricular wall thickness. Right ventricular systolic function is normal. Tricuspid regurgitation signal is inadequate for assessing PA pressure. Left Atrium: Left atrial size was normal in size. Right Atrium: Right atrial size was normal in size. Pericardium: Trivial pericardial effusion is present. Presence of pericardial fat pad. Mitral Valve: The mitral valve is grossly normal. Mild mitral valve regurgitation. No evidence of mitral valve stenosis. Tricuspid Valve: The tricuspid valve is grossly normal. Tricuspid valve regurgitation is trivial. No evidence of tricuspid stenosis. Aortic Valve: The aortic valve is grossly normal. There is mild calcification of the aortic valve. Aortic valve regurgitation is mild. Aortic regurgitation PHT measures 379 msec. Mild to moderate aortic valve sclerosis/calcification is present, without any evidence of aortic stenosis. Aortic valve mean gradient measures 10.8 mmHg. Aortic valve peak gradient measures 18.3 mmHg. Aortic valve area, by VTI measures 1.87 cm. Pulmonic Valve: The pulmonic valve was grossly normal. Pulmonic valve regurgitation is not visualized. No evidence of pulmonic stenosis. Aorta: The aortic root is normal in size and structure. There is mild dilatation of the ascending aorta, measuring 40 mm. Venous: The inferior vena cava is normal in size with greater than 50% respiratory variability, suggesting right atrial pressure of 3 mmHg. IAS/Shunts: The atrial septum is grossly normal.  LEFT VENTRICLE PLAX 2D LVIDd:         3.70 cm      Diastology LVIDs:         2.40 cm      LV e' medial:    5.98 cm/s LV PW:         1.60 cm      LV E/e' medial:  15.3 LV IVS:        1.40 cm      LV e' lateral:   8.05 cm/s LVOT diam:  1.60 cm      LV E/e' lateral: 11.4 LV SV:         79 LV SV Index:   42 LVOT Area:     2.01 cm  LV Volumes (MOD) LV vol d, MOD A2C: 102.0  ml LV vol d, MOD A4C: 82.2 ml LV vol s, MOD A2C: 40.7 ml LV vol s, MOD A4C: 35.4 ml LV SV MOD A2C:     61.4 ml LV SV MOD A4C:     82.2 ml LV SV MOD BP:      58.6 ml RIGHT VENTRICLE             IVC RV S prime:     12.20 cm/s  IVC diam: 1.40 cm TAPSE (M-mode): 2.1 cm LEFT ATRIUM             Index       RIGHT ATRIUM           Index LA diam:        2.80 cm 1.48 cm/m  RA Area:     11.80 cm LA Vol (A2C):   29.1 ml 15.39 ml/m RA Volume:   23.60 ml  12.48 ml/m LA Vol (A4C):   23.2 ml 12.27 ml/m LA Biplane Vol: 26.5 ml 14.01 ml/m  AORTIC VALVE AV Area (Vmax):    1.97 cm AV Area (Vmean):   1.89 cm AV Area (VTI):     1.87 cm AV Vmax:           214.00 cm/s AV Vmean:          151.750 cm/s AV VTI:            0.423 m AV Peak Grad:      18.3 mmHg AV Mean Grad:      10.8 mmHg LVOT Vmax:         210.00 cm/s LVOT Vmean:        143.000 cm/s LVOT VTI:          0.393 m LVOT/AV VTI ratio: 0.93 AI PHT:            379 msec  AORTA Ao Root diam: 3.60 cm Ao Asc diam:  4.00 cm MITRAL VALVE MV Area (PHT): 4.15 cm     SHUNTS MV Decel Time: 183 msec     Systemic VTI:  0.39 m MV E velocity: 91.70 cm/s   Systemic Diam: 1.60 cm MV A velocity: 110.00 cm/s MV E/A ratio:  0.83 Lennie Odor MD Electronically signed by Lennie Odor MD Signature Date/Time: 06/18/2021/3:25:04 PM    Final      Scheduled Meds:  ceFAZolin (ANCEF) IVPB 2 gram/100 mL NS (Mini-Bag Plus)  2 g Intravenous Q8H   feeding supplement  237 mL Oral BID BM   folic acid  1 mg Oral Daily   magnesium oxide  800 mg Oral BID   multivitamin with minerals  1 tablet Oral Daily   pneumococcal 23 valent vaccine  0.5 mL Intramuscular Tomorrow-1000   potassium chloride  40 mEq Oral TID   simethicone  80 mg Oral QID   thiamine  100 mg Oral Daily   Or   thiamine  100 mg Intravenous Daily   vitamin B-12  1,000 mcg Oral Daily   Continuous Infusions:   LOS: 5 days    Time spent: 20 minutes   Sharlene Dory, DO [facility] 06/20/2021, 12:47 PM    Available via Epic secure chat 7am-7pm After these hours, please  refer to coverage provider listed on [email or web address]

## 2021-06-21 LAB — CBC
HCT: 39 % (ref 39.0–52.0)
Hemoglobin: 13.3 g/dL (ref 13.0–17.0)
MCH: 35.5 pg — ABNORMAL HIGH (ref 26.0–34.0)
MCHC: 34.1 g/dL (ref 30.0–36.0)
MCV: 104 fL — ABNORMAL HIGH (ref 80.0–100.0)
Platelets: 245 10*3/uL (ref 150–400)
RBC: 3.75 MIL/uL — ABNORMAL LOW (ref 4.22–5.81)
RDW: 12 % (ref 11.5–15.5)
WBC: 13.5 10*3/uL — ABNORMAL HIGH (ref 4.0–10.5)
nRBC: 0 % (ref 0.0–0.2)

## 2021-06-21 LAB — COMPREHENSIVE METABOLIC PANEL
ALT: 19 U/L (ref 0–44)
AST: 24 U/L (ref 15–41)
Albumin: 2.3 g/dL — ABNORMAL LOW (ref 3.5–5.0)
Alkaline Phosphatase: 52 U/L (ref 38–126)
Anion gap: 7 (ref 5–15)
BUN: 8 mg/dL (ref 6–20)
CO2: 28 mmol/L (ref 22–32)
Calcium: 8.7 mg/dL — ABNORMAL LOW (ref 8.9–10.3)
Chloride: 94 mmol/L — ABNORMAL LOW (ref 98–111)
Creatinine, Ser: 0.48 mg/dL — ABNORMAL LOW (ref 0.61–1.24)
GFR, Estimated: >60 mL/min (ref >60)
Glucose, Bld: 106 mg/dL — ABNORMAL HIGH (ref 70–99)
Potassium: 3.9 mmol/L (ref 3.5–5.1)
Sodium: 129 mmol/L — ABNORMAL LOW (ref 135–145)
Total Bilirubin: 1.1 mg/dL (ref 0.3–1.2)
Total Protein: 6 g/dL — ABNORMAL LOW (ref 6.5–8.1)

## 2021-06-21 LAB — MAGNESIUM: Magnesium: 1.6 mg/dL — ABNORMAL LOW (ref 1.7–2.4)

## 2021-06-21 MED ORDER — MAGNESIUM SULFATE 2 GM/50ML IV SOLN
2.0000 g | Freq: Once | INTRAVENOUS | Status: AC
  Administered 2021-06-21: 2 g via INTRAVENOUS
  Filled 2021-06-21: qty 50

## 2021-06-21 NOTE — Progress Notes (Signed)
PROGRESS NOTE    DEREN DEGRAZIA  URK:[id number] DOB: 05/04/1961 DOA: 06/15/2021 PCP: Lavinia Sharps, NP    Brief Narrative:  60 year old gentleman who is homeless and continues to drink alcohol presented to the emergency room on 7/12 with generalized weakness.  He was found to have rhabdomyolysis, hyponatremia and ultimately found to have staph aureus bacteremia.  Getting IV antibiotics in the hospital.  No obvious source of infection.   Assessment & Plan:   Principal Problem:   MSSA bacteremia Active Problems:   Hyponatremia   Alcohol abuse  MSSA bacteremia: Blood culture 7/12 MSSA.  Blood cultures 7/14 no growth. TTE with no evidence of endocarditis. TEE requested and is scheduled for tomorrow. Currently remains on Ancef and followed by infectious disease.  Duration will depend on presence of endocarditis or not.  Dilutional hyponatremia: Resolved with isotonic fluid and ultimately with regular diet.  Monitor.  Electrolytes are fairly normal.  Replace potassium and magnesium today.  Hypokalemia/hypomagnesemia: Replaced with oral and IV replacement.  Recheck levels tomorrow morning.  Alcoholism: So far no evidence of alcohol withdrawal.  Remains on symptomatic treatment with thiamine, B12 and multivitamins.   DVT prophylaxis: SCDs Start: 06/15/21 1735   Code Status: Full code Family Communication: None Disposition Plan: Status is: Inpatient  Remains inpatient appropriate because:IV treatments appropriate due to intensity of illness or inability to take PO and Inpatient level of care appropriate due to severity of illness  Dispo: The patient is from:  [facility]               Anticipated d/c is to: SNF              Patient currently is not medically stable to d/c.   Difficult to place patient Yes         Consultants:  Infectious disease  Procedures:  None  Antimicrobials:  Ancef 7/13---   Subjective: Patient seen and examined.  He was complaining of some  left-sided rib pain and some dry cough.  No other overnight events overnight.  He was wondering how long he has to stay in the hospital.  Remains afebrile overnight.  Objective: Vitals:   06/20/21 1636 06/20/21 2044 06/21/21 0524 06/21/21 1314  BP:  118/84 117/74 130/82  Pulse:  92 86 96  Resp:  18 17 16   Temp:  98.7 F (37.1 C) 98.3 F (36.8 C) 98.9 F (37.2 C)  TempSrc:  Oral Oral Oral  SpO2: (!) 89% 95% 94% 97%  Weight:      Height:        Intake/Output Summary (Last 24 hours) at 06/21/2021 1724 Last data filed at 06/21/2021 1521 Gross per 24 hour  Intake 1184 ml  Output 600 ml  Net 584 ml   Filed Weights   06/15/21 1131  Weight: 81.6 kg    Examination:  General exam: Fairly comfortable at rest.  Disheveled and frail looking. On room air.  Not in any distress. Respiratory system: Mostly clear.  Some conducted airway sounds. Cardiovascular system: S1 & S2 heard, RRR. No JVD, murmurs, rubs, gallops or clicks. No pedal edema. Gastrointestinal system: Abdomen is nondistended, soft and nontender. No organomegaly or masses felt. Normal bowel sounds heard. Central nervous system: Alert and oriented. No focal neurological deficits. Extremities: Symmetric 5 x 5 power. Skin: No rashes, lesions or ulcers Psychiatry: Judgement and insight appear normal.  Flat affect.    Data Reviewed: I have personally reviewed following labs and imaging studies  CBC: Recent Labs  Lab 06/15/21 1143 06/15/21 1755 06/16/21 0534 06/17/21 0454 06/18/21 0447 06/19/21 0520 06/20/21 0549 06/21/21 0445  WBC 13.7* 11.9*   < > 12.3* 14.1* 14.1* 13.4* 13.5*  NEUTROABS 11.7* 10.1*  --   --   --  10.2*  --   --   HGB 14.0 14.7   < > 12.8* 12.8* 13.4 12.6* 13.3  HCT 38.2* 41.6   < > 36.2* 35.9* 38.6* 35.8* 39.0  MCV 99.0 103.2*   < > 103.7* 101.7* 101.8* 101.4* 104.0*  PLT 86* 40*   < > 96* 136* 188 212 245   < > = values in this interval not displayed.   Basic Metabolic Panel: Recent Labs   Lab 06/15/21 1802 06/16/21 0534 06/16/21 0811 06/17/21 0612 06/17/21 1433 06/18/21 0447 06/18/21 1430 06/19/21 0520 06/20/21 0549 06/21/21 0445  NA 128* 128* 128* 129* 130* 131*  --  132* 128* 129*  K 3.2* 3.0* 3.3* 2.6* 3.0* 2.9*  --  3.0* 3.0* 3.9  CL 93* 97* 97* 96* 93* 92*  --  89* 92* 94*  CO2 20* 22 24 26 29  32  --  32 26 28  GLUCOSE 91 101* 113* 103* 134* 110*  --  105* 108* 106*  BUN 10 11 10 6 6  <5*  --  6 7 8   CREATININE 0.55* 0.56* 0.48* 0.43* 0.57* 0.33*  --  0.43* 0.48* 0.48*  CALCIUM 8.1* 7.8* 7.8* 7.9* 8.4* 8.4*  --  9.1 8.5* 8.7*  MG  --   --   --  1.7  --   --  1.5* 1.3* 1.3* 1.6*  PHOS 2.6 2.5 2.6  --   --   --   --   --   --   --    GFR: Estimated Creatinine Clearance: 96.5 mL/min (A) (by C-G formula based on SCr of 0.48 mg/dL (L)). Liver Function Tests: Recent Labs  Lab 06/17/21 0612 06/18/21 0447 06/19/21 0520 06/20/21 0549 06/21/21 0445  AST 103* 67* 39 31 24  ALT 55* 42 31 23 19   ALKPHOS 43 44 57 61 52  BILITOT 0.8 0.9 1.1 1.2 1.1  PROT 5.1* 5.1* 5.7* 5.5* 6.0*  ALBUMIN 2.2* 2.2* 2.4* 2.2* 2.3*   No results for input(s): LIPASE, AMYLASE in the last 168 hours. No results for input(s): AMMONIA in the last 168 hours. Coagulation Profile: Recent Labs  Lab 06/15/21 1755 06/16/21 0811  INR 1.0 1.1   Cardiac Enzymes: Recent Labs  Lab 06/15/21 1143 06/18/21 0447  CKTOTAL 5,981* 179   BNP (last 3 results) No results for input(s): PROBNP in the last 8760 hours. HbA1C: No results for input(s): HGBA1C in the last 72 hours. CBG: No results for input(s): GLUCAP in the last 168 hours. Lipid Profile: No results for input(s): CHOL, HDL, LDLCALC, TRIG, CHOLHDL, LDLDIRECT in the last 72 hours. Thyroid Function Tests: No results for input(s): TSH, T4TOTAL, FREET4, T3FREE, THYROIDAB in the last 72 hours. Anemia Panel: No results for input(s): VITAMINB12, FOLATE, FERRITIN, TIBC, IRON, RETICCTPCT in the last 72 hours. Sepsis Labs: Recent Labs   Lab 06/15/21 1755 06/15/21 1918 06/16/21 0811 06/17/21 0612  PROCALCITON  --   --  1.23 0.84  LATICACIDVEN 1.6 1.7  --   --     Recent Results (from the past 240 hour(s))  Resp Panel by RT-PCR (Flu A&B, Covid) Nasopharyngeal Swab     Status: None   Collection Time: 06/15/21  1:47 PM   Specimen: Nasopharyngeal Swab; Nasopharyngeal(NP) swabs in  vial transport medium  Result Value Ref Range Status   SARS Coronavirus 2 by RT PCR NEGATIVE NEGATIVE Final    Comment: (NOTE) SARS-CoV-2 target nucleic acids are NOT DETECTED.  The SARS-CoV-2 RNA is generally detectable in upper respiratory specimens during the acute phase of infection. The lowest concentration of SARS-CoV-2 viral copies this assay can detect is 138 copies/mL. A negative result does not preclude SARS-Cov-2 infection and should not be used as the sole basis for treatment or other patient management decisions. A negative result may occur with  improper specimen collection/handling, submission of specimen other than nasopharyngeal swab, presence of viral mutation(s) within the areas targeted by this assay, and inadequate number of viral copies(<138 copies/mL). A negative result must be combined with clinical observations, patient history, and epidemiological information. The expected result is Negative.  Fact Sheet for Patients:  BloggerCourse.com  Fact Sheet for Healthcare Providers:  SeriousBroker.it  This test is no t yet approved or cleared by the [place] FDA and  has been authorized for detection and/or diagnosis of SARS-CoV-2 by FDA under an Emergency Use Authorization (EUA). This EUA will remain  in effect (meaning this test can be used) for the duration of the COVID-19 declaration under Section 564(b)(1) of the Act, 21 U.S.C.section 360bbb-3(b)(1), unless the authorization is terminated  or revoked sooner.       Influenza A by PCR NEGATIVE NEGATIVE  Final   Influenza B by PCR NEGATIVE NEGATIVE Final    Comment: (NOTE) The Xpert Xpress SARS-CoV-2/FLU/RSV plus assay is intended as an aid in the diagnosis of influenza from Nasopharyngeal swab specimens and should not be used as a sole basis for treatment. Nasal washings and aspirates are unacceptable for Xpert Xpress SARS-CoV-2/FLU/RSV testing.  Fact Sheet for Patients: BloggerCourse.com  Fact Sheet for Healthcare Providers: SeriousBroker.it  This test is not yet approved or cleared by the [place] FDA and has been authorized for detection and/or diagnosis of SARS-CoV-2 by FDA under an Emergency Use Authorization (EUA). This EUA will remain in effect (meaning this test can be used) for the duration of the COVID-19 declaration under Section 564(b)(1) of the Act, 21 U.S.C. section 360bbb-3(b)(1), unless the authorization is terminated or revoked.  Performed at [facility], [address] [place], [place] [address]   Culture, blood (x 2)     Status: Abnormal   Collection Time: 06/15/21  5:56 PM   Specimen: BLOOD  Result Value Ref Range Status   Specimen Description   Final    BLOOD RIGHT ANTECUBITAL Performed at [facility], [address], [place], [place] [address]    Special Requests   Final    BOTTLES DRAWN AEROBIC AND ANAEROBIC Blood Culture adequate volume Performed at [facility], [address] [place], [place] [address]    Culture  Setup Time   Final    GRAM POSITIVE COCCI IN CLUSTERS IN BOTH AEROBIC AND ANAEROBIC BOTTLES CRITICAL RESULT CALLED TO, READ BACK BY AND VERIFIED WITH: Thana Ates [id number] AT 1226 BY CM Performed at [facility], [address], [place], [place] [address]    Culture STAPHYLOCOCCUS AUREUS (A)  Final   Report Status 06/18/2021 FINAL  Final   Organism ID, Bacteria STAPHYLOCOCCUS AUREUS  Final      Susceptibility    Staphylococcus aureus - MIC*    CIPROFLOXACIN >=8 RESISTANT Resistant     ERYTHROMYCIN <=0.25 SENSITIVE Sensitive     GENTAMICIN <=0.5 SENSITIVE Sensitive  OXACILLIN <=0.25 SENSITIVE Sensitive     TETRACYCLINE <=1 SENSITIVE Sensitive     VANCOMYCIN 1 SENSITIVE Sensitive     TRIMETH/SULFA <=10 SENSITIVE Sensitive     CLINDAMYCIN <=0.25 SENSITIVE Sensitive     RIFAMPIN <=0.5 SENSITIVE Sensitive     Inducible Clindamycin NEGATIVE Sensitive     * STAPHYLOCOCCUS AUREUS  Blood Culture ID Panel (Reflexed)     Status: Abnormal   Collection Time: 06/15/21  5:56 PM  Result Value Ref Range Status   Enterococcus faecalis NOT DETECTED NOT DETECTED Final   Enterococcus Faecium NOT DETECTED NOT DETECTED Final   Listeria monocytogenes NOT DETECTED NOT DETECTED Final   Staphylococcus species DETECTED (A) NOT DETECTED Final    Comment: CRITICAL RESULT CALLED TO, READ BACK BY AND VERIFIED WITH: PHARMD J GADHIA [id number] AT 1227 BY CM    Staphylococcus aureus (BCID) DETECTED (A) NOT DETECTED Final    Comment: CRITICAL RESULT CALLED TO, READ BACK BY AND VERIFIED WITH: PHARMD J GADHIA [id number] AT 1226 BY CM    Staphylococcus epidermidis NOT DETECTED NOT DETECTED Final   Staphylococcus lugdunensis NOT DETECTED NOT DETECTED Final   Streptococcus species NOT DETECTED NOT DETECTED Final   Streptococcus agalactiae NOT DETECTED NOT DETECTED Final   Streptococcus pneumoniae NOT DETECTED NOT DETECTED Final   Streptococcus pyogenes NOT DETECTED NOT DETECTED Final   A.calcoaceticus-baumannii NOT DETECTED NOT DETECTED Final   Bacteroides fragilis NOT DETECTED NOT DETECTED Final   Enterobacterales NOT DETECTED NOT DETECTED Final   Enterobacter cloacae complex NOT DETECTED NOT DETECTED Final   Escherichia coli NOT DETECTED NOT DETECTED Final   Klebsiella aerogenes NOT DETECTED NOT DETECTED Final   Klebsiella oxytoca NOT DETECTED NOT DETECTED Final   Klebsiella pneumoniae NOT DETECTED NOT DETECTED Final   Proteus  species NOT DETECTED NOT DETECTED Final   Salmonella species NOT DETECTED NOT DETECTED Final   Serratia marcescens NOT DETECTED NOT DETECTED Final   Haemophilus influenzae NOT DETECTED NOT DETECTED Final   Neisseria meningitidis NOT DETECTED NOT DETECTED Final   Pseudomonas aeruginosa NOT DETECTED NOT DETECTED Final   Stenotrophomonas maltophilia NOT DETECTED NOT DETECTED Final   Candida albicans NOT DETECTED NOT DETECTED Final   Candida auris NOT DETECTED NOT DETECTED Final   Candida glabrata NOT DETECTED NOT DETECTED Final   Candida krusei NOT DETECTED NOT DETECTED Final   Candida parapsilosis NOT DETECTED NOT DETECTED Final   Candida tropicalis NOT DETECTED NOT DETECTED Final   Cryptococcus neoformans/gattii NOT DETECTED NOT DETECTED Final   Meth resistant mecA/C and MREJ NOT DETECTED NOT DETECTED Final    Comment: Performed at [facility], [address] [place], [place] [address]  Culture, blood (x 2)     Status: Abnormal   Collection Time: 06/15/21  6:01 PM   Specimen: BLOOD  Result Value Ref Range Status   Specimen Description   Final    BLOOD BLOOD LEFT HAND Performed at [facility], [address] [place], [place] [address]    Special Requests   Final    BOTTLES DRAWN AEROBIC ONLY Blood Culture adequate volume Performed at [facility], [address] [place], [place] [address]    Culture  Setup Time   Final    GRAM POSITIVE COCCI IN CLUSTERS AEROBIC BOTTLE ONLY CRITICAL VALUE NOTED.  VALUE IS CONSISTENT WITH PREVIOUSLY REPORTED AND CALLED VALUE.    Culture (A)  Final    STAPHYLOCOCCUS AUREUS SUSCEPTIBILITIES PERFORMED ON PREVIOUS CULTURE WITHIN THE LAST  5 DAYS. Performed at [facility], [address] [place], [place] [address]    Report Status 06/18/2021 FINAL  Final  Culture, blood (routine x 2)     Status: None (Preliminary result)   Collection Time: 06/17/21  4:54 AM   Specimen: BLOOD RIGHT HAND  Result  Value Ref Range Status   Specimen Description   Final    BLOOD RIGHT HAND Performed at [facility], [address] [place], [place] [address]    Special Requests   Final    BOTTLES DRAWN AEROBIC ONLY Blood Culture results may not be optimal due to an excessive volume of blood received in culture bottles Performed at [facility], [address] [place], [place] [address]    Culture   Final    NO GROWTH 4 DAYS Performed at [facility], [address] [place], [place] [address]    Report Status PENDING  Incomplete  Culture, blood (routine x 2)     Status: None (Preliminary result)   Collection Time: 06/17/21  4:55 AM   Specimen: BLOOD LEFT ARM  Result Value Ref Range Status   Specimen Description   Final    BLOOD LEFT ARM Performed at [facility], [address] [place], [place] [address]    Special Requests   Final    BOTTLES DRAWN AEROBIC ONLY Blood Culture adequate volume Performed at [facility], [address] [place], [place] [address]    Culture   Final    NO GROWTH 4 DAYS Performed at [facility], [address] [place],  [address]    Report Status PENDING  Incomplete         Radiology Studies: No results found.      Scheduled Meds:  ceFAZolin (ANCEF) IVPB 2 gram/100 mL NS (Mini-Bag Plus)  2 g Intravenous Q8H   feeding supplement  237 mL Oral BID BM   folic acid  1 mg Oral Daily   multivitamin with minerals  1 tablet Oral Daily   pneumococcal 23 valent vaccine  0.5 mL Intramuscular Tomorrow-1000   simethicone  80 mg Oral QID   thiamine  100 mg Oral Daily   Or   thiamine  100 mg Intravenous Daily   vitamin B-12  1,000 mcg Oral Daily   Continuous Infusions:   LOS: 6 days    Time spent: 30 minutes    Faiz Weber, MD [facility] Pager [phone]  

## 2021-06-21 NOTE — [place] FL2 (Signed)
[place] MEDICAID FL2 LEVEL OF CARE SCREENING TOOL     IDENTIFICATION  Patient Name: Joseph Bell Birthdate: 11/07/1961 Sex: male Admission Date (Current Location): 06/15/2021  [facility] and [place] Number:  Producer, television/film/video and Address:  [facility],  [address] [place], [place] [address]      Provider Number: [id number]  Attending Physician Name and Address:  Dorcas Carrow, MD  Relative Name and Phone Number:  friend, Bronwen Betters @ [phone]    Current Level of Care: Hospital Recommended Level of Care: Skilled Nursing Facility Prior Approval Number:    Date Approved/Denied:   PASRR Number: [id number]  Discharge Plan: SNF    Current Diagnoses: Patient Active Problem List   Diagnosis Date Noted   MSSA bacteremia 06/19/2021   Alcohol abuse 06/19/2021   Hyponatremia 06/15/2021    Orientation RESPIRATION BLADDER Height & Weight     Self, Situation, Place  Normal Incontinent Weight: 179 lb 14.3 oz (81.6 kg) Height:  5\' 5"  (165.1 cm)  BEHAVIORAL SYMPTOMS/MOOD NEUROLOGICAL BOWEL NUTRITION STATUS      Continent    AMBULATORY STATUS COMMUNICATION OF NEEDS Skin   Limited Assist Verbally Normal                       Personal Care Assistance Level of Assistance  Bathing, Dressing Bathing Assistance: Limited assistance   Dressing Assistance: Limited assistance     Functional Limitations Info             SPECIAL CARE FACTORS FREQUENCY  PT (By licensed PT), OT (By licensed OT)     PT Frequency: 5x/wk OT Frequency: 5x/wk            Contractures Contractures Info: Not present    Additional Factors Info  Code Status, Allergies Code Status Info: Full Allergies Info: NKDA           Current Medications (06/21/2021):  This is the current hospital active medication list Current Facility-Administered Medications  Medication Dose Route Frequency Provider Last Rate Last Admin   acetaminophen (TYLENOL) tablet 650 mg  650 mg  Oral Q6H PRN 06/23/2021, Tyrone A, DO       Or   acetaminophen (TYLENOL) suppository 650 mg  650 mg Rectal Q6H PRN Ronaldo Miyamoto, Tyrone A, DO       ceFAZolin (ANCEF) 2 g in sodium chloride 0.9 % 100 mL IVPB  2 g Intravenous Q8H Ronaldo Miyamoto, MD   2 g at 06/21/21 0522   feeding supplement (ENSURE ENLIVE / ENSURE PLUS) liquid 237 mL  237 mL Oral BID BM 06/23/21, MD   237 mL at 06/21/21 1054   folic acid (FOLVITE) tablet 1 mg  1 mg Oral Daily Kyle, Tyrone A, DO   1 mg at 06/21/21 1054   HYDROcodone-acetaminophen (NORCO/VICODIN) 5-325 MG per tablet 1-2 tablet  1-2 tablet Oral Q4H PRN 06/23/21 A, DO   2 tablet at 06/21/21 0516   ipratropium-albuterol (DUONEB) 0.5-2.5 (3) MG/3ML nebulizer solution 3 mL  3 mL Nebulization Q4H PRN Pahwani, Rinka R, MD       multivitamin with minerals tablet 1 tablet  1 tablet Oral Daily Kyle, Tyrone A, DO   1 tablet at 06/21/21 1055   ondansetron (ZOFRAN) tablet 4 mg  4 mg Oral Q6H PRN 06/23/21, Tyrone A, DO       Or   ondansetron (ZOFRAN) injection 4 mg  4 mg Intravenous Q6H PRN Ronaldo Miyamoto, Tyrone A, DO  pneumococcal 23 valent vaccine (PNEUMOVAX-23) injection 0.5 mL  0.5 mL Intramuscular Tomorrow-1000 Kyle, Tyrone A, DO       potassium chloride (KLOR-CON) CR tablet 40 mEq  40 mEq Oral TID Sharlene Dory, DO   40 mEq at 06/21/21 1055   simethicone (MYLICON) chewable tablet 80 mg  80 mg Oral QID Rolly Salter, MD   80 mg at 06/21/21 1055   thiamine tablet 100 mg  100 mg Oral Daily Kyle, Tyrone A, DO   100 mg at 06/21/21 1054   Or   thiamine (B-1) injection 100 mg  100 mg Intravenous Daily Kyle, Tyrone A, DO   100 mg at 06/18/21 2878   vitamin B-12 (CYANOCOBALAMIN) tablet 1,000 mcg  1,000 mcg Oral Daily Rolly Salter, MD   1,000 mcg at 06/21/21 1055   zolpidem (AMBIEN) tablet 5 mg  5 mg Oral QHS PRN Rolly Salter, MD   5 mg at 06/18/21 0011     Discharge Medications: Please see discharge summary for a list of discharge medications.  Relevant Imaging  Results:  Relevant Lab Results:   Additional Information anticipate 4-6 weeks of IV abx;  SS# [id number]  Vaden Becherer, LCSW

## 2021-06-21 NOTE — H&P (View-Only) (Signed)
PROGRESS NOTE    Joseph Bell  URK:[id number] DOB: 05/04/1961 DOA: 06/15/2021 PCP: Lavinia Sharps, NP    Brief Narrative:  60 year old gentleman who is homeless and continues to drink alcohol presented to the emergency room on 7/12 with generalized weakness.  He was found to have rhabdomyolysis, hyponatremia and ultimately found to have staph aureus bacteremia.  Getting IV antibiotics in the hospital.  No obvious source of infection.   Assessment & Plan:   Principal Problem:   MSSA bacteremia Active Problems:   Hyponatremia   Alcohol abuse  MSSA bacteremia: Blood culture 7/12 MSSA.  Blood cultures 7/14 no growth. TTE with no evidence of endocarditis. TEE requested and is scheduled for tomorrow. Currently remains on Ancef and followed by infectious disease.  Duration will depend on presence of endocarditis or not.  Dilutional hyponatremia: Resolved with isotonic fluid and ultimately with regular diet.  Monitor.  Electrolytes are fairly normal.  Replace potassium and magnesium today.  Hypokalemia/hypomagnesemia: Replaced with oral and IV replacement.  Recheck levels tomorrow morning.  Alcoholism: So far no evidence of alcohol withdrawal.  Remains on symptomatic treatment with thiamine, B12 and multivitamins.   DVT prophylaxis: SCDs Start: 06/15/21 1735   Code Status: Full code Family Communication: None Disposition Plan: Status is: Inpatient  Remains inpatient appropriate because:IV treatments appropriate due to intensity of illness or inability to take PO and Inpatient level of care appropriate due to severity of illness  Dispo: The patient is from:  [place]               Anticipated d/c is to: SNF              Patient currently is not medically stable to d/c.   Difficult to place patient Yes         Consultants:  Infectious disease  Procedures:  None  Antimicrobials:  Ancef 7/13---   Subjective: Patient seen and examined.  He was complaining of some  left-sided rib pain and some dry cough.  No other overnight events overnight.  He was wondering how long he has to stay in the hospital.  Remains afebrile overnight.  Objective: Vitals:   06/20/21 1636 06/20/21 2044 06/21/21 0524 06/21/21 1314  BP:  118/84 117/74 130/82  Pulse:  92 86 96  Resp:  18 17 16   Temp:  98.7 F (37.1 C) 98.3 F (36.8 C) 98.9 F (37.2 C)  TempSrc:  Oral Oral Oral  SpO2: (!) 89% 95% 94% 97%  Weight:      Height:        Intake/Output Summary (Last 24 hours) at 06/21/2021 1724 Last data filed at 06/21/2021 1521 Gross per 24 hour  Intake 1184 ml  Output 600 ml  Net 584 ml   Filed Weights   06/15/21 1131  Weight: 81.6 kg    Examination:  General exam: Fairly comfortable at rest.  Disheveled and frail looking. On room air.  Not in any distress. Respiratory system: Mostly clear.  Some conducted airway sounds. Cardiovascular system: S1 & S2 heard, RRR. No JVD, murmurs, rubs, gallops or clicks. No pedal edema. Gastrointestinal system: Abdomen is nondistended, soft and nontender. No organomegaly or masses felt. Normal bowel sounds heard. Central nervous system: Alert and oriented. No focal neurological deficits. Extremities: Symmetric 5 x 5 power. Skin: No rashes, lesions or ulcers Psychiatry: Judgement and insight appear normal.  Flat affect.    Data Reviewed: I have personally reviewed following labs and imaging studies  CBC: Recent Labs  Lab 06/15/21 1143 06/15/21 1755 06/16/21 0534 06/17/21 0454 06/18/21 0447 06/19/21 0520 06/20/21 0549 06/21/21 0445  WBC 13.7* 11.9*   < > 12.3* 14.1* 14.1* 13.4* 13.5*  NEUTROABS 11.7* 10.1*  --   --   --  10.2*  --   --   HGB 14.0 14.7   < > 12.8* 12.8* 13.4 12.6* 13.3  HCT 38.2* 41.6   < > 36.2* 35.9* 38.6* 35.8* 39.0  MCV 99.0 103.2*   < > 103.7* 101.7* 101.8* 101.4* 104.0*  PLT 86* 40*   < > 96* 136* 188 212 245   < > = values in this interval not displayed.   Basic Metabolic Panel: Recent Labs   Lab 06/15/21 1802 06/16/21 0534 06/16/21 0811 06/17/21 0612 06/17/21 1433 06/18/21 0447 06/18/21 1430 06/19/21 0520 06/20/21 0549 06/21/21 0445  NA 128* 128* 128* 129* 130* 131*  --  132* 128* 129*  K 3.2* 3.0* 3.3* 2.6* 3.0* 2.9*  --  3.0* 3.0* 3.9  CL 93* 97* 97* 96* 93* 92*  --  89* 92* 94*  CO2 20* 22 24 26 29  32  --  32 26 28  GLUCOSE 91 101* 113* 103* 134* 110*  --  105* 108* 106*  BUN 10 11 10 6 6  <5*  --  6 7 8   CREATININE 0.55* 0.56* 0.48* 0.43* 0.57* 0.33*  --  0.43* 0.48* 0.48*  CALCIUM 8.1* 7.8* 7.8* 7.9* 8.4* 8.4*  --  9.1 8.5* 8.7*  MG  --   --   --  1.7  --   --  1.5* 1.3* 1.3* 1.6*  PHOS 2.6 2.5 2.6  --   --   --   --   --   --   --    GFR: Estimated Creatinine Clearance: 96.5 mL/min (A) (by C-G formula based on SCr of 0.48 mg/dL (L)). Liver Function Tests: Recent Labs  Lab 06/17/21 0612 06/18/21 0447 06/19/21 0520 06/20/21 0549 06/21/21 0445  AST 103* 67* 39 31 24  ALT 55* 42 31 23 19   ALKPHOS 43 44 57 61 52  BILITOT 0.8 0.9 1.1 1.2 1.1  PROT 5.1* 5.1* 5.7* 5.5* 6.0*  ALBUMIN 2.2* 2.2* 2.4* 2.2* 2.3*   No results for input(s): LIPASE, AMYLASE in the last 168 hours. No results for input(s): AMMONIA in the last 168 hours. Coagulation Profile: Recent Labs  Lab 06/15/21 1755 06/16/21 0811  INR 1.0 1.1   Cardiac Enzymes: Recent Labs  Lab 06/15/21 1143 06/18/21 0447  CKTOTAL 5,981* 179   BNP (last 3 results) No results for input(s): PROBNP in the last 8760 hours. HbA1C: No results for input(s): HGBA1C in the last 72 hours. CBG: No results for input(s): GLUCAP in the last 168 hours. Lipid Profile: No results for input(s): CHOL, HDL, LDLCALC, TRIG, CHOLHDL, LDLDIRECT in the last 72 hours. Thyroid Function Tests: No results for input(s): TSH, T4TOTAL, FREET4, T3FREE, THYROIDAB in the last 72 hours. Anemia Panel: No results for input(s): VITAMINB12, FOLATE, FERRITIN, TIBC, IRON, RETICCTPCT in the last 72 hours. Sepsis Labs: Recent Labs   Lab 06/15/21 1755 06/15/21 1918 06/16/21 0811 06/17/21 0612  PROCALCITON  --   --  1.23 0.84  LATICACIDVEN 1.6 1.7  --   --     Recent Results (from the past 240 hour(s))  Resp Panel by RT-PCR (Flu A&B, Covid) Nasopharyngeal Swab     Status: None   Collection Time: 06/15/21  1:47 PM   Specimen: Nasopharyngeal Swab; Nasopharyngeal(NP) swabs in  vial transport medium  Result Value Ref Range Status   SARS Coronavirus 2 by RT PCR NEGATIVE NEGATIVE Final    Comment: (NOTE) SARS-CoV-2 target nucleic acids are NOT DETECTED.  The SARS-CoV-2 RNA is generally detectable in upper respiratory specimens during the acute phase of infection. The lowest concentration of SARS-CoV-2 viral copies this assay can detect is 138 copies/mL. A negative result does not preclude SARS-Cov-2 infection and should not be used as the sole basis for treatment or other patient management decisions. A negative result may occur with  improper specimen collection/handling, submission of specimen other than nasopharyngeal swab, presence of viral mutation(s) within the areas targeted by this assay, and inadequate number of viral copies(<138 copies/mL). A negative result must be combined with clinical observations, patient history, and epidemiological information. The expected result is Negative.  Fact Sheet for Patients:  BloggerCourse.com  Fact Sheet for Healthcare Providers:  SeriousBroker.it  This test is no t yet approved or cleared by the [place] FDA and  has been authorized for detection and/or diagnosis of SARS-CoV-2 by FDA under an Emergency Use Authorization (EUA). This EUA will remain  in effect (meaning this test can be used) for the duration of the COVID-19 declaration under Section 564(b)(1) of the Act, 21 U.S.C.section 360bbb-3(b)(1), unless the authorization is terminated  or revoked sooner.       Influenza A by PCR NEGATIVE NEGATIVE  Final   Influenza B by PCR NEGATIVE NEGATIVE Final    Comment: (NOTE) The Xpert Xpress SARS-CoV-2/FLU/RSV plus assay is intended as an aid in the diagnosis of influenza from Nasopharyngeal swab specimens and should not be used as a sole basis for treatment. Nasal washings and aspirates are unacceptable for Xpert Xpress SARS-CoV-2/FLU/RSV testing.  Fact Sheet for Patients: BloggerCourse.com  Fact Sheet for Healthcare Providers: SeriousBroker.it  This test is not yet approved or cleared by the [place] FDA and has been authorized for detection and/or diagnosis of SARS-CoV-2 by FDA under an Emergency Use Authorization (EUA). This EUA will remain in effect (meaning this test can be used) for the duration of the COVID-19 declaration under Section 564(b)(1) of the Act, 21 U.S.C. section 360bbb-3(b)(1), unless the authorization is terminated or revoked.  Performed at [facility], [address] [place], [place] [address]   Culture, blood (x 2)     Status: Abnormal   Collection Time: 06/15/21  5:56 PM   Specimen: BLOOD  Result Value Ref Range Status   Specimen Description   Final    BLOOD RIGHT ANTECUBITAL Performed at [facility], [address], [place], [place] [address]    Special Requests   Final    BOTTLES DRAWN AEROBIC AND ANAEROBIC Blood Culture adequate volume Performed at [facility], [address] [place], [place] [address]    Culture  Setup Time   Final    GRAM POSITIVE COCCI IN CLUSTERS IN BOTH AEROBIC AND ANAEROBIC BOTTLES CRITICAL RESULT CALLED TO, READ BACK BY AND VERIFIED WITH: Thana Ates [id number] AT 1226 BY CM Performed at [facility], [address], [place], [place] [address]    Culture STAPHYLOCOCCUS AUREUS (A)  Final   Report Status 06/18/2021 FINAL  Final   Organism ID, Bacteria STAPHYLOCOCCUS AUREUS  Final      Susceptibility    Staphylococcus aureus - MIC*    CIPROFLOXACIN >=8 RESISTANT Resistant     ERYTHROMYCIN <=0.25 SENSITIVE Sensitive     GENTAMICIN <=0.5 SENSITIVE Sensitive  OXACILLIN <=0.25 SENSITIVE Sensitive     TETRACYCLINE <=1 SENSITIVE Sensitive     VANCOMYCIN 1 SENSITIVE Sensitive     TRIMETH/SULFA <=10 SENSITIVE Sensitive     CLINDAMYCIN <=0.25 SENSITIVE Sensitive     RIFAMPIN <=0.5 SENSITIVE Sensitive     Inducible Clindamycin NEGATIVE Sensitive     * STAPHYLOCOCCUS AUREUS  Blood Culture ID Panel (Reflexed)     Status: Abnormal   Collection Time: 06/15/21  5:56 PM  Result Value Ref Range Status   Enterococcus faecalis NOT DETECTED NOT DETECTED Final   Enterococcus Faecium NOT DETECTED NOT DETECTED Final   Listeria monocytogenes NOT DETECTED NOT DETECTED Final   Staphylococcus species DETECTED (A) NOT DETECTED Final    Comment: CRITICAL RESULT CALLED TO, READ BACK BY AND VERIFIED WITH: PHARMD J GADHIA [id number] AT 1227 BY CM    Staphylococcus aureus (BCID) DETECTED (A) NOT DETECTED Final    Comment: CRITICAL RESULT CALLED TO, READ BACK BY AND VERIFIED WITH: PHARMD J GADHIA [id number] AT 1226 BY CM    Staphylococcus epidermidis NOT DETECTED NOT DETECTED Final   Staphylococcus lugdunensis NOT DETECTED NOT DETECTED Final   Streptococcus species NOT DETECTED NOT DETECTED Final   Streptococcus agalactiae NOT DETECTED NOT DETECTED Final   Streptococcus pneumoniae NOT DETECTED NOT DETECTED Final   Streptococcus pyogenes NOT DETECTED NOT DETECTED Final   A.calcoaceticus-baumannii NOT DETECTED NOT DETECTED Final   Bacteroides fragilis NOT DETECTED NOT DETECTED Final   Enterobacterales NOT DETECTED NOT DETECTED Final   Enterobacter cloacae complex NOT DETECTED NOT DETECTED Final   Escherichia coli NOT DETECTED NOT DETECTED Final   Klebsiella aerogenes NOT DETECTED NOT DETECTED Final   Klebsiella oxytoca NOT DETECTED NOT DETECTED Final   Klebsiella pneumoniae NOT DETECTED NOT DETECTED Final   Proteus  species NOT DETECTED NOT DETECTED Final   Salmonella species NOT DETECTED NOT DETECTED Final   Serratia marcescens NOT DETECTED NOT DETECTED Final   Haemophilus influenzae NOT DETECTED NOT DETECTED Final   Neisseria meningitidis NOT DETECTED NOT DETECTED Final   Pseudomonas aeruginosa NOT DETECTED NOT DETECTED Final   Stenotrophomonas maltophilia NOT DETECTED NOT DETECTED Final   Candida albicans NOT DETECTED NOT DETECTED Final   Candida auris NOT DETECTED NOT DETECTED Final   Candida glabrata NOT DETECTED NOT DETECTED Final   Candida krusei NOT DETECTED NOT DETECTED Final   Candida parapsilosis NOT DETECTED NOT DETECTED Final   Candida tropicalis NOT DETECTED NOT DETECTED Final   Cryptococcus neoformans/gattii NOT DETECTED NOT DETECTED Final   Meth resistant mecA/C and MREJ NOT DETECTED NOT DETECTED Final    Comment: Performed at [facility], [address] [place], [place] [address]  Culture, blood (x 2)     Status: Abnormal   Collection Time: 06/15/21  6:01 PM   Specimen: BLOOD  Result Value Ref Range Status   Specimen Description   Final    BLOOD BLOOD LEFT HAND Performed at [facility], [address] [place], [place] [address]    Special Requests   Final    BOTTLES DRAWN AEROBIC ONLY Blood Culture adequate volume Performed at [facility], [address] [place], [place] [address]    Culture  Setup Time   Final    GRAM POSITIVE COCCI IN CLUSTERS AEROBIC BOTTLE ONLY CRITICAL VALUE NOTED.  VALUE IS CONSISTENT WITH PREVIOUSLY REPORTED AND CALLED VALUE.    Culture (A)  Final    STAPHYLOCOCCUS AUREUS SUSCEPTIBILITIES PERFORMED ON PREVIOUS CULTURE WITHIN THE LAST  5 DAYS. Performed at [facility], [address], [place], [place] [address]    Report Status 06/18/2021 FINAL  Final  Culture, blood (routine x 2)     Status: None (Preliminary result)   Collection Time: 06/17/21  4:54 AM   Specimen: BLOOD RIGHT HAND  Result  Value Ref Range Status   Specimen Description   Final    BLOOD RIGHT HAND Performed at [facility], [address] [place], [place] [address]    Special Requests   Final    BOTTLES DRAWN AEROBIC ONLY Blood Culture results may not be optimal due to an excessive volume of blood received in culture bottles Performed at [facility], [address], [place], [place] [address]    Culture   Final    NO GROWTH 4 DAYS Performed at [facility], [address], [place], [place] [address]    Report Status PENDING  Incomplete  Culture, blood (routine x 2)     Status: None (Preliminary result)   Collection Time: 06/17/21  4:55 AM   Specimen: BLOOD LEFT ARM  Result Value Ref Range Status   Specimen Description   Final    BLOOD LEFT ARM Performed at [facility], [address] [place], [place] [address]    Special Requests   Final    BOTTLES DRAWN AEROBIC ONLY Blood Culture adequate volume Performed at [facility], [address] [place], [place] [address]    Culture   Final    NO GROWTH 4 DAYS Performed at [facility], [address] [place], [place] [address]    Report Status PENDING  Incomplete         Radiology Studies: No results found.      Scheduled Meds:  ceFAZolin (ANCEF) IVPB 2 gram/100 mL NS (Mini-Bag Plus)  2 g Intravenous Q8H   feeding supplement  237 mL Oral BID BM   folic acid  1 mg Oral Daily   multivitamin with minerals  1 tablet Oral Daily   pneumococcal 23 valent vaccine  0.5 mL Intramuscular Tomorrow-1000   simethicone  80 mg Oral QID   thiamine  100 mg Oral Daily   Or   thiamine  100 mg Intravenous Daily   vitamin B-12  1,000 mcg Oral Daily   Continuous Infusions:   LOS: 6 days    Time spent: 30 minutes    Dorcas Carrow, MD [facility] Pager [phone]

## 2021-06-21 NOTE — TOC Progression Note (Signed)
Transition of Care [facility]) - Progression Note    Patient Details  Name: Joseph Bell MRN: [id number] Date of Birth: 08/10/1961  Transition of [facility]) CM/SW Contact  Lennart Pall, LCSW Phone Number: 06/21/2021, 3:31 PM  Clinical Narrative:    Met with pt today to discuss therapy recs for SNF - he is agreeable.  Per MD, should also anticipate 4-6 weeks of IV abx.   With his agreement, I contacted a friend of his, Joseph Bell [phone]), who reports that several people "in the neighborhood" as well as local pastor have made several attempts to assist patient over the years "but he just drinks and then gets disrespectful to the ones trying to help him...".  She does state a commitment to continue to help him with food and is trying to reach out to neighbors to see if she can find contact information for pt's uncle who, she thinks, lives in the area.   Have reached out to the [facility] financial counseling dept to determine if they plan to start a Medicaid application for pt.  Will continue to follow.   Expected Discharge Plan: Home/Self Care Barriers to Discharge: No Barriers Identified  Expected Discharge Plan and Services Expected Discharge Plan: Home/Self Care In-house Referral: Clinical Social Work     Living arrangements for the past 2 months: No permanent address                                       Social Determinants of Health (SDOH) Interventions    Readmission Risk Interventions No flowsheet data found.

## 2021-06-21 NOTE — Progress Notes (Signed)
Physical Therapy Treatment Patient Details Name: Joseph Bell MRN: [id number] DOB: 01/25/1961 Today's Date: 06/21/2021    History of Present Illness patient is a 60 yo male admitted with hyponatremia, AMS, weakness, falls, sepsis, rhabdomyolysis. RR called on 7/12. Hx of chronic L rib fxs, ETOH abuse    PT Comments    Pt ambulated in hallway and able to tolerate improved distance.  Pt continues to require occasional steadying assist and cues for safety.    Follow Up Recommendations  SNF     Equipment Recommendations  Rolling walker with 5" wheels    Recommendations for Other Services       Precautions / Restrictions Precautions Precautions: Fall    Mobility  Bed Mobility Overal bed mobility: Needs Assistance Bed Mobility: Supine to Sit;Sit to Supine     Supine to sit: Supervision;HOB elevated Sit to supine: Supervision;HOB elevated   General bed mobility comments: increased time and effort    Transfers Overall transfer level: Needs assistance Equipment used: Rolling walker (2 wheeled) Transfers: Sit to/from Stand Sit to Stand: Min assist         General transfer comment: assist to rise and steady, cues for hand placement  Ambulation/Gait Ambulation/Gait assistance: Min assist Gait Distance (Feet): 50 Feet (x2) Assistive device: Rolling walker (2 wheeled) Gait Pattern/deviations: Step-through pattern;Decreased stride length Gait velocity: decr   General Gait Details: verbal cues for posture and RW position, occasional steadying assist   Stairs             Wheelchair Mobility    Modified Rankin (Stroke Patients Only)       Balance Overall balance assessment: History of Falls         Standing balance support: Bilateral upper extremity supported Standing balance-Leahy Scale: Poor Standing balance comment: use of external support                            Cognition Arousal/Alertness: Awake/alert Behavior During  Therapy: WFL for tasks assessed/performed Overall Cognitive Status: No family/caregiver present to determine baseline cognitive functioning Area of Impairment: Safety/judgement;Problem solving                         Safety/Judgement: Decreased awareness of safety   Problem Solving: Requires verbal cues;Difficulty sequencing General Comments: requiring cues for safety, pt does point to his fall risk band after stating he does feel unsteady with walking      Exercises      General Comments        Pertinent Vitals/Pain Pain Assessment: No/denies pain Pain Intervention(s): Monitored during session;Repositioned    Home Living                      Prior Function            PT Goals (current goals can now be found in the care plan section) Progress towards PT goals: Progressing toward goals    Frequency    Min 3X/week      PT Plan Current plan remains appropriate    Co-evaluation              AM-PAC PT "6 Clicks" Mobility   Outcome Measure  Help needed turning from your back to your side while in a flat bed without using bedrails?: A Little Help needed moving from lying on your back to sitting on the side of a flat bed without using bedrails?:  A Little Help needed moving to and from a bed to a chair (including a wheelchair)?: A Little Help needed standing up from a chair using your arms (e.g., wheelchair or bedside chair)?: A Little Help needed to walk in hospital room?: A Little Help needed climbing 3-5 steps with a railing? : A Lot 6 Click Score: 17    End of Session Equipment Utilized During Treatment: Gait belt Activity Tolerance: Patient tolerated treatment well Patient left: in bed;with call bell/phone within reach;with bed alarm set Nurse Communication: Mobility status PT Visit Diagnosis: Muscle weakness (generalized) (M62.81);Other abnormalities of gait and mobility (R26.89);History of falling (Z91.81)     Time: 6712-4580 PT  Time Calculation (min) (ACUTE ONLY): 24 min  Charges:  $Gait Training: 8-22 mins                     Paulino Door, DPT Acute Rehabilitation Services Pager: [phone] Office: [phone]     Maida Sale E 06/21/2021, 2:07 PM

## 2021-06-21 NOTE — Progress Notes (Signed)
   [facility] has been requested to perform a transesophageal echocardiogram on Joseph Bell for bacteremia After careful review of history and examination, the risks and benefits of transesophageal echocardiogram have been explained including risks of esophageal damage, perforation (1:10,000 risk), bleeding, pharyngeal hematoma as well as other potential complications associated with conscious sedation including aspiration, arrhythmia, respiratory failure and death. Alternatives to treatment were discussed, questions were answered. Patient is willing to proceed.   Procedure scheduled for 06/22/2021 at 2pm with Dr. Jens Som. Will place orders.  Corrin Parker, PA-C 06/21/2021 11:30 AM

## 2021-06-21 NOTE — Progress Notes (Signed)
[facility]   Reason for visit: Follow up on bacteremia  Interval History: TTE negative for vegetation, TEE scheduled for tomorrow; WBC stable at 13.5, remains afebrile since his initial fever on admission.   Day 6 cefazolin Day 7 total antibiotics  Physical Exam: Constitutional:  Vitals:   06/21/21 0524 06/21/21 1314  BP: 117/74 130/82  Pulse: 86 96  Resp: 17 16  Temp: 98.3 F (36.8 C) 98.9 F (37.2 C)  SpO2: 94% 97%   patient appears in NAD Eyes: anicteric HENT: no thrush Respiratory: Normal respiratory effort; CTA B Cardiovascular: RRR GI: soft, nt, nd  Review of Systems: Constitutional: negative for fevers and chills Gastrointestinal: negative for nausea and diarrhea Integument/breast: negative for rash  Lab Results  Component Value Date   WBC 13.5 (H) 06/21/2021   HGB 13.3 06/21/2021   HCT 39.0 06/21/2021   MCV 104.0 (H) 06/21/2021   PLT 245 06/21/2021    Lab Results  Component Value Date   CREATININE 0.48 (L) 06/21/2021   BUN 8 06/21/2021   NA 129 (L) 06/21/2021   K 3.9 06/21/2021   CL 94 (L) 06/21/2021   CO2 28 06/21/2021    Lab Results  Component Value Date   ALT 19 06/21/2021   AST 24 06/21/2021   ALKPHOS 52 06/21/2021     Microbiology: Recent Results (from the past 240 hour(s))  Resp Panel by RT-PCR (Flu A&B, Covid) Nasopharyngeal Swab     Status: None   Collection Time: 06/15/21  1:47 PM   Specimen: Nasopharyngeal Swab; Nasopharyngeal(NP) swabs in vial transport medium  Result Value Ref Range Status   SARS Coronavirus 2 by RT PCR NEGATIVE NEGATIVE Final    Comment: (NOTE) SARS-CoV-2 target nucleic acids are NOT DETECTED.  The SARS-CoV-2 RNA is generally detectable in upper respiratory specimens during the acute phase of infection. The lowest concentration of SARS-CoV-2 viral copies this assay can detect is 138 copies/mL. A negative result does not preclude SARS-Cov-2 infection and should not be used as the  sole basis for treatment or other patient management decisions. A negative result may occur with  improper specimen collection/handling, submission of specimen other than nasopharyngeal swab, presence of viral mutation(s) within the areas targeted by this assay, and inadequate number of viral copies(<138 copies/mL). A negative result must be combined with clinical observations, patient history, and epidemiological information. The expected result is Negative.  Fact Sheet for Patients:  BloggerCourse.com  Fact Sheet for Healthcare Providers:  SeriousBroker.it  This test is no t yet approved or cleared by the [place] FDA and  has been authorized for detection and/or diagnosis of SARS-CoV-2 by FDA under an Emergency Use Authorization (EUA). This EUA will remain  in effect (meaning this test can be used) for the duration of the COVID-19 declaration under Section 564(b)(1) of the Act, 21 U.S.C.section 360bbb-3(b)(1), unless the authorization is terminated  or revoked sooner.       Influenza A by PCR NEGATIVE NEGATIVE Final   Influenza B by PCR NEGATIVE NEGATIVE Final    Comment: (NOTE) The Xpert Xpress SARS-CoV-2/FLU/RSV plus assay is intended as an aid in the diagnosis of influenza from Nasopharyngeal swab specimens and should not be used as a sole basis for treatment. Nasal washings and aspirates are unacceptable for Xpert Xpress SARS-CoV-2/FLU/RSV testing.  Fact Sheet for Patients: BloggerCourse.com  Fact Sheet for Healthcare Providers: SeriousBroker.it  This test is not yet approved or cleared by the [place] FDA and has  been authorized for detection and/or diagnosis of SARS-CoV-2 by FDA under an Emergency Use Authorization (EUA). This EUA will remain in effect (meaning this test can be used) for the duration of the COVID-19 declaration under Section 564(b)(1) of the  Act, 21 U.S.C. section 360bbb-3(b)(1), unless the authorization is terminated or revoked.  Performed at [facility], [address] [place], [place] [address]   Culture, blood (x 2)     Status: Abnormal   Collection Time: 06/15/21  5:56 PM   Specimen: BLOOD  Result Value Ref Range Status   Specimen Description   Final    BLOOD RIGHT ANTECUBITAL Performed at [facility], [address] [place], [place] [address]    Special Requests   Final    BOTTLES DRAWN AEROBIC AND ANAEROBIC Blood Culture adequate volume Performed at [facility], [address] [place], [place] [address]    Culture  Setup Time   Final    GRAM POSITIVE COCCI IN CLUSTERS IN BOTH AEROBIC AND ANAEROBIC BOTTLES CRITICAL RESULT CALLED TO, READ BACK BY AND VERIFIED WITH: Thana AtesHARMD J GADHIA [id number] AT 1226 BY CM Performed at [facility], [address] [place], [place] [address]    Culture STAPHYLOCOCCUS AUREUS (A)  Final   Report Status 06/18/2021 FINAL  Final   Organism ID, Bacteria STAPHYLOCOCCUS AUREUS  Final      Susceptibility   Staphylococcus aureus - MIC*    CIPROFLOXACIN >=8 RESISTANT Resistant     ERYTHROMYCIN <=0.25 SENSITIVE Sensitive     GENTAMICIN <=0.5 SENSITIVE Sensitive     OXACILLIN <=0.25 SENSITIVE Sensitive     TETRACYCLINE <=1 SENSITIVE Sensitive     VANCOMYCIN 1 SENSITIVE Sensitive     TRIMETH/SULFA <=10 SENSITIVE Sensitive     CLINDAMYCIN <=0.25 SENSITIVE Sensitive     RIFAMPIN <=0.5 SENSITIVE Sensitive     Inducible Clindamycin NEGATIVE Sensitive     * STAPHYLOCOCCUS AUREUS  Blood Culture ID Panel (Reflexed)     Status: Abnormal   Collection Time: 06/15/21  5:56 PM  Result Value Ref Range Status   Enterococcus faecalis NOT DETECTED NOT DETECTED Final   Enterococcus Faecium NOT DETECTED NOT DETECTED Final   Listeria monocytogenes NOT DETECTED NOT DETECTED Final   Staphylococcus species DETECTED (A) NOT DETECTED Final     Comment: CRITICAL RESULT CALLED TO, READ BACK BY AND VERIFIED WITH: PHARMD J GADHIA [id number] AT 1227 BY CM    Staphylococcus aureus (BCID) DETECTED (A) NOT DETECTED Final    Comment: CRITICAL RESULT CALLED TO, READ BACK BY AND VERIFIED WITH: PHARMD J GADHIA [id number] AT 1226 BY CM    Staphylococcus epidermidis NOT DETECTED NOT DETECTED Final   Staphylococcus lugdunensis NOT DETECTED NOT DETECTED Final   Streptococcus species NOT DETECTED NOT DETECTED Final   Streptococcus agalactiae NOT DETECTED NOT DETECTED Final   Streptococcus pneumoniae NOT DETECTED NOT DETECTED Final   Streptococcus pyogenes NOT DETECTED NOT DETECTED Final   A.calcoaceticus-baumannii NOT DETECTED NOT DETECTED Final   Bacteroides fragilis NOT DETECTED NOT DETECTED Final   Enterobacterales NOT DETECTED NOT DETECTED Final   Enterobacter cloacae complex NOT DETECTED NOT DETECTED Final   Escherichia coli NOT DETECTED NOT DETECTED Final   Klebsiella aerogenes NOT DETECTED NOT DETECTED Final   Klebsiella oxytoca NOT DETECTED NOT DETECTED Final   Klebsiella pneumoniae NOT DETECTED NOT DETECTED Final   Proteus species NOT DETECTED NOT DETECTED Final   Salmonella species NOT DETECTED NOT DETECTED Final   Serratia  marcescens NOT DETECTED NOT DETECTED Final   Haemophilus influenzae NOT DETECTED NOT DETECTED Final   Neisseria meningitidis NOT DETECTED NOT DETECTED Final   Pseudomonas aeruginosa NOT DETECTED NOT DETECTED Final   Stenotrophomonas maltophilia NOT DETECTED NOT DETECTED Final   Candida albicans NOT DETECTED NOT DETECTED Final   Candida auris NOT DETECTED NOT DETECTED Final   Candida glabrata NOT DETECTED NOT DETECTED Final   Candida krusei NOT DETECTED NOT DETECTED Final   Candida parapsilosis NOT DETECTED NOT DETECTED Final   Candida tropicalis NOT DETECTED NOT DETECTED Final   Cryptococcus neoformans/gattii NOT DETECTED NOT DETECTED Final   Meth resistant mecA/C and MREJ NOT DETECTED NOT DETECTED Final     Comment: Performed at [facility], [address] [place], [place] [address]  Culture, blood (x 2)     Status: Abnormal   Collection Time: 06/15/21  6:01 PM   Specimen: BLOOD  Result Value Ref Range Status   Specimen Description   Final    BLOOD BLOOD LEFT HAND Performed at [facility], [address] [place], [place] [address]    Special Requests   Final    BOTTLES DRAWN AEROBIC ONLY Blood Culture adequate volume Performed at [facility], [address], [place], [place] [address]    Culture  Setup Time   Final    GRAM POSITIVE COCCI IN CLUSTERS AEROBIC BOTTLE ONLY CRITICAL VALUE NOTED.  VALUE IS CONSISTENT WITH PREVIOUSLY REPORTED AND CALLED VALUE.    Culture (A)  Final    STAPHYLOCOCCUS AUREUS SUSCEPTIBILITIES PERFORMED ON PREVIOUS CULTURE WITHIN THE LAST 5 DAYS. Performed at [facility], [address] [place], [place] [address]    Report Status 06/18/2021 FINAL  Final  Culture, blood (routine x 2)     Status: None (Preliminary result)   Collection Time: 06/17/21  4:54 AM   Specimen: BLOOD RIGHT HAND  Result Value Ref Range Status   Specimen Description   Final    BLOOD RIGHT HAND Performed at [facility], [address] [place], [place] [address]    Special Requests   Final    BOTTLES DRAWN AEROBIC ONLY Blood Culture results may not be optimal due to an excessive volume of blood received in culture bottles Performed at [facility], [address], [place], [place] [address]    Culture   Final    NO GROWTH 4 DAYS Performed at [facility], [address], [place], [place] [address]    Report Status PENDING  Incomplete  Culture, blood (routine x 2)     Status: None (Preliminary result)   Collection Time: 06/17/21  4:55 AM   Specimen: BLOOD LEFT ARM  Result Value Ref Range Status   Specimen Description   Final    BLOOD LEFT ARM Performed at [facility], [address], [place], [place] [address]    Special Requests   Final    BOTTLES DRAWN AEROBIC ONLY Blood Culture adequate volume Performed at [facility], [address] [place], [place] [address]    Culture   Final    NO GROWTH 4 DAYS Performed at [facility], [address], [place], [place] [address]    Report Status PENDING  Incomplete    Impression/Plan:  1. Bacteremia - MSSA and repeat blood cultures remain ngtd from 7/14.  TTE without evidence of valvular vegetation.  TEE tomorrow.  Continue cefazolin  Duration will depend on TEE findings No other source identified, no back pain or other concerns.   2. Rib pain - fall and CXR c/w chronic rib fracture at site of his rib pain.

## 2021-06-22 ENCOUNTER — Inpatient Hospital Stay (HOSPITAL_COMMUNITY): Payer: Self-pay | Admitting: Certified Registered Nurse Anesthetist

## 2021-06-22 ENCOUNTER — Inpatient Hospital Stay (HOSPITAL_COMMUNITY): Payer: Self-pay

## 2021-06-22 ENCOUNTER — Encounter (HOSPITAL_COMMUNITY): Payer: Self-pay | Admitting: Internal Medicine

## 2021-06-22 ENCOUNTER — Encounter (HOSPITAL_COMMUNITY)
Admission: EM | Disposition: A | Discharge status: Home / Self Care | Payer: Self-pay | Source: Home / Self Care | Attending: Internal Medicine

## 2021-06-22 DIAGNOSIS — R7881 Bacteremia: Secondary | ICD-10-CM

## 2021-06-22 DIAGNOSIS — I340 Nonrheumatic mitral (valve) insufficiency: Secondary | ICD-10-CM

## 2021-06-22 HISTORY — PX: TEE WITHOUT CARDIOVERSION: SHX5443

## 2021-06-22 LAB — CBC
HCT: 36.7 % — ABNORMAL LOW (ref 39.0–52.0)
Hemoglobin: 12.5 g/dL — ABNORMAL LOW (ref 13.0–17.0)
MCH: 35.2 pg — ABNORMAL HIGH (ref 26.0–34.0)
MCHC: 34.1 g/dL (ref 30.0–36.0)
MCV: 103.4 fL — ABNORMAL HIGH (ref 80.0–100.0)
Platelets: 262 10*3/uL (ref 150–400)
RBC: 3.55 MIL/uL — ABNORMAL LOW (ref 4.22–5.81)
RDW: 11.9 % (ref 11.5–15.5)
WBC: 15.6 10*3/uL — ABNORMAL HIGH (ref 4.0–10.5)
nRBC: 0 % (ref 0.0–0.2)

## 2021-06-22 LAB — CULTURE, BLOOD (ROUTINE X 2)
Culture: NO GROWTH
Culture: NO GROWTH
Special Requests: ADEQUATE

## 2021-06-22 LAB — BASIC METABOLIC PANEL
Anion gap: 12 (ref 5–15)
BUN: 6 mg/dL (ref 6–20)
CO2: 23 mmol/L (ref 22–32)
Calcium: 8.3 mg/dL — ABNORMAL LOW (ref 8.9–10.3)
Chloride: 92 mmol/L — ABNORMAL LOW (ref 98–111)
Creatinine, Ser: 0.39 mg/dL — ABNORMAL LOW (ref 0.61–1.24)
GFR, Estimated: >60 mL/min (ref >60)
Glucose, Bld: 100 mg/dL — ABNORMAL HIGH (ref 70–99)
Potassium: 3.7 mmol/L (ref 3.5–5.1)
Sodium: 127 mmol/L — ABNORMAL LOW (ref 135–145)

## 2021-06-22 LAB — MAGNESIUM: Magnesium: 1.6 mg/dL — ABNORMAL LOW (ref 1.7–2.4)

## 2021-06-22 SURGERY — ECHOCARDIOGRAM, TRANSESOPHAGEAL
Anesthesia: Monitor Anesthesia Care

## 2021-06-22 MED ORDER — SODIUM CHLORIDE 0.9 % IV SOLN
INTRAVENOUS | Status: DC | PRN
Start: 2021-06-22 — End: 2021-06-22

## 2021-06-22 MED ORDER — ORITAVANCIN DIPHOSPHATE 400 MG IV SOLR
1200.0000 mg | Freq: Once | INTRAVENOUS | Status: AC
  Administered 2021-06-23: 1200 mg via INTRAVENOUS
  Filled 2021-06-22: qty 120

## 2021-06-22 MED ORDER — PHENYLEPHRINE 40 MCG/ML (10ML) SYRINGE FOR IV PUSH (FOR BLOOD PRESSURE SUPPORT)
PREFILLED_SYRINGE | INTRAVENOUS | Status: DC | PRN
  Administered 2021-06-22: 80 ug via INTRAVENOUS
  Administered 2021-06-22: 120 ug via INTRAVENOUS
  Administered 2021-06-22: 80 ug via INTRAVENOUS
  Administered 2021-06-22: 120 ug via INTRAVENOUS

## 2021-06-22 MED ORDER — MAGNESIUM SULFATE 2 GM/50ML IV SOLN
2.0000 g | Freq: Once | INTRAVENOUS | Status: AC
  Administered 2021-06-22: 2 g via INTRAVENOUS
  Filled 2021-06-22: qty 50

## 2021-06-22 MED ORDER — PROPOFOL 10 MG/ML IV BOLUS
INTRAVENOUS | Status: DC | PRN
  Administered 2021-06-22: 20 mg via INTRAVENOUS
  Administered 2021-06-22: 40 mg via INTRAVENOUS
  Administered 2021-06-22: 30 mg via INTRAVENOUS

## 2021-06-22 MED ORDER — PROPOFOL 500 MG/50ML IV EMUL
INTRAVENOUS | Status: DC | PRN
  Administered 2021-06-22: 100 ug/kg/min via INTRAVENOUS

## 2021-06-22 MED ORDER — LIDOCAINE 2% (20 MG/ML) 5 ML SYRINGE
INTRAMUSCULAR | Status: DC | PRN
  Administered 2021-06-22: 20 mg via INTRAVENOUS
  Administered 2021-06-22: 40 mg via INTRAVENOUS

## 2021-06-22 NOTE — Anesthesia Procedure Notes (Signed)
Procedure Name: MAC Date/Time: 06/22/2021 11:30 AM Performed by: Fulton Reek, CRNA Pre-anesthesia Checklist: Patient identified, Suction available, Emergency Drugs available, Patient being monitored and Timeout performed Patient Re-evaluated:Patient Re-evaluated prior to induction Oxygen Delivery Method: Nasal cannula Preoxygenation: Pre-oxygenation with 100% oxygen Induction Type: IV induction Dental Injury: Teeth and Oropharynx as per pre-operative assessment

## 2021-06-22 NOTE — Progress Notes (Signed)
    Transesophageal Echocardiogram Note  Joseph Bell [id number] [birth year]  Procedure: Transesophageal Echocardiogram Indications: Bacteremia   Procedure Details Consent: Obtained Time Out: Verified patient identification, verified procedure, site/side was marked, verified correct patient position, special equipment/implants available, Radiology Safety Procedures followed,  medications/allergies/relevent history reviewed, required imaging and test results available.  Performed  Medications: Pt sedated by anesthesia with lidocaine 60 mg and diprovan 130 mg IV.  Normal LV function; mild AI, MR, TR; no vegetations.     Complications: No apparent complications Patient did tolerate procedure well.  [name], MD

## 2021-06-22 NOTE — Progress Notes (Signed)
PROGRESS NOTE    Joseph Bell  NUU:[id number] DOB: 07-06-1961 DOA: 06/15/2021 PCP: Lavinia Sharps, NP    Brief Narrative:  60 year old gentleman who is homeless and continues to drink alcohol presented to the emergency room on 7/12 with generalized weakness.  He was found to have rhabdomyolysis, hyponatremia and ultimately found to have staph aureus bacteremia.  Getting IV antibiotics in the hospital.  No obvious source of infection.   Assessment & Plan:   Principal Problem:   MSSA bacteremia Active Problems:   Hyponatremia   Alcohol abuse  MSSA bacteremia: Blood culture 7/12 MSSA.  Blood cultures 7/14 no growth. TTE with no evidence of endocarditis. TEE with no evidence of vegetations. Currently remains on Ancef and followed by infectious disease.  Anticipate 4 weeks of IV antibiotics. Patient is homeless and unable to safely use IV antibiotics outpatient.  May need placement.  Dilutional hyponatremia: Resolved with isotonic fluid and ultimately with regular diet.  Monitor.  Electrolytes are fairly normal.  Replace potassium and magnesium normalized.  Hypokalemia/hypomagnesemia: Replaced with oral and IV replacement.  Further replace magnesium today.  Alcoholism: So far no evidence of alcohol withdrawal.  Remains on symptomatic treatment with thiamine, B12 and multivitamins.   DVT prophylaxis: SCDs Start: 06/15/21 1735   Code Status: Full code Family Communication: None Disposition Plan: Status is: Inpatient  Remains inpatient appropriate because:IV treatments appropriate due to intensity of illness or inability to take PO and Inpatient level of care appropriate due to severity of illness  Dispo: The patient is from:  [place]               Anticipated d/c is to: SNF              Patient currently is not medically stable to d/c.   Difficult to place patient Yes         Consultants:  Infectious disease  Procedures:  None  Antimicrobials:  Ancef  7/13---   Subjective: Patient seen and examined in the morning rounds.  He was very anxious about unable to get anything to eat or drink.  He was n.p.o.  Had some chest pain, however no other problems.  He was not quite able to process through how he got bloodstream infection.  Objective: Vitals:   06/22/21 1201 06/22/21 1211 06/22/21 1221 06/22/21 1313  BP: (!) 84/58 (!) 103/59 106/75 106/77  Pulse: 87 89 82 83  Resp: (!) 23 (!) 22 (!) 28 19  Temp: 97.7 F (36.5 C)   97.7 F (36.5 C)  TempSrc: Oral   Oral  SpO2: 92% 96% 94% 96%  Weight:      Height:        Intake/Output Summary (Last 24 hours) at 06/22/2021 1409 Last data filed at 06/22/2021 1151 Gross per 24 hour  Intake 991 ml  Output 1800 ml  Net -809 ml   Filed Weights   06/15/21 1131  Weight: 81.6 kg    Examination:  General exam: Fairly comfortable at rest.  Disheveled and frail looking. On room air.  Not in any distress. Respiratory system: Mostly clear.  Some conducted airway sounds. Cardiovascular system: S1 & S2 heard, RRR. No JVD, murmurs, rubs, gallops or clicks. No pedal edema. Gastrointestinal system: Abdomen is nondistended, soft and nontender. No organomegaly or masses felt. Normal bowel sounds heard. Central nervous system: Alert and oriented. No focal neurological deficits. Extremities: Symmetric 5 x 5 power. Skin: No rashes, lesions or ulcers Psychiatry: Judgement and insight appear normal.  Flat affect.    Data Reviewed: I have personally reviewed following labs and imaging studies  CBC: Recent Labs  Lab 06/15/21 1755 06/16/21 0534 06/18/21 0447 06/19/21 0520 06/20/21 0549 06/21/21 0445 06/22/21 0455  WBC 11.9*   < > 14.1* 14.1* 13.4* 13.5* 15.6*  NEUTROABS 10.1*  --   --  10.2*  --   --   --   HGB 14.7   < > 12.8* 13.4 12.6* 13.3 12.5*  HCT 41.6   < > 35.9* 38.6* 35.8* 39.0 36.7*  MCV 103.2*   < > 101.7* 101.8* 101.4* 104.0* 103.4*  PLT 40*   < > 136* 188 212 245 262   < > = values  in this interval not displayed.   Basic Metabolic Panel: Recent Labs  Lab 06/15/21 1802 06/16/21 0534 06/16/21 0811 06/17/21 0612 06/18/21 0447 06/18/21 1430 06/19/21 0520 06/20/21 0549 06/21/21 0445 06/22/21 0455  NA 128* 128* 128*   < > 131*  --  132* 128* 129* 127*  K 3.2* 3.0* 3.3*   < > 2.9*  --  3.0* 3.0* 3.9 3.7  CL 93* 97* 97*   < > 92*  --  89* 92* 94* 92*  CO2 20* 22 24   < > 32  --  32 26 28 23   GLUCOSE 91 101* 113*   < > 110*  --  105* 108* 106* 100*  BUN 10 11 10    < > <5*  --  6 7 8 6   CREATININE 0.55* 0.56* 0.48*   < > 0.33*  --  0.43* 0.48* 0.48* 0.39*  CALCIUM 8.1* 7.8* 7.8*   < > 8.4*  --  9.1 8.5* 8.7* 8.3*  MG  --   --   --    < >  --  1.5* 1.3* 1.3* 1.6* 1.6*  PHOS 2.6 2.5 2.6  --   --   --   --   --   --   --    < > = values in this interval not displayed.   GFR: Estimated Creatinine Clearance: 96.5 mL/min (A) (by C-G formula based on SCr of 0.39 mg/dL (L)). Liver Function Tests: Recent Labs  Lab 06/17/21 0612 06/18/21 0447 06/19/21 0520 06/20/21 0549 06/21/21 0445  AST 103* 67* 39 31 24  ALT 55* 42 31 23 19   ALKPHOS 43 44 57 61 52  BILITOT 0.8 0.9 1.1 1.2 1.1  PROT 5.1* 5.1* 5.7* 5.5* 6.0*  ALBUMIN 2.2* 2.2* 2.4* 2.2* 2.3*   No results for input(s): LIPASE, AMYLASE in the last 168 hours. No results for input(s): AMMONIA in the last 168 hours. Coagulation Profile: Recent Labs  Lab 06/15/21 1755 06/16/21 0811  INR 1.0 1.1   Cardiac Enzymes: Recent Labs  Lab 06/18/21 0447  CKTOTAL 179   BNP (last 3 results) No results for input(s): PROBNP in the last 8760 hours. HbA1C: No results for input(s): HGBA1C in the last 72 hours. CBG: No results for input(s): GLUCAP in the last 168 hours. Lipid Profile: No results for input(s): CHOL, HDL, LDLCALC, TRIG, CHOLHDL, LDLDIRECT in the last 72 hours. Thyroid Function Tests: No results for input(s): TSH, T4TOTAL, FREET4, T3FREE, THYROIDAB in the last 72 hours. Anemia Panel: No results for  input(s): VITAMINB12, FOLATE, FERRITIN, TIBC, IRON, RETICCTPCT in the last 72 hours. Sepsis Labs: Recent Labs  Lab 06/15/21 1755 06/15/21 1918 06/16/21 0811 06/17/21 0612  PROCALCITON  --   --  1.23 0.84  LATICACIDVEN 1.6 1.7  --   --  Recent Results (from the past 240 hour(s))  Resp Panel by RT-PCR (Flu A&B, Covid) Nasopharyngeal Swab     Status: None   Collection Time: 06/15/21  1:47 PM   Specimen: Nasopharyngeal Swab; Nasopharyngeal(NP) swabs in vial transport medium  Result Value Ref Range Status   SARS Coronavirus 2 by RT PCR NEGATIVE NEGATIVE Final    Comment: (NOTE) SARS-CoV-2 target nucleic acids are NOT DETECTED.  The SARS-CoV-2 RNA is generally detectable in upper respiratory specimens during the acute phase of infection. The lowest concentration of SARS-CoV-2 viral copies this assay can detect is 138 copies/mL. A negative result does not preclude SARS-Cov-2 infection and should not be used as the sole basis for treatment or other patient management decisions. A negative result may occur with  improper specimen collection/handling, submission of specimen other than nasopharyngeal swab, presence of viral mutation(s) within the areas targeted by this assay, and inadequate number of viral copies(<138 copies/mL). A negative result must be combined with clinical observations, patient history, and epidemiological information. The expected result is Negative.  Fact Sheet for Patients:  BloggerCourse.comhttps://www.fda.gov/media/152166/download  Fact Sheet for Healthcare Providers:  SeriousBroker.ithttps://www.fda.gov/media/152162/download  This test is no t yet approved or cleared by the [place] FDA and  has been authorized for detection and/or diagnosis of SARS-CoV-2 by FDA under an Emergency Use Authorization (EUA). This EUA will remain  in effect (meaning this test can be used) for the duration of the COVID-19 declaration under Section 564(b)(1) of the Act, 21 U.S.C.section 360bbb-3(b)(1),  unless the authorization is terminated  or revoked sooner.       Influenza A by PCR NEGATIVE NEGATIVE Final   Influenza B by PCR NEGATIVE NEGATIVE Final    Comment: (NOTE) The Xpert Xpress SARS-CoV-2/FLU/RSV plus assay is intended as an aid in the diagnosis of influenza from Nasopharyngeal swab specimens and should not be used as a sole basis for treatment. Nasal washings and aspirates are unacceptable for Xpert Xpress SARS-CoV-2/FLU/RSV testing.  Fact Sheet for Patients: BloggerCourse.comhttps://www.fda.gov/media/152166/download  Fact Sheet for Healthcare Providers: SeriousBroker.ithttps://www.fda.gov/media/152162/download  This test is not yet approved or cleared by the [place] FDA and has been authorized for detection and/or diagnosis of SARS-CoV-2 by FDA under an Emergency Use Authorization (EUA). This EUA will remain in effect (meaning this test can be used) for the duration of the COVID-19 declaration under Section 564(b)(1) of the Act, 21 U.S.C. section 360bbb-3(b)(1), unless the authorization is terminated or revoked.  Performed at [facility] [place] [facility], [address] [place], [place] [address]   Culture, blood (x 2)     Status: Abnormal   Collection Time: 06/15/21  5:56 PM   Specimen: BLOOD  Result Value Ref Range Status   Specimen Description   Final    BLOOD RIGHT ANTECUBITAL Performed at [facility], [address] [place], [place] [address]    Special Requests   Final    BOTTLES DRAWN AEROBIC AND ANAEROBIC Blood Culture adequate volume Performed at [facility], [address] [place], [place] [address]    Culture  Setup Time   Final    GRAM POSITIVE COCCI IN CLUSTERS IN BOTH AEROBIC AND ANAEROBIC BOTTLES CRITICAL RESULT CALLED TO, READ BACK BY AND VERIFIED WITH: Thana AtesHARMD J GADHIA [id number] AT 1226 BY CM Performed at [facility], [address] [place], [place] [address]    Culture STAPHYLOCOCCUS AUREUS (A)  Final   Report  Status 06/18/2021 FINAL  Final   Organism ID, Bacteria STAPHYLOCOCCUS  AUREUS  Final      Susceptibility   Staphylococcus aureus - MIC*    CIPROFLOXACIN >=8 RESISTANT Resistant     ERYTHROMYCIN <=0.25 SENSITIVE Sensitive     GENTAMICIN <=0.5 SENSITIVE Sensitive     OXACILLIN <=0.25 SENSITIVE Sensitive     TETRACYCLINE <=1 SENSITIVE Sensitive     VANCOMYCIN 1 SENSITIVE Sensitive     TRIMETH/SULFA <=10 SENSITIVE Sensitive     CLINDAMYCIN <=0.25 SENSITIVE Sensitive     RIFAMPIN <=0.5 SENSITIVE Sensitive     Inducible Clindamycin NEGATIVE Sensitive     * STAPHYLOCOCCUS AUREUS  Blood Culture ID Panel (Reflexed)     Status: Abnormal   Collection Time: 06/15/21  5:56 PM  Result Value Ref Range Status   Enterococcus faecalis NOT DETECTED NOT DETECTED Final   Enterococcus Faecium NOT DETECTED NOT DETECTED Final   Listeria monocytogenes NOT DETECTED NOT DETECTED Final   Staphylococcus species DETECTED (A) NOT DETECTED Final    Comment: CRITICAL RESULT CALLED TO, READ BACK BY AND VERIFIED WITH: PHARMD J GADHIA [id number] AT 1227 BY CM    Staphylococcus aureus (BCID) DETECTED (A) NOT DETECTED Final    Comment: CRITICAL RESULT CALLED TO, READ BACK BY AND VERIFIED WITH: PHARMD J GADHIA [id number] AT 1226 BY CM    Staphylococcus epidermidis NOT DETECTED NOT DETECTED Final   Staphylococcus lugdunensis NOT DETECTED NOT DETECTED Final   Streptococcus species NOT DETECTED NOT DETECTED Final   Streptococcus agalactiae NOT DETECTED NOT DETECTED Final   Streptococcus pneumoniae NOT DETECTED NOT DETECTED Final   Streptococcus pyogenes NOT DETECTED NOT DETECTED Final   A.calcoaceticus-baumannii NOT DETECTED NOT DETECTED Final   Bacteroides fragilis NOT DETECTED NOT DETECTED Final   Enterobacterales NOT DETECTED NOT DETECTED Final   Enterobacter cloacae complex NOT DETECTED NOT DETECTED Final   Escherichia coli NOT DETECTED NOT DETECTED Final   Klebsiella aerogenes NOT DETECTED NOT DETECTED Final   Klebsiella  oxytoca NOT DETECTED NOT DETECTED Final   Klebsiella pneumoniae NOT DETECTED NOT DETECTED Final   Proteus species NOT DETECTED NOT DETECTED Final   Salmonella species NOT DETECTED NOT DETECTED Final   Serratia marcescens NOT DETECTED NOT DETECTED Final   Haemophilus influenzae NOT DETECTED NOT DETECTED Final   Neisseria meningitidis NOT DETECTED NOT DETECTED Final   Pseudomonas aeruginosa NOT DETECTED NOT DETECTED Final   Stenotrophomonas maltophilia NOT DETECTED NOT DETECTED Final   Candida albicans NOT DETECTED NOT DETECTED Final   Candida auris NOT DETECTED NOT DETECTED Final   Candida glabrata NOT DETECTED NOT DETECTED Final   Candida krusei NOT DETECTED NOT DETECTED Final   Candida parapsilosis NOT DETECTED NOT DETECTED Final   Candida tropicalis NOT DETECTED NOT DETECTED Final   Cryptococcus neoformans/gattii NOT DETECTED NOT DETECTED Final   Meth resistant mecA/C and MREJ NOT DETECTED NOT DETECTED Final    Comment: Performed at [facility], [address] [place], [place] [address]  Culture, blood (x 2)     Status: Abnormal   Collection Time: 06/15/21  6:01 PM   Specimen: BLOOD  Result Value Ref Range Status   Specimen Description   Final    BLOOD BLOOD LEFT HAND Performed at [facility], [address], [place], [place] [address]    Special Requests   Final    BOTTLES DRAWN AEROBIC ONLY Blood Culture adequate volume Performed at [facility], [address], [place], [place] [address]    Culture  Setup Time   Final  GRAM POSITIVE COCCI IN CLUSTERS AEROBIC BOTTLE ONLY CRITICAL VALUE NOTED.  VALUE IS CONSISTENT WITH PREVIOUSLY REPORTED AND CALLED VALUE.    Culture (A)  Final    STAPHYLOCOCCUS AUREUS SUSCEPTIBILITIES PERFORMED ON PREVIOUS CULTURE WITHIN THE LAST 5 DAYS. Performed at [facility], [address], [place], [place] [address]    Report Status 06/18/2021 FINAL  Final  Culture, blood (routine x 2)      Status: None   Collection Time: 06/17/21  4:54 AM   Specimen: BLOOD RIGHT HAND  Result Value Ref Range Status   Specimen Description   Final    BLOOD RIGHT HAND Performed at [facility], [address], [place], [place] [address]    Special Requests   Final    BOTTLES DRAWN AEROBIC ONLY Blood Culture results may not be optimal due to an excessive volume of blood received in culture bottles Performed at [facility], [address] [place], [place] [address]    Culture   Final    NO GROWTH 5 DAYS Performed at [facility], [address], [place], [place] [address]    Report Status 06/22/2021 FINAL  Final  Culture, blood (routine x 2)     Status: None   Collection Time: 06/17/21  4:55 AM   Specimen: BLOOD LEFT ARM  Result Value Ref Range Status   Specimen Description   Final    BLOOD LEFT ARM Performed at [facility], [address] [place], [place] [address]    Special Requests   Final    BOTTLES DRAWN AEROBIC ONLY Blood Culture adequate volume Performed at [facility], [address], [place], [place] [address]    Culture   Final    NO GROWTH 5 DAYS Performed at [facility], [address] [place], [place] [address]    Report Status 06/22/2021 FINAL  Final         Radiology Studies: ECHO TEE  Result Date: 06/22/2021    TRANSESOPHOGEAL ECHO REPORT   Patient Name:   MACEDONIO SCALLON [facility] Date of Exam: 06/22/2021 Medical Rec #:  [id number]          Height:       65.0 in Accession #:    [id number]         Weight:       179.9 lb Date of Birth:  05-27-1961           BSA:          1.891 m Patient Age:    59 years           BP:           110/78 mmHg Patient Gender: M                  HR:           91 bpm. Exam Location:  Inpatient Procedure: Transesophageal Echo and Color Doppler Indications:     Bacteremia  History:         Patient has prior history of Echocardiogram examinations, most                   recent 06/18/2021. Risk Factors:ETOH.  Sonographer:     Eulah Pont RDCS Referring Phys:  [id number] Dorcas Carrow Diagnosing Phys: [name] MD PROCEDURE: After discussion of the risks and benefits of a TEE, an informed consent was obtained from the patient. The transesophogeal  probe was passed without difficulty through the esophogus of the patient. Sedation performed by different physician. The patient was monitored while under deep sedation. Anesthestetic sedation was provided intravenously by Anesthesiology: 230.76mg  of Propofol,  of Lidocaine. The patient developed no complications during the procedure. IMPRESSIONS  1. No vegetations.  2. Left ventricular ejection fraction, by estimation, is 55 to 60%. The left ventricle has normal function.  3. Right ventricular systolic function is normal. The right ventricular size is normal.  4. No left atrial/left atrial appendage thrombus was detected.  5. The mitral valve is normal in structure. Mild mitral valve regurgitation.  6. The aortic valve is tricuspid. Aortic valve regurgitation is mild.  7. There is mild (Grade II) plaque involving the descending aorta. FINDINGS  Left Ventricle: Left ventricular ejection fraction, by estimation, is 55 to 60%. The left ventricle has normal function. The left ventricular internal cavity size was normal in size. Right Ventricle: The right ventricular size is normal.Right ventricular systolic function is normal. Left Atrium: Left atrial size was normal in size. No left atrial/left atrial appendage thrombus was detected. Right Atrium: Right atrial size was normal in size. Pericardium: There is no evidence of pericardial effusion. Mitral Valve: The mitral valve is normal in structure. Mild mitral valve regurgitation. Tricuspid Valve: The tricuspid valve is normal in structure. Tricuspid valve regurgitation is mild. Aortic Valve: The aortic valve is tricuspid. Aortic valve regurgitation is mild. Pulmonic Valve: The  pulmonic valve was normal in structure. Pulmonic valve regurgitation is trivial. Aorta: The aortic root is normal in size and structure. There is mild (Grade II) plaque involving the descending aorta. IAS/Shunts: No atrial level shunt detected by color flow Doppler. Additional Comments: No vegetations. [name] MD Electronically signed by [name] MD Signature Date/Time: 06/22/2021/1:33:54 PM    Final         Scheduled Meds:  ceFAZolin (ANCEF) IVPB 2 gram/100 mL NS (Mini-Bag Plus)  2 g Intravenous Q8H   feeding supplement  237 mL Oral BID BM   folic acid  1 mg Oral Daily   multivitamin with minerals  1 tablet Oral Daily   pneumococcal 23 valent vaccine  0.5 mL Intramuscular Tomorrow-1000   simethicone  80 mg Oral QID   thiamine  100 mg Oral Daily   Or   thiamine  100 mg Intravenous Daily   vitamin B-12  1,000 mcg Oral Daily   Continuous Infusions:   LOS: 7 days    Time spent: 30 minutes    Dorcas Carrow, MD [facility] Pager [phone]

## 2021-06-22 NOTE — Progress Notes (Signed)
  Echocardiogram Echocardiogram Transesophageal has been performed.  Joseph Bell 06/22/2021, 1:28 PM

## 2021-06-22 NOTE — Anesthesia Postprocedure Evaluation (Signed)
Anesthesia Post Note  Patient: Joseph Bell  Procedure(s) Performed: TRANSESOPHAGEAL ECHOCARDIOGRAM (TEE)     Patient location during evaluation: Endoscopy Anesthesia Type: MAC Level of consciousness: awake and alert Pain management: pain level controlled Vital Signs Assessment: post-procedure vital signs reviewed and stable Respiratory status: spontaneous breathing, nonlabored ventilation, respiratory function stable and patient connected to nasal cannula oxygen Cardiovascular status: stable and blood pressure returned to baseline Postop Assessment: no apparent nausea or vomiting Anesthetic complications: no   No notable events documented.  Last Vitals:  Vitals:   06/22/21 1221 06/22/21 1313  BP: 106/75 106/77  Pulse: 82 83  Resp: (!) 28 19  Temp:  36.5 C  SpO2: 94% 96%    Last Pain:  Vitals:   06/22/21 1313  TempSrc: Oral  PainSc:                  Rosalia

## 2021-06-22 NOTE — Progress Notes (Signed)
[facility]   Reason for visit: Follow up on bacteremia  Interval History: repeat blood cultures ngtd, now final.  TEE without vegetation.  No fever.  WBC 15.6.   Day 7 cefazolin Day 8 total antibiotics  Physical Exam: Constitutional:  Vitals:   06/22/21 1221 06/22/21 1313  BP: 106/75 106/77  Pulse: 82 83  Resp: (!) 28 19  Temp:  97.7 F (36.5 C)  SpO2: 94% 96%   patient appears in NAD Respiratory: Normal respiratory effort; CTA B Cardiovascular: RRR GI: soft, nt, nd  Review of Systems: Constitutional: negative for fevers and chills Gastrointestinal: negative for nausea and diarrhea Integument/breast: negative for rash  Lab Results  Component Value Date   WBC 15.6 (H) 06/22/2021   HGB 12.5 (L) 06/22/2021   HCT 36.7 (L) 06/22/2021   MCV 103.4 (H) 06/22/2021   PLT 262 06/22/2021    Lab Results  Component Value Date   CREATININE 0.39 (L) 06/22/2021   BUN 6 06/22/2021   NA 127 (L) 06/22/2021   K 3.7 06/22/2021   CL 92 (L) 06/22/2021   CO2 23 06/22/2021    Lab Results  Component Value Date   ALT 19 06/21/2021   AST 24 06/21/2021   ALKPHOS 52 06/21/2021     Microbiology: Recent Results (from the past 240 hour(s))  Resp Panel by RT-PCR (Flu A&B, Covid) Nasopharyngeal Swab     Status: None   Collection Time: 06/15/21  1:47 PM   Specimen: Nasopharyngeal Swab; Nasopharyngeal(NP) swabs in vial transport medium  Result Value Ref Range Status   SARS Coronavirus 2 by RT PCR NEGATIVE NEGATIVE Final    Comment: (NOTE) SARS-CoV-2 target nucleic acids are NOT DETECTED.  The SARS-CoV-2 RNA is generally detectable in upper respiratory specimens during the acute phase of infection. The lowest concentration of SARS-CoV-2 viral copies this assay can detect is 138 copies/mL. A negative result does not preclude SARS-Cov-2 infection and should not be used as the sole basis for treatment or other patient management decisions. A negative result  may occur with  improper specimen collection/handling, submission of specimen other than nasopharyngeal swab, presence of viral mutation(s) within the areas targeted by this assay, and inadequate number of viral copies(<138 copies/mL). A negative result must be combined with clinical observations, patient history, and epidemiological information. The expected result is Negative.  Fact Sheet for Patients:  BloggerCourse.com  Fact Sheet for Healthcare Providers:  SeriousBroker.it  This test is no t yet approved or cleared by the [place] FDA and  has been authorized for detection and/or diagnosis of SARS-CoV-2 by FDA under an Emergency Use Authorization (EUA). This EUA will remain  in effect (meaning this test can be used) for the duration of the COVID-19 declaration under Section 564(b)(1) of the Act, 21 U.S.C.section 360bbb-3(b)(1), unless the authorization is terminated  or revoked sooner.       Influenza A by PCR NEGATIVE NEGATIVE Final   Influenza B by PCR NEGATIVE NEGATIVE Final    Comment: (NOTE) The Xpert Xpress SARS-CoV-2/FLU/RSV plus assay is intended as an aid in the diagnosis of influenza from Nasopharyngeal swab specimens and should not be used as a sole basis for treatment. Nasal washings and aspirates are unacceptable for Xpert Xpress SARS-CoV-2/FLU/RSV testing.  Fact Sheet for Patients: BloggerCourse.com  Fact Sheet for Healthcare Providers: SeriousBroker.it  This test is not yet approved or cleared by the [place] FDA and has been authorized for detection and/or diagnosis of SARS-CoV-2 by  FDA under an Emergency Use Authorization (EUA). This EUA will remain in effect (meaning this test can be used) for the duration of the COVID-19 declaration under Section 564(b)(1) of the Act, 21 U.S.C. section 360bbb-3(b)(1), unless the authorization is terminated  or revoked.  Performed at [facility], [address] [place], [place] [address]   Culture, blood (x 2)     Status: Abnormal   Collection Time: 06/15/21  5:56 PM   Specimen: BLOOD  Result Value Ref Range Status   Specimen Description   Final    BLOOD RIGHT ANTECUBITAL Performed at [facility], [address] [place], [place] [address]    Special Requests   Final    BOTTLES DRAWN AEROBIC AND ANAEROBIC Blood Culture adequate volume Performed at [facility], [address] [place], [place] [address]    Culture  Setup Time   Final    GRAM POSITIVE COCCI IN CLUSTERS IN BOTH AEROBIC AND ANAEROBIC BOTTLES CRITICAL RESULT CALLED TO, READ BACK BY AND VERIFIED WITH: Thana AtesHARMD J GADHIA [id number] AT 1226 BY CM Performed at [facility], [address] [place], [place] [address]    Culture STAPHYLOCOCCUS AUREUS (A)  Final   Report Status 06/18/2021 FINAL  Final   Organism ID, Bacteria STAPHYLOCOCCUS AUREUS  Final      Susceptibility   Staphylococcus aureus - MIC*    CIPROFLOXACIN >=8 RESISTANT Resistant     ERYTHROMYCIN <=0.25 SENSITIVE Sensitive     GENTAMICIN <=0.5 SENSITIVE Sensitive     OXACILLIN <=0.25 SENSITIVE Sensitive     TETRACYCLINE <=1 SENSITIVE Sensitive     VANCOMYCIN 1 SENSITIVE Sensitive     TRIMETH/SULFA <=10 SENSITIVE Sensitive     CLINDAMYCIN <=0.25 SENSITIVE Sensitive     RIFAMPIN <=0.5 SENSITIVE Sensitive     Inducible Clindamycin NEGATIVE Sensitive     * STAPHYLOCOCCUS AUREUS  Blood Culture ID Panel (Reflexed)     Status: Abnormal   Collection Time: 06/15/21  5:56 PM  Result Value Ref Range Status   Enterococcus faecalis NOT DETECTED NOT DETECTED Final   Enterococcus Faecium NOT DETECTED NOT DETECTED Final   Listeria monocytogenes NOT DETECTED NOT DETECTED Final   Staphylococcus species DETECTED (A) NOT DETECTED Final    Comment: CRITICAL RESULT CALLED TO, READ BACK BY AND VERIFIED WITH: PHARMD J  GADHIA [id number] AT 1227 BY CM    Staphylococcus aureus (BCID) DETECTED (A) NOT DETECTED Final    Comment: CRITICAL RESULT CALLED TO, READ BACK BY AND VERIFIED WITH: PHARMD J GADHIA [id number] AT 1226 BY CM    Staphylococcus epidermidis NOT DETECTED NOT DETECTED Final   Staphylococcus lugdunensis NOT DETECTED NOT DETECTED Final   Streptococcus species NOT DETECTED NOT DETECTED Final   Streptococcus agalactiae NOT DETECTED NOT DETECTED Final   Streptococcus pneumoniae NOT DETECTED NOT DETECTED Final   Streptococcus pyogenes NOT DETECTED NOT DETECTED Final   A.calcoaceticus-baumannii NOT DETECTED NOT DETECTED Final   Bacteroides fragilis NOT DETECTED NOT DETECTED Final   Enterobacterales NOT DETECTED NOT DETECTED Final   Enterobacter cloacae complex NOT DETECTED NOT DETECTED Final   Escherichia coli NOT DETECTED NOT DETECTED Final   Klebsiella aerogenes NOT DETECTED NOT DETECTED Final   Klebsiella oxytoca NOT DETECTED NOT DETECTED Final   Klebsiella pneumoniae NOT DETECTED NOT DETECTED Final   Proteus species NOT DETECTED NOT DETECTED Final   Salmonella species NOT DETECTED NOT DETECTED Final   Serratia marcescens NOT DETECTED NOT DETECTED Final   Haemophilus  influenzae NOT DETECTED NOT DETECTED Final   Neisseria meningitidis NOT DETECTED NOT DETECTED Final   Pseudomonas aeruginosa NOT DETECTED NOT DETECTED Final   Stenotrophomonas maltophilia NOT DETECTED NOT DETECTED Final   Candida albicans NOT DETECTED NOT DETECTED Final   Candida auris NOT DETECTED NOT DETECTED Final   Candida glabrata NOT DETECTED NOT DETECTED Final   Candida krusei NOT DETECTED NOT DETECTED Final   Candida parapsilosis NOT DETECTED NOT DETECTED Final   Candida tropicalis NOT DETECTED NOT DETECTED Final   Cryptococcus neoformans/gattii NOT DETECTED NOT DETECTED Final   Meth resistant mecA/C and MREJ NOT DETECTED NOT DETECTED Final    Comment: Performed at [facility], [address], [place], [place] [address]   Culture, blood (x 2)     Status: Abnormal   Collection Time: 06/15/21  6:01 PM   Specimen: BLOOD  Result Value Ref Range Status   Specimen Description   Final    BLOOD BLOOD LEFT HAND Performed at [facility], [address], [place], [place] [address]    Special Requests   Final    BOTTLES DRAWN AEROBIC ONLY Blood Culture adequate volume Performed at [facility], [address] [place], [place] [address]    Culture  Setup Time   Final    GRAM POSITIVE COCCI IN CLUSTERS AEROBIC BOTTLE ONLY CRITICAL VALUE NOTED.  VALUE IS CONSISTENT WITH PREVIOUSLY REPORTED AND CALLED VALUE.    Culture (A)  Final    STAPHYLOCOCCUS AUREUS SUSCEPTIBILITIES PERFORMED ON PREVIOUS CULTURE WITHIN THE LAST 5 DAYS. Performed at [facility], [address], [place], [place] [address]    Report Status 06/18/2021 FINAL  Final  Culture, blood (routine x 2)     Status: None   Collection Time: 06/17/21  4:54 AM   Specimen: BLOOD RIGHT HAND  Result Value Ref Range Status   Specimen Description   Final    BLOOD RIGHT HAND Performed at [facility] [address] [place], [place] [address]    Special Requests   Final    BOTTLES DRAWN AEROBIC ONLY Blood Culture results may not be optimal due to an excessive volume of blood received in culture bottles Performed at [facility], [address] [place], [place] [address]    Culture   Final    NO GROWTH 5 DAYS Performed at [facility], [address], [place], [place] [address]    Report Status 06/22/2021 FINAL  Final  Culture, blood (routine x 2)     Status: None   Collection Time: 06/17/21  4:55 AM   Specimen: BLOOD LEFT ARM  Result Value Ref Range Status   Specimen Description   Final    BLOOD LEFT ARM Performed at [facility], [address], [place], [place] [address]    Special Requests   Final    BOTTLES DRAWN AEROBIC ONLY Blood  Culture adequate volume Performed at [facility], [address], [place], [place] [address]    Culture   Final    NO GROWTH 5 DAYS Performed at [facility], [address] [place], [place] [address]    Report Status 06/22/2021 FINAL  Final    Impression/Plan:  1. MSSA bacteremia - repeat cultures negative, TEE negative for vegetation, no new concerns.  At this point, he has been nearly treated and will give a dose of oritavancin x 1 and no further antibiotics  will be indicated at discharge.    2.  Homelessness - asking about where he can go after hospitalization.  He is working with social work.    3.  Weakness - continues to have some trouble walking. May benefit with rehab and has been seen by PT.    I will sign off, call with questions.

## 2021-06-22 NOTE — Anesthesia Preprocedure Evaluation (Signed)
Anesthesia Evaluation  Patient identified by MRN, date of birth, ID band Patient awake    Reviewed: Allergy & Precautions, H&P , NPO status , Patient's Chart, lab work & pertinent test results  Airway Mallampati: II   Neck ROM: full    Dental   Pulmonary Current Smoker and Patient abstained from smoking.,    breath sounds clear to auscultation       Cardiovascular  Rhythm:regular Rate:Normal  bacteremia   Neuro/Psych    GI/Hepatic (+)     substance abuse  alcohol use,   Endo/Other    Renal/GU      Musculoskeletal   Abdominal   Peds  Hematology   Anesthesia Other Findings   Reproductive/Obstetrics                             Anesthesia Physical Anesthesia Plan  ASA: 2  Anesthesia Plan: MAC   Post-op Pain Management:    Induction: Intravenous  PONV Risk Score and Plan: 0 and Propofol infusion, Treatment may vary due to age or medical condition and Midazolam  Airway Management Planned: Nasal Cannula  Additional Equipment:   Intra-op Plan:   Post-operative Plan:   Informed Consent: I have reviewed the patients History and Physical, chart, labs and discussed the procedure including the risks, benefits and alternatives for the proposed anesthesia with the patient or authorized representative who has indicated his/her understanding and acceptance.     Dental advisory given  Plan Discussed with: CRNA, Anesthesiologist and Surgeon  Anesthesia Plan Comments:         Anesthesia Quick Evaluation

## 2021-06-22 NOTE — Transfer of Care (Signed)
Immediate Anesthesia Transfer of Care Note  Patient: Joseph Bell  Procedure(s) Performed: TRANSESOPHAGEAL ECHOCARDIOGRAM (TEE)  Patient Location: Endoscopy Unit  Anesthesia Type:MAC  Level of Consciousness: awake and alert   Airway & Oxygen Therapy: Patient Spontanous Breathing and Patient connected to nasal cannula oxygen  Post-op Assessment: Report given to RN and Post -op Vital signs reviewed and stable  Post vital signs: Reviewed and stable  Last Vitals:  Vitals Value Taken Time  BP 84/58 06/22/21 1200  Temp    Pulse 88 06/22/21 1201  Resp 26 06/22/21 1201  SpO2 93 % 06/22/21 1201  Vitals shown include unvalidated device data.  Last Pain:  Vitals:   06/22/21 1056  TempSrc: Oral  PainSc: 0-No pain      Patients Stated Pain Goal: 7 (43/20/03 7944)  Complications: No notable events documented.

## 2021-06-22 NOTE — TOC Progression Note (Signed)
Transition of Care [facility]) - Progression Note    Patient Details  Name: Joseph Bell MRN: [id number] Date of Birth: [birth year]  Transition of Care [facility] CM/SW Contact  Ida Rogue, [place] Phone Number: 06/22/2021, 2:03 PM  Clinical Narrative:  Per supervisor, contacted [facility].  They have a 2 year wait list for housing.  If patient wants to get on list, must contact them post d/c by phone or in person to do paperwork.  Referred patient to Kandy Garrison at [facility] for help with disability application. TOC will continue to follow during the course of hospitalization.      Expected Discharge Plan: Home/Self Care Barriers to Discharge: No Barriers Identified  Expected Discharge Plan and Services Expected Discharge Plan: Home/Self Care In-house Referral: Clinical Social Work     Living arrangements for the past 2 months: No permanent address                                       Social Determinants of Health (SDOH) Interventions    Readmission Risk Interventions No flowsheet data found.

## 2021-06-22 NOTE — Interval H&P Note (Signed)
History and Physical Interval Note:  06/22/2021 11:02 AM  Joseph Bell  has presented today for surgery, with the diagnosis of BACTEREMIA.  The various methods of treatment have been discussed with the patient and family. After consideration of risks, benefits and other options for treatment, the patient has consented to  Procedure(s): TRANSESOPHAGEAL ECHOCARDIOGRAM (TEE) (N/A) as a surgical intervention.  The patient's history has been reviewed, patient examined, no change in status, stable for surgery.  I have reviewed the patient's chart and labs.  Questions were answered to the patient's satisfaction.     [name]

## 2021-06-22 NOTE — Progress Notes (Signed)
OT Cancellation Note  Patient Details Name: Joseph Bell MRN: [id number] DOB: 1961/09/02   Cancelled Treatment:    Reason Eval/Treat Not Completed: Patient declined, no reason specified patient has scheduled procedure this afternoon. Le[facility] at 10 am. Will continue to follow Sharyn Blitz OTR/L, MS Acute Rehabilitation Department Office# [phone] Pager# [phone]   Chalmers Guest Sundra Haddix 06/22/2021, 8:42 AM

## 2021-06-23 LAB — MAGNESIUM: Magnesium: 1.6 mg/dL — ABNORMAL LOW (ref 1.7–2.4)

## 2021-06-23 MED ORDER — MAGNESIUM SULFATE 2 GM/50ML IV SOLN
2.0000 g | Freq: Once | INTRAVENOUS | Status: AC
  Administered 2021-06-23: 2 g via INTRAVENOUS
  Filled 2021-06-23: qty 50

## 2021-06-23 MED ORDER — MAGNESIUM OXIDE -MG SUPPLEMENT 400 (240 MG) MG PO TABS
400.0000 mg | ORAL_TABLET | Freq: Two times a day (BID) | ORAL | Status: DC
  Administered 2021-06-23 – 2021-06-26 (×7): 400 mg via ORAL
  Filled 2021-06-23 (×7): qty 1

## 2021-06-23 NOTE — Progress Notes (Signed)
PROGRESS NOTE    Joseph Bell  GNF:[id number] DOB: 02/13/1961 DOA: 06/15/2021 PCP: [name], [name], NP    Brief Narrative:  60 year old gentleman who is homeless and continues to drink alcohol presented to the emergency room on 7/12 with generalized weakness.  He was found to have rhabdomyolysis, hyponatremia and ultimately found to have staph aureus bacteremia.  Getting IV antibiotics in the hospital.  No obvious source of infection.   Assessment & Plan:   Principal Problem:   MSSA bacteremia Active Problems:   Hyponatremia   Alcohol abuse  MSSA bacteremia: Blood culture 7/12 MSSA.  Blood cultures 7/14 no growth. TTE with no evidence of endocarditis. TEE with no evidence of vegetations. Primary source unknown.  Treated with Ancef for 1 week.   Patient was given Orbactiv 1200 mg once by infectious disease.  Completed antibiotic therapy.  Dilutional hyponatremia: Resolved with isotonic fluid and ultimately with regular diet.  Monitor.  Electrolytes are fairly normal.  Replace magnesium further.  Hypokalemia/hypomagnesemia: Replaced with oral and IV replacement.  Further replace magnesium today.  Keep on a scheduled magnesium.  Alcoholism: So far no evidence of alcohol withdrawal.  Remains on symptomatic treatment with thiamine, B12 and multivitamins.  Patient has completed his antibiotic therapy.  He is frail and debilitated and deconditioned.  Out of bed.  Work with PT OT to determine next level of rehab for him.   DVT prophylaxis: SCDs Start: 06/15/21 1735   Code Status: Full code Family Communication: None Disposition Plan: Status is: Inpatient  Remains inpatient appropriate because:IV treatments appropriate due to intensity of illness or inability to take PO and Inpatient level of care appropriate due to severity of illness  Dispo: The patient is from:  [place]               Anticipated d/c is to: Unknown.              Patient is clinically stable for discharge if  he is safe to ambulate.   Difficult to place patient Yes         Consultants:  Infectious disease  Procedures:  None  Antimicrobials:  Ancef 7/13--- 7/19 7/20, Orbactiv 1200 mg once.   Subjective: Patient seen and examined.  He is now producing dark rusty phlegm. Remains afebrile.  He has pain on his left chest wall where he had injured himself.  Not taking deep breaths in.  He is not sure how he will do on walking.  Objective: Vitals:   06/22/21 1313 06/22/21 2049 06/23/21 0502 06/23/21 1325  BP: 106/77 117/69 110/65 110/74  Pulse: 83 99 97 89  Resp: 19 20 18 17   Temp: 97.7 F (36.5 C) 97.8 F (36.6 C) 98.2 F (36.8 C) 98.3 F (36.8 C)  TempSrc: Oral Oral Oral Oral  SpO2: 96% 95% 94% 96%  Weight:      Height:        Intake/Output Summary (Last 24 hours) at 06/23/2021 1340 Last data filed at 06/23/2021 1027 Gross per 24 hour  Intake 1659 ml  Output 1550 ml  Net 109 ml   Filed Weights   06/15/21 1131  Weight: 81.6 kg    Examination:  General exam: Mildly anxious.  Moderate pain on incentive spirometry and mobility on the left side. Respiratory system: Mostly clear.  Some conducted airway sounds. Cardiovascular system: S1 & S2 heard, RRR. No JVD, murmurs, rubs, gallops or clicks. No pedal edema. Gastrointestinal system: Abdomen is nondistended, soft and nontender. No organomegaly  or masses felt. Normal bowel sounds heard. Central nervous system: Alert and oriented. No focal neurological deficits. Extremities: Symmetric 5 x 5 power. Skin: No rashes, lesions or ulcers Psychiatry: Judgement and insight appear normal.  Flat affect.    Data Reviewed: I have personally reviewed following labs and imaging studies  CBC: Recent Labs  Lab 06/18/21 0447 06/19/21 0520 06/20/21 0549 06/21/21 0445 06/22/21 0455  WBC 14.1* 14.1* 13.4* 13.5* 15.6*  NEUTROABS  --  10.2*  --   --   --   HGB 12.8* 13.4 12.6* 13.3 12.5*  HCT 35.9* 38.6* 35.8* 39.0 36.7*  MCV  101.7* 101.8* 101.4* 104.0* 103.4*  PLT 136* 188 212 245 262   Basic Metabolic Panel: Recent Labs  Lab 06/18/21 0447 06/18/21 1430 06/19/21 0520 06/20/21 0549 06/21/21 0445 06/22/21 0455 06/23/21 0433  NA 131*  --  132* 128* 129* 127*  --   K 2.9*  --  3.0* 3.0* 3.9 3.7  --   CL 92*  --  89* 92* 94* 92*  --   CO2 32  --  32 26 28 23   --   GLUCOSE 110*  --  105* 108* 106* 100*  --   BUN <5*  --  6 7 8 6   --   CREATININE 0.33*  --  0.43* 0.48* 0.48* 0.39*  --   CALCIUM 8.4*  --  9.1 8.5* 8.7* 8.3*  --   MG  --    < > 1.3* 1.3* 1.6* 1.6* 1.6*   < > = values in this interval not displayed.   GFR: Estimated Creatinine Clearance: 96.5 mL/min (A) (by C-G formula based on SCr of 0.39 mg/dL (L)). Liver Function Tests: Recent Labs  Lab 06/17/21 0612 06/18/21 0447 06/19/21 0520 06/20/21 0549 06/21/21 0445  AST 103* 67* 39 31 24  ALT 55* 42 31 23 19   ALKPHOS 43 44 57 61 52  BILITOT 0.8 0.9 1.1 1.2 1.1  PROT 5.1* 5.1* 5.7* 5.5* 6.0*  ALBUMIN 2.2* 2.2* 2.4* 2.2* 2.3*   No results for input(s): LIPASE, AMYLASE in the last 168 hours. No results for input(s): AMMONIA in the last 168 hours. Coagulation Profile: No results for input(s): INR, PROTIME in the last 168 hours.  Cardiac Enzymes: Recent Labs  Lab 06/18/21 0447  CKTOTAL 179   BNP (last 3 results) No results for input(s): PROBNP in the last 8760 hours. HbA1C: No results for input(s): HGBA1C in the last 72 hours. CBG: No results for input(s): GLUCAP in the last 168 hours. Lipid Profile: No results for input(s): CHOL, HDL, LDLCALC, TRIG, CHOLHDL, LDLDIRECT in the last 72 hours. Thyroid Function Tests: No results for input(s): TSH, T4TOTAL, FREET4, T3FREE, THYROIDAB in the last 72 hours. Anemia Panel: No results for input(s): VITAMINB12, FOLATE, FERRITIN, TIBC, IRON, RETICCTPCT in the last 72 hours. Sepsis Labs: Recent Labs  Lab 06/17/21 0612  PROCALCITON 0.84    Recent Results (from the past 240 hour(s))   Resp Panel by RT-PCR (Flu A&B, Covid) Nasopharyngeal Swab     Status: None   Collection Time: 06/15/21  1:47 PM   Specimen: Nasopharyngeal Swab; Nasopharyngeal(NP) swabs in vial transport medium  Result Value Ref Range Status   SARS Coronavirus 2 by RT PCR NEGATIVE NEGATIVE Final    Comment: (NOTE) SARS-CoV-2 target nucleic acids are NOT DETECTED.  The SARS-CoV-2 RNA is generally detectable in upper respiratory specimens during the acute phase of infection. The lowest concentration of SARS-CoV-2 viral copies this assay can detect  is 138 copies/mL. A negative result does not preclude SARS-Cov-2 infection and should not be used as the sole basis for treatment or other patient management decisions. A negative result may occur with  improper specimen collection/handling, submission of specimen other than nasopharyngeal swab, presence of viral mutation(s) within the areas targeted by this assay, and inadequate number of viral copies(<138 copies/mL). A negative result must be combined with clinical observations, patient history, and epidemiological information. The expected result is Negative.  Fact Sheet for Patients:  BloggerCourse.com  Fact Sheet for Healthcare Providers:  SeriousBroker.it  This test is no t yet approved or cleared by the [place] FDA and  has been authorized for detection and/or diagnosis of SARS-CoV-2 by FDA under an Emergency Use Authorization (EUA). This EUA will remain  in effect (meaning this test can be used) for the duration of the COVID-19 declaration under Section 564(b)(1) of the Act, 21 U.S.C.section 360bbb-3(b)(1), unless the authorization is terminated  or revoked sooner.       Influenza A by PCR NEGATIVE NEGATIVE Final   Influenza B by PCR NEGATIVE NEGATIVE Final    Comment: (NOTE) The Xpert Xpress SARS-CoV-2/FLU/RSV plus assay is intended as an aid in the diagnosis of influenza from  Nasopharyngeal swab specimens and should not be used as a sole basis for treatment. Nasal washings and aspirates are unacceptable for Xpert Xpress SARS-CoV-2/FLU/RSV testing.  Fact Sheet for Patients: BloggerCourse.com  Fact Sheet for Healthcare Providers: SeriousBroker.it  This test is not yet approved or cleared by the [place] FDA and has been authorized for detection and/or diagnosis of SARS-CoV-2 by FDA under an Emergency Use Authorization (EUA). This EUA will remain in effect (meaning this test can be used) for the duration of the COVID-19 declaration under Section 564(b)(1) of the Act, 21 U.S.C. section 360bbb-3(b)(1), unless the authorization is terminated or revoked.  Performed at [facility], [address] [place], [place] [address]   Culture, blood (x 2)     Status: Abnormal   Collection Time: 06/15/21  5:56 PM   Specimen: BLOOD  Result Value Ref Range Status   Specimen Description   Final    BLOOD RIGHT ANTECUBITAL Performed at [facility], [address], [place], [place] [address]    Special Requests   Final    BOTTLES DRAWN AEROBIC AND ANAEROBIC Blood Culture adequate volume Performed at [facility], [address] [place], [place] [address]    Culture  Setup Time   Final    GRAM POSITIVE COCCI IN CLUSTERS IN BOTH AEROBIC AND ANAEROBIC BOTTLES CRITICAL RESULT CALLED TO, READ BACK BY AND VERIFIED WITH: Thana Ates [id number] AT 1226 BY CM Performed at [facility], [address] [place], [place] [address]    Culture STAPHYLOCOCCUS AUREUS (A)  Final   Report Status 06/18/2021 FINAL  Final   Organism ID, Bacteria STAPHYLOCOCCUS AUREUS  Final      Susceptibility   Staphylococcus aureus - MIC*    CIPROFLOXACIN >=8 RESISTANT Resistant     ERYTHROMYCIN <=0.25 SENSITIVE Sensitive     GENTAMICIN <=0.5 SENSITIVE Sensitive     OXACILLIN  <=0.25 SENSITIVE Sensitive     TETRACYCLINE <=1 SENSITIVE Sensitive     VANCOMYCIN 1 SENSITIVE Sensitive     TRIMETH/SULFA <=10 SENSITIVE Sensitive     CLINDAMYCIN <=0.25 SENSITIVE Sensitive     RIFAMPIN <=0.5 SENSITIVE Sensitive     Inducible Clindamycin NEGATIVE Sensitive     *  STAPHYLOCOCCUS AUREUS  Blood Culture ID Panel (Reflexed)     Status: Abnormal   Collection Time: 06/15/21  5:56 PM  Result Value Ref Range Status   Enterococcus faecalis NOT DETECTED NOT DETECTED Final   Enterococcus Faecium NOT DETECTED NOT DETECTED Final   Listeria monocytogenes NOT DETECTED NOT DETECTED Final   Staphylococcus species DETECTED (A) NOT DETECTED Final    Comment: CRITICAL RESULT CALLED TO, READ BACK BY AND VERIFIED WITH: PHARMD J GADHIA [id number] AT 1227 BY CM    Staphylococcus aureus (BCID) DETECTED (A) NOT DETECTED Final    Comment: CRITICAL RESULT CALLED TO, READ BACK BY AND VERIFIED WITH: PHARMD J GADHIA [id number] AT 1226 BY CM    Staphylococcus epidermidis NOT DETECTED NOT DETECTED Final   Staphylococcus lugdunensis NOT DETECTED NOT DETECTED Final   Streptococcus species NOT DETECTED NOT DETECTED Final   Streptococcus agalactiae NOT DETECTED NOT DETECTED Final   Streptococcus pneumoniae NOT DETECTED NOT DETECTED Final   Streptococcus pyogenes NOT DETECTED NOT DETECTED Final   A.calcoaceticus-baumannii NOT DETECTED NOT DETECTED Final   Bacteroides fragilis NOT DETECTED NOT DETECTED Final   Enterobacterales NOT DETECTED NOT DETECTED Final   Enterobacter cloacae complex NOT DETECTED NOT DETECTED Final   Escherichia coli NOT DETECTED NOT DETECTED Final   Klebsiella aerogenes NOT DETECTED NOT DETECTED Final   Klebsiella oxytoca NOT DETECTED NOT DETECTED Final   Klebsiella pneumoniae NOT DETECTED NOT DETECTED Final   Proteus species NOT DETECTED NOT DETECTED Final   Salmonella species NOT DETECTED NOT DETECTED Final   Serratia marcescens NOT DETECTED NOT DETECTED Final   Haemophilus  influenzae NOT DETECTED NOT DETECTED Final   Neisseria meningitidis NOT DETECTED NOT DETECTED Final   Pseudomonas aeruginosa NOT DETECTED NOT DETECTED Final   Stenotrophomonas maltophilia NOT DETECTED NOT DETECTED Final   Candida albicans NOT DETECTED NOT DETECTED Final   Candida auris NOT DETECTED NOT DETECTED Final   Candida glabrata NOT DETECTED NOT DETECTED Final   Candida krusei NOT DETECTED NOT DETECTED Final   Candida parapsilosis NOT DETECTED NOT DETECTED Final   Candida tropicalis NOT DETECTED NOT DETECTED Final   Cryptococcus neoformans/gattii NOT DETECTED NOT DETECTED Final   Meth resistant mecA/C and MREJ NOT DETECTED NOT DETECTED Final    Comment: Performed at [facility], [address], [place], [place] [address]  Culture, blood (x 2)     Status: Abnormal   Collection Time: 06/15/21  6:01 PM   Specimen: BLOOD  Result Value Ref Range Status   Specimen Description   Final    BLOOD BLOOD LEFT HAND Performed at [facility], [address], [place], [place] [address]    Special Requests   Final    BOTTLES DRAWN AEROBIC ONLY Blood Culture adequate volume Performed at [facility], [address] [place], [place] [address]    Culture  Setup Time   Final    GRAM POSITIVE COCCI IN CLUSTERS AEROBIC BOTTLE ONLY CRITICAL VALUE NOTED.  VALUE IS CONSISTENT WITH PREVIOUSLY REPORTED AND CALLED VALUE.    Culture (A)  Final    STAPHYLOCOCCUS AUREUS SUSCEPTIBILITIES PERFORMED ON PREVIOUS CULTURE WITHIN THE LAST 5 DAYS. Performed at [facility], [address], [place], [place] [address]    Report Status 06/18/2021 FINAL  Final  Culture, blood (routine x 2)     Status: None   Collection Time: 06/17/21  4:54 AM   Specimen: BLOOD RIGHT HAND  Result Value Ref Range Status  Specimen Description   Final    BLOOD RIGHT HAND Performed at [facility], [address], [place], [place] [address]    Special  Requests   Final    BOTTLES DRAWN AEROBIC ONLY Blood Culture results may not be optimal due to an excessive volume of blood received in culture bottles Performed at [facility], [address], [place], [place] [address]    Culture   Final    NO GROWTH 5 DAYS Performed at [facility], [address], [place], [place] [address]    Report Status 06/22/2021 FINAL  Final  Culture, blood (routine x 2)     Status: None   Collection Time: 06/17/21  4:55 AM   Specimen: BLOOD LEFT ARM  Result Value Ref Range Status   Specimen Description   Final    BLOOD LEFT ARM Performed at [facility], [address], [place], [place] [address]    Special Requests   Final    BOTTLES DRAWN AEROBIC ONLY Blood Culture adequate volume Performed at [facility], [address], [place], [place] [address]    Culture   Final    NO GROWTH 5 DAYS Performed at [facility], [address], [place], [place] [address]    Report Status 06/22/2021 FINAL  Final         Radiology Studies: ECHO TEE  Result Date: 06/22/2021    TRANSESOPHOGEAL ECHO REPORT   Patient Name:   VANDY TSUCHIYA [facility] Date of Exam: 06/22/2021 Medical Rec #:  [id number]          Height:       65.0 in Accession #:    [id number]         Weight:       179.9 lb Date of Birth:  January 05, 1961           BSA:          1.891 m Patient Age:    59 years           BP:           110/78 mmHg Patient Gender: M                  HR:           91 bpm. Exam Location:  Inpatient Procedure: Transesophageal Echo and Color Doppler Indications:     Bacteremia  History:         Patient has prior history of Echocardiogram examinations, most                  recent 06/18/2021. Risk Factors:ETOH.  Sonographer:     Eulah Pont RDCS Referring Phys:  [id number] Dorcas Carrow Diagnosing Phys: [name] MD PROCEDURE: After discussion of the risks and benefits of a TEE, an informed consent was obtained from the  patient. The transesophogeal probe was passed without difficulty through the esophogus of the patient. Sedation performed by different physician. The patient was monitored while under deep sedation. Anesthestetic sedation was provided intravenously by Anesthesiology: 230.76mg  of Propofol,  of Lidocaine. The patient developed no complications during the procedure. IMPRESSIONS  1. No vegetations.  2. Left ventricular ejection fraction, by estimation, is 55 to 60%. The left ventricle has normal function.  3. Right ventricular systolic function is normal. The right ventricular size is normal.  4. No left atrial/left atrial appendage thrombus was detected.  5. The mitral valve is normal  in structure. Mild mitral valve regurgitation.  6. The aortic valve is tricuspid. Aortic valve regurgitation is mild.  7. There is mild (Grade II) plaque involving the descending aorta. FINDINGS  Left Ventricle: Left ventricular ejection fraction, by estimation, is 55 to 60%. The left ventricle has normal function. The left ventricular internal cavity size was normal in size. Right Ventricle: The right ventricular size is normal.Right ventricular systolic function is normal. Left Atrium: Left atrial size was normal in size. No left atrial/left atrial appendage thrombus was detected. Right Atrium: Right atrial size was normal in size. Pericardium: There is no evidence of pericardial effusion. Mitral Valve: The mitral valve is normal in structure. Mild mitral valve regurgitation. Tricuspid Valve: The tricuspid valve is normal in structure. Tricuspid valve regurgitation is mild. Aortic Valve: The aortic valve is tricuspid. Aortic valve regurgitation is mild. Pulmonic Valve: The pulmonic valve was normal in structure. Pulmonic valve regurgitation is trivial. Aorta: The aortic root is normal in size and structure. There is mild (Grade II) plaque involving the descending aorta. IAS/Shunts: No atrial level shunt detected by color flow  Doppler. Additional Comments: No vegetations. [name] MD Electronically signed by [name] MD Signature Date/Time: 06/22/2021/1:33:54 PM    Final         Scheduled Meds:  feeding supplement  237 mL Oral BID BM   folic acid  1 mg Oral Daily   magnesium oxide  400 mg Oral BID   multivitamin with minerals  1 tablet Oral Daily   pneumococcal 23 valent vaccine  0.5 mL Intramuscular Tomorrow-1000   simethicone  80 mg Oral QID   thiamine  100 mg Oral Daily   Or   thiamine  100 mg Intravenous Daily   vitamin B-12  1,000 mcg Oral Daily   Continuous Infusions:  magnesium sulfate bolus IVPB 2 g (06/23/21 1241)     LOS: 8 days    Time spent: 30 minutes    Dorcas Carrow, MD [facility] Pager [phone]

## 2021-06-23 NOTE — Progress Notes (Signed)
Physical Therapy Treatment Patient Details Name: Joseph Bell MRN: [id number] DOB: 1960-12-22 Today's Date: 06/23/2021    History of Present Illness patient is a 60 yo male admitted with hyponatremia, AMS, weakness, falls, sepsis, rhabdomyolysis. RR called on 7/12. Hx of chronic L rib fxs, ETOH abuse    PT Comments    Pt assisted with ambulating in hallway short distance and requires min assist at this time for mobility.  Pt unsteady and presents as high fall risk.  Pt does not appear safe to d/c without 24/7 assist so continue to recommend SNF at this time.    Follow Up Recommendations  SNF     Equipment Recommendations  Rolling walker with 5" wheels    Recommendations for Other Services       Precautions / Restrictions Precautions Precautions: Fall Restrictions Weight Bearing Restrictions: No    Mobility  Bed Mobility Overal bed mobility: Needs Assistance Bed Mobility: Supine to Sit;Sit to Supine     Supine to sit: Supervision;HOB elevated     General bed mobility comments: increased time and effort    Transfers Overall transfer level: Needs assistance Equipment used: Rolling walker (2 wheeled) Transfers: Sit to/from Stand Sit to Stand: Min assist         General transfer comment: assist to rise and steady, cues for hand placement  Ambulation/Gait Ambulation/Gait assistance: Min assist Gait Distance (Feet): 50 Feet (x2) Assistive device: Rolling walker (2 wheeled) Gait Pattern/deviations: Step-through pattern;Decreased stride length;Wide base of support Gait velocity: decr   General Gait Details: verbal cues for posture and RW position, occasional steadying assist   Stairs             Wheelchair Mobility    Modified Rankin (Stroke Patients Only)       Balance Overall balance assessment: History of Falls         Standing balance support: Bilateral upper extremity supported Standing balance-Leahy Scale: Poor Standing balance  comment: use of external support                            Cognition Arousal/Alertness: Awake/alert Behavior During Therapy: WFL for tasks assessed/performed Overall Cognitive Status: No family/caregiver present to determine baseline cognitive functioning Area of Impairment: Safety/judgement;Problem solving                         Safety/Judgement: Decreased awareness of safety   Problem Solving: Requires verbal cues;Difficulty sequencing General Comments: requiring cues for safety      Exercises      General Comments        Pertinent Vitals/Pain Pain Assessment: Faces Faces Pain Scale: Hurts little more Pain Location: ribs Pain Descriptors / Indicators: Discomfort;Sore Pain Intervention(s): Repositioned;Monitored during session    Home Living                      Prior Function            PT Goals (current goals can now be found in the care plan section) Progress towards PT goals: Progressing toward goals    Frequency    Min 3X/week      PT Plan Current plan remains appropriate    Co-evaluation              AM-PAC PT "6 Clicks" Mobility   Outcome Measure  Help needed turning from your back to your side while in a flat bed  without using bedrails?: A Little Help needed moving from lying on your back to sitting on the side of a flat bed without using bedrails?: A Little Help needed moving to and from a bed to a chair (including a wheelchair)?: A Little Help needed standing up from a chair using your arms (e.g., wheelchair or bedside chair)?: A Little Help needed to walk in hospital room?: A Little Help needed climbing 3-5 steps with a railing? : A Lot 6 Click Score: 17    End of Session Equipment Utilized During Treatment: Gait belt Activity Tolerance: Patient tolerated treatment well Patient left: in bed;with call bell/phone within reach;with bed alarm set Nurse Communication: Mobility status PT Visit Diagnosis:  Muscle weakness (generalized) (M62.81);Other abnormalities of gait and mobility (R26.89);History of falling (Z91.81)     Time: 1350-1405 PT Time Calculation (min) (ACUTE ONLY): 15 min  Charges:  $Gait Training: 8-22 mins                     Thomasene Mohair PT, DPT Acute Rehabilitation Services Pager: [phone] Office: [phone]    Maida Sale E 06/23/2021, 2:12 PM

## 2021-06-23 NOTE — Progress Notes (Signed)
Nutrition Follow-up  INTERVENTION:   -Ensure Enlive po BID, each supplement provides 350 kcal and 20 grams of protein  NUTRITION DIAGNOSIS:   Increased nutrient needs related to acute illness as evidenced by estimated needs.  Ongoing.  GOAL:   Patient will meet greater than or equal to 90% of their needs  Progressing.  MONITOR:   PO intake, Supplement acceptance, Weight trends, I & O's, Labs  ASSESSMENT:   60 y.o. male with medical history significant of EtOH abuse. Presenting with altered mental status.  Patient has been consuming 25-100% of meals. Has been accepting Ensure supplements as well.   Admission weight: 179 lbs. Still no updated weight.  Per nursing documentation, pt with mild BLE edema.  I/Os: -6.7L since admit UOP:1L  Medications: Folic acid, MAG-OX, Multivitamin with minerals daily, Thiamine, Vitamin B-12, IV Mg sulfate  Labs reviewed: Low Na, Mg  Diet Order:   Diet Order             Diet regular Room service appropriate? Yes; Fluid consistency: Thin  Diet effective now                   EDUCATION NEEDS:   No education needs have been identified at this time  Skin:  Skin Assessment: Reviewed RN Assessment  Last BM:  PTA  Height:   Ht Readings from Last 1 Encounters:  06/15/21 5\' 5"  (1.651 m)    Weight:   Wt Readings from Last 1 Encounters:  06/15/21 81.6 kg    BMI:  Body mass index is 29.94 kg/m.  Estimated Nutritional Needs:   Kcal:  1900-2100  Protein:  85-95g  Fluid:  2L/day  08/16/21, MS, RD, LDN Inpatient Clinical Dietitian Contact information available via Amion

## 2021-06-23 NOTE — Progress Notes (Signed)
OT Cancellation Note  Patient Details Name: Joseph Bell MRN: [id number] DOB: [birth year]   Cancelled Treatment:    Reason Eval/Treat Not Completed: Patient declined, no reason specified. Patiemt seen after ambulating with PT. NO agreeable to any further activity.   Sharion Grieves L Bentley Fissel 06/23/2021, 2:08 PM

## 2021-06-24 LAB — MAGNESIUM: Magnesium: 1.8 mg/dL (ref 1.7–2.4)

## 2021-06-24 NOTE — Progress Notes (Signed)
PROGRESS NOTE    Joseph DaubWilliam T Bell  WUJ:[id number] DOB: 06/21/1961 DOA: 06/15/2021 PCP: [name], [name], NP    Brief Narrative:  60 year old gentleman who is homeless and continues to drink alcohol presented to the emergency room on 7/12 with generalized weakness.  He was found to have rhabdomyolysis, hyponatremia and ultimately found to have staph aureus bacteremia.  No primary source of infection.    Assessment & Plan:   Principal Problem:   MSSA bacteremia Active Problems:   Hyponatremia   Alcohol abuse  MSSA bacteremia: Blood culture 7/12 MSSA.  Blood cultures 7/14 no growth. TTE with no evidence of endocarditis. TEE with no evidence of vegetations. Primary source unknown.  Treated with Ancef for 1 week.   Patient was given Orbactiv 1200 mg once by infectious disease.  Completed antibiotic therapy.  Dilutional hyponatremia: Resolved with isotonic fluid and ultimately with regular diet.  Monitor.  Electrolytes are fairly normal.  We will keep on a scheduled magnesium replacement.  Alcoholism: So far no evidence of alcohol withdrawal.  Remains on symptomatic treatment with thiamine, B12 and multivitamins.  Patient has completed antibiotic therapy.  However remains debilitated, difficulty mobilizing.  He is working with PT OT and nursing staff every day to improve his mobility so that he can be discharged. Patient is homeless, he is trying to gather more support so that he can go home. If slight improvement on mobility, will discharge to shelter in the next few days.   DVT prophylaxis: SCDs Start: 06/15/21 1735   Code Status: Full code Family Communication: None Disposition Plan: Status is: Inpatient  Remains inpatient appropriate because:IV treatments appropriate due to intensity of illness or inability to take PO and Inpatient level of care appropriate due to severity of illness  Dispo: The patient is from:  [facility]               Anticipated d/c is to: Unknown.               Patient is clinically stable for discharge if he is safe to ambulate.   Difficult to place patient Yes         Consultants:  Infectious disease  Procedures:  None  Antimicrobials:  Ancef 7/13--- 7/19 7/20, Orbactiv 1200 mg once.   Subjective: Patient seen and examined.  He was walking with occupational therapist with the use of walker with some difficulties.  Limited due to pain from the rib fracture on the left side.  Remains afebrile.  Now with less cough.  Objective: Vitals:   06/23/21 0502 06/23/21 1325 06/23/21 2016 06/24/21 0457  BP: 110/65 110/74 117/76 (!) 114/91  Pulse: 97 89 93 91  Resp: 18 17 20 19   Temp: 98.2 F (36.8 C) 98.3 F (36.8 C) 99.2 F (37.3 C) 98.8 F (37.1 C)  TempSrc: Oral Oral    SpO2: 94% 96% 94% 91%  Weight:      Height:        Intake/Output Summary (Last 24 hours) at 06/24/2021 1420 Last data filed at 06/24/2021 1057 Gross per 24 hour  Intake 878 ml  Output 300 ml  Net 578 ml   Filed Weights   06/15/21 1131  Weight: 81.6 kg    Examination: General: Trying to walk in the hallway with mild discomfort.  Otherwise looks comfortable.  On room air. Cardiovascular: S1-S2 normal.  Regular rate rhythm. Respiratory: Bilateral clear.  Palpable tenderness along the left lateral chest wall. Gastrointestinal: Soft and nontender.   Data Reviewed:  I have personally reviewed following labs and imaging studies  CBC: Recent Labs  Lab 06/18/21 0447 06/19/21 0520 06/20/21 0549 06/21/21 0445 06/22/21 0455  WBC 14.1* 14.1* 13.4* 13.5* 15.6*  NEUTROABS  --  10.2*  --   --   --   HGB 12.8* 13.4 12.6* 13.3 12.5*  HCT 35.9* 38.6* 35.8* 39.0 36.7*  MCV 101.7* 101.8* 101.4* 104.0* 103.4*  PLT 136* 188 212 245 262   Basic Metabolic Panel: Recent Labs  Lab 06/18/21 0447 06/18/21 1430 06/19/21 0520 06/20/21 0549 06/21/21 0445 06/22/21 0455 06/23/21 0433 06/24/21 0544  NA 131*  --  132* 128* 129* 127*  --   --   K 2.9*  --  3.0*  3.0* 3.9 3.7  --   --   CL 92*  --  89* 92* 94* 92*  --   --   CO2 32  --  32 26 28 23   --   --   GLUCOSE 110*  --  105* 108* 106* 100*  --   --   BUN <5*  --  6 7 8 6   --   --   CREATININE 0.33*  --  0.43* 0.48* 0.48* 0.39*  --   --   CALCIUM 8.4*  --  9.1 8.5* 8.7* 8.3*  --   --   MG  --    < > 1.3* 1.3* 1.6* 1.6* 1.6* 1.8   < > = values in this interval not displayed.   GFR: Estimated Creatinine Clearance: 96.5 mL/min (A) (by C-G formula based on SCr of 0.39 mg/dL (L)). Liver Function Tests: Recent Labs  Lab 06/18/21 0447 06/19/21 0520 06/20/21 0549 06/21/21 0445  AST 67* 39 31 24  ALT 42 31 23 19   ALKPHOS 44 57 61 52  BILITOT 0.9 1.1 1.2 1.1  PROT 5.1* 5.7* 5.5* 6.0*  ALBUMIN 2.2* 2.4* 2.2* 2.3*   No results for input(s): LIPASE, AMYLASE in the last 168 hours. No results for input(s): AMMONIA in the last 168 hours. Coagulation Profile: No results for input(s): INR, PROTIME in the last 168 hours.  Cardiac Enzymes: Recent Labs  Lab 06/18/21 0447  CKTOTAL 179   BNP (last 3 results) No results for input(s): PROBNP in the last 8760 hours. HbA1C: No results for input(s): HGBA1C in the last 72 hours. CBG: No results for input(s): GLUCAP in the last 168 hours. Lipid Profile: No results for input(s): CHOL, HDL, LDLCALC, TRIG, CHOLHDL, LDLDIRECT in the last 72 hours. Thyroid Function Tests: No results for input(s): TSH, T4TOTAL, FREET4, T3FREE, THYROIDAB in the last 72 hours. Anemia Panel: No results for input(s): VITAMINB12, FOLATE, FERRITIN, TIBC, IRON, RETICCTPCT in the last 72 hours. Sepsis Labs: No results for input(s): PROCALCITON, LATICACIDVEN in the last 168 hours.   Recent Results (from the past 240 hour(s))  Resp Panel by RT-PCR (Flu A&B, Covid) Nasopharyngeal Swab     Status: None   Collection Time: 06/15/21  1:47 PM   Specimen: Nasopharyngeal Swab; Nasopharyngeal(NP) swabs in vial transport medium  Result Value Ref Range Status   SARS Coronavirus 2 by  RT PCR NEGATIVE NEGATIVE Final    Comment: (NOTE) SARS-CoV-2 target nucleic acids are NOT DETECTED.  The SARS-CoV-2 RNA is generally detectable in upper respiratory specimens during the acute phase of infection. The lowest concentration of SARS-CoV-2 viral copies this assay can detect is 138 copies/mL. A negative result does not preclude SARS-Cov-2 infection and should not be used as the sole basis for treatment or  other patient management decisions. A negative result may occur with  improper specimen collection/handling, submission of specimen other than nasopharyngeal swab, presence of viral mutation(s) within the areas targeted by this assay, and inadequate number of viral copies(<138 copies/mL). A negative result must be combined with clinical observations, patient history, and epidemiological information. The expected result is Negative.  Fact Sheet for Patients:  BloggerCourse.com  Fact Sheet for Healthcare Providers:  SeriousBroker.it  This test is no t yet approved or cleared by the [place] FDA and  has been authorized for detection and/or diagnosis of SARS-CoV-2 by FDA under an Emergency Use Authorization (EUA). This EUA will remain  in effect (meaning this test can be used) for the duration of the COVID-19 declaration under Section 564(b)(1) of the Act, 21 U.S.C.section 360bbb-3(b)(1), unless the authorization is terminated  or revoked sooner.       Influenza A by PCR NEGATIVE NEGATIVE Final   Influenza B by PCR NEGATIVE NEGATIVE Final    Comment: (NOTE) The Xpert Xpress SARS-CoV-2/FLU/RSV plus assay is intended as an aid in the diagnosis of influenza from Nasopharyngeal swab specimens and should not be used as a sole basis for treatment. Nasal washings and aspirates are unacceptable for Xpert Xpress SARS-CoV-2/FLU/RSV testing.  Fact Sheet for Patients: BloggerCourse.com  Fact Sheet  for Healthcare Providers: SeriousBroker.it  This test is not yet approved or cleared by the [place] FDA and has been authorized for detection and/or diagnosis of SARS-CoV-2 by FDA under an Emergency Use Authorization (EUA). This EUA will remain in effect (meaning this test can be used) for the duration of the COVID-19 declaration under Section 564(b)(1) of the Act, 21 U.S.C. section 360bbb-3(b)(1), unless the authorization is terminated or revoked.  Performed at [facility], [address] [place], [place] [address]   Culture, blood (x 2)     Status: Abnormal   Collection Time: 06/15/21  5:56 PM   Specimen: BLOOD  Result Value Ref Range Status   Specimen Description   Final    BLOOD RIGHT ANTECUBITAL Performed at [facility], [address] [place], [place] [address]    Special Requests   Final    BOTTLES DRAWN AEROBIC AND ANAEROBIC Blood Culture adequate volume Performed at [facility], [address] [place], [place] [address]    Culture  Setup Time   Final    GRAM POSITIVE COCCI IN CLUSTERS IN BOTH AEROBIC AND ANAEROBIC BOTTLES CRITICAL RESULT CALLED TO, READ BACK BY AND VERIFIED WITH: Thana Ates [id number] AT 1226 BY CM Performed at [facility], [address] [place], [place] [address]    Culture STAPHYLOCOCCUS AUREUS (A)  Final   Report Status 06/18/2021 FINAL  Final   Organism ID, Bacteria STAPHYLOCOCCUS AUREUS  Final      Susceptibility   Staphylococcus aureus - MIC*    CIPROFLOXACIN >=8 RESISTANT Resistant     ERYTHROMYCIN <=0.25 SENSITIVE Sensitive     GENTAMICIN <=0.5 SENSITIVE Sensitive     OXACILLIN <=0.25 SENSITIVE Sensitive     TETRACYCLINE <=1 SENSITIVE Sensitive     VANCOMYCIN 1 SENSITIVE Sensitive     TRIMETH/SULFA <=10 SENSITIVE Sensitive     CLINDAMYCIN <=0.25 SENSITIVE Sensitive     RIFAMPIN <=0.5 SENSITIVE Sensitive     Inducible Clindamycin  NEGATIVE Sensitive     * STAPHYLOCOCCUS AUREUS  Blood Culture ID Panel (Reflexed)     Status: Abnormal   Collection Time: 06/15/21  5:56 PM  Result Value Ref Range Status   Enterococcus faecalis NOT DETECTED NOT DETECTED Final   Enterococcus Faecium NOT DETECTED NOT DETECTED Final   Listeria monocytogenes NOT DETECTED NOT DETECTED Final   Staphylococcus species DETECTED (A) NOT DETECTED Final    Comment: CRITICAL RESULT CALLED TO, READ BACK BY AND VERIFIED WITH: PHARMD J GADHIA [id number] AT 1227 BY CM    Staphylococcus aureus (BCID) DETECTED (A) NOT DETECTED Final    Comment: CRITICAL RESULT CALLED TO, READ BACK BY AND VERIFIED WITH: PHARMD J GADHIA [id number] AT 1226 BY CM    Staphylococcus epidermidis NOT DETECTED NOT DETECTED Final   Staphylococcus lugdunensis NOT DETECTED NOT DETECTED Final   Streptococcus species NOT DETECTED NOT DETECTED Final   Streptococcus agalactiae NOT DETECTED NOT DETECTED Final   Streptococcus pneumoniae NOT DETECTED NOT DETECTED Final   Streptococcus pyogenes NOT DETECTED NOT DETECTED Final   A.calcoaceticus-baumannii NOT DETECTED NOT DETECTED Final   Bacteroides fragilis NOT DETECTED NOT DETECTED Final   Enterobacterales NOT DETECTED NOT DETECTED Final   Enterobacter cloacae complex NOT DETECTED NOT DETECTED Final   Escherichia coli NOT DETECTED NOT DETECTED Final   Klebsiella aerogenes NOT DETECTED NOT DETECTED Final   Klebsiella oxytoca NOT DETECTED NOT DETECTED Final   Klebsiella pneumoniae NOT DETECTED NOT DETECTED Final   Proteus species NOT DETECTED NOT DETECTED Final   Salmonella species NOT DETECTED NOT DETECTED Final   Serratia marcescens NOT DETECTED NOT DETECTED Final   Haemophilus influenzae NOT DETECTED NOT DETECTED Final   Neisseria meningitidis NOT DETECTED NOT DETECTED Final   Pseudomonas aeruginosa NOT DETECTED NOT DETECTED Final   Stenotrophomonas maltophilia NOT DETECTED NOT DETECTED Final   Candida albicans NOT DETECTED NOT DETECTED  Final   Candida auris NOT DETECTED NOT DETECTED Final   Candida glabrata NOT DETECTED NOT DETECTED Final   Candida krusei NOT DETECTED NOT DETECTED Final   Candida parapsilosis NOT DETECTED NOT DETECTED Final   Candida tropicalis NOT DETECTED NOT DETECTED Final   Cryptococcus neoformans/gattii NOT DETECTED NOT DETECTED Final   Meth resistant mecA/C and MREJ NOT DETECTED NOT DETECTED Final    Comment: Performed at [facility], [address], [place], [place] [address]  Culture, blood (x 2)     Status: Abnormal   Collection Time: 06/15/21  6:01 PM   Specimen: BLOOD  Result Value Ref Range Status   Specimen Description   Final    BLOOD BLOOD LEFT HAND Performed at [facility], [address], [place], [place] [address]    Special Requests   Final    BOTTLES DRAWN AEROBIC ONLY Blood Culture adequate volume Performed at [facility], [address], [place], [place] [address]    Culture  Setup Time   Final    GRAM POSITIVE COCCI IN CLUSTERS AEROBIC BOTTLE ONLY CRITICAL VALUE NOTED.  VALUE IS CONSISTENT WITH PREVIOUSLY REPORTED AND CALLED VALUE.    Culture (A)  Final    STAPHYLOCOCCUS AUREUS SUSCEPTIBILITIES PERFORMED ON PREVIOUS CULTURE WITHIN THE LAST 5 DAYS. Performed at [facility], [address] [place], [place] [address]    Report Status 06/18/2021 FINAL  Final  Culture, blood (routine x 2)     Status: None   Collection Time: 06/17/21  4:54 AM   Specimen: BLOOD RIGHT HAND  Result Value Ref Range Status   Specimen Description   Final    BLOOD RIGHT HAND Performed at [facility], [address] [place],  [place] [address]    Special Requests   Final    BOTTLES DRAWN AEROBIC ONLY Blood Culture results may not be optimal due to an excessive volume of blood received in culture bottles Performed at [facility], [address], [place], [place] [address]    Culture   Final    NO  GROWTH 5 DAYS Performed at [facility], [address], [place], [place] [address]    Report Status 06/22/2021 FINAL  Final  Culture, blood (routine x 2)     Status: None   Collection Time: 06/17/21  4:55 AM   Specimen: BLOOD LEFT ARM  Result Value Ref Range Status   Specimen Description   Final    BLOOD LEFT ARM Performed at [facility], [address], [place], [place] [address]    Special Requests   Final    BOTTLES DRAWN AEROBIC ONLY Blood Culture adequate volume Performed at [facility], [address] [place], [place] [address]    Culture   Final    NO GROWTH 5 DAYS Performed at [facility], [address], [place], [place] [address]    Report Status 06/22/2021 FINAL  Final         Radiology Studies: No results found.      Scheduled Meds:  feeding supplement  237 mL Oral BID BM   folic acid  1 mg Oral Daily   magnesium oxide  400 mg Oral BID   multivitamin with minerals  1 tablet Oral Daily   pneumococcal 23 valent vaccine  0.5 mL Intramuscular Tomorrow-1000   simethicone  80 mg Oral QID   thiamine  100 mg Oral Daily   Or   thiamine  100 mg Intravenous Daily   vitamin B-12  1,000 mcg Oral Daily   Continuous Infusions:     LOS: 9 days    Time spent: 25 minutes    Dorcas Carrow, MD [facility] Pager [phone]

## 2021-06-24 NOTE — Progress Notes (Signed)
Occupational Therapy Treatment Patient Details Name: Joseph Bell MRN: [id number] DOB: February 27, 1961 Today's Date: 06/24/2021    History of present illness patient is a 60 yo male admitted with hyponatremia, AMS, weakness, falls, sepsis, rhabdomyolysis. RR called on 7/12. Hx of chronic L rib fxs, ETOH abuse   OT comments  Pr progressing towards OT goals (met and updated accordingly). Pt received standing at bedside with RN ready to mobilize. Pt able to demo hallway mobility using RW at min guard without LOB or physical assist needed. Pt was noted with dyspnea and need for intermittent standing rest breaks. Educated on energy conservation strategies with continued reinforcement needed. Pt reports increased rib pain when bending during LB ADLs but able to demo ability to reach feet. Plan to further assess LB ADLs in next session. DME recs updated to include rolling walker to maximize safety and decrease fall risk.   SpO2 89% with activity, 3/4 DOE. Increases quickly to 92% with pursed lip breathing and seated/standing rest breaks.    Follow Up Recommendations  No OT follow up    Equipment Recommendations  Other (comment) (Rolling walker)    Recommendations for Other Services      Precautions / Restrictions Precautions Precautions: Fall;Other (comment) Precaution Comments: monitor O2 Restrictions Weight Bearing Restrictions: No       Mobility Bed Mobility               General bed mobility comments: received standing at bedside with RN    Transfers Overall transfer level: Needs assistance Equipment used: Rolling walker (2 wheeled) Transfers: Sit to/from Stand Sit to Stand: Supervision         General transfer comment: no assist needed to stand    Balance Overall balance assessment: History of Falls Sitting-balance support: No upper extremity supported;Feet supported Sitting balance-Leahy Scale: Good     Standing balance support: Single extremity  supported;Bilateral upper extremity supported;During functional activity Standing balance-Leahy Scale: Fair Standing balance comment: fair static standing, benefits from BUE support with mobility                           ADL either performed or assessed with clinical judgement   ADL Overall ADL's : Needs assistance/impaired                 Upper Body Dressing : Set up;Standing Upper Body Dressing Details (indicate cue type and reason): Setup to don gown around back   Lower Body Dressing Details (indicate cue type and reason): Pt able to demo ability to reach down to shoes but reports pain with bending due to rib fx. Pt reports unable to form figure four position - but also declined to show therapist             Functional mobility during ADLs: Min guard;Rolling walker General ADL Comments: Session focused on progression of endurance, education of energy conservation strategies during daily tasks due to increased WOB with prolonged activity.     Vision   Vision Assessment?: No apparent visual deficits   Perception     Praxis      Cognition Arousal/Alertness: Awake/alert Behavior During Therapy: WFL for tasks assessed/performed Overall Cognitive Status: No family/caregiver present to determine baseline cognitive functioning Area of Impairment: Safety/judgement;Problem solving                         Safety/Judgement: Decreased awareness of safety   Problem Solving: Requires verbal  cues General Comments: minor cues for safety/pacing        Exercises     Shoulder Instructions       General Comments 3/4 DOE, SpO2 89% on RA after long distance mobility. With cues for pursed lip breathing and/or rest break, increases quickly to 92%. MD observed pt in hallway during task and aware of O2 - reports this is likely due to avoidance of rib fx pain with deep breaths    Pertinent Vitals/ Pain       Pain Assessment: Faces Faces Pain Scale: Hurts a  little bit Pain Location: ribs Pain Descriptors / Indicators: Discomfort;Sore Pain Intervention(s): Monitored during session  Home Living                                          Prior Functioning/Environment              Frequency  Min 2X/week        Progress Toward Goals  OT Goals(current goals can now be found in the care plan section)  Progress towards OT goals: Progressing toward goals  Acute Rehab OT Goals Patient Stated Goal: less pain. to find somewhere to go OT Goal Formulation: With patient Time For Goal Achievement: 07/01/21 Potential to Achieve Goals: Good ADL Goals Pt Will Perform Lower Body Dressing: with modified independence;sit to/from stand Pt Will Transfer to Toilet: with modified independence Pt Will Perform Toileting - Clothing Manipulation and hygiene: with modified independence Additional ADL Goal #1: Pt to demonstrate at least 2 energy conservation strategies during ADLs/mobility without verbal cues  Plan Discharge plan remains appropriate    Co-evaluation                 AM-PAC OT "6 Clicks" Daily Activity     Outcome Measure   Help from another person eating meals?: None Help from another person taking care of personal grooming?: A Little Help from another person toileting, which includes using toliet, bedpan, or urinal?: A Little Help from another person bathing (including washing, rinsing, drying)?: A Little Help from another person to put on and taking off regular upper body clothing?: A Little Help from another person to put on and taking off regular lower body clothing?: A Little 6 Click Score: 19    End of Session Equipment Utilized During Treatment: Gait belt;Rolling walker  OT Visit Diagnosis: Unsteadiness on feet (R26.81);Muscle weakness (generalized) (M62.81)   Activity Tolerance Patient tolerated treatment well   Patient Left in chair;with call bell/phone within reach;with chair alarm set    Nurse Communication Mobility status;Other (comment) (O2)        Time: 1000-1017 OT Time Calculation (min): 17 min  Charges: OT General Charges $OT Visit: 1 Visit OT Treatments $Therapeutic Activity: 8-22 mins  Malachy Chamber, OTR/L Acute Rehab Services Office: [phone]    Layla Maw 06/24/2021, 11:00 AM

## 2021-06-24 NOTE — TOC Progression Note (Signed)
Transition of Care [facility]) - Progression Note    Patient Details  Name: EHAN FREAS MRN: [id number] Date of Birth: 04-21-1961  Transition of Care [facility]) CM/SW Contact  Ida Rogue, [place] Phone Number: 06/24/2021, 2:07 PM  Clinical Narrative:   Spoke with patient about dispositional plan.  He outlined several people he is waiting to hear back from, including pastor and friend.  I gave him list of shelters, encouraged him to call as a plan B.  He voiced understanding.  He wondered about a ride at d/c, and I let him know we can help him get to his next destination if he has no one to pick him up. TOC will continue to follow during the course of hospitalization.     Expected Discharge Plan: Home/Self Care Barriers to Discharge: No Barriers Identified  Expected Discharge Plan and Services Expected Discharge Plan: Home/Self Care In-house Referral: Clinical Social Work     Living arrangements for the past 2 months: No permanent address                                       Social Determinants of Health (SDOH) Interventions    Readmission Risk Interventions No flowsheet data found.

## 2021-06-24 NOTE — Progress Notes (Signed)
Mobility Specialist - Progress Note    06/24/21 1200  Mobility  Activity Ambulated in hall  Level of Assistance Contact guard assist, steadying assist  Assistive Device Front wheel walker  Distance Ambulated (ft) 400 ft  Mobility Ambulated with assistance in hallway  Mobility Response Tolerated well  Mobility performed by Mobility specialist  $Mobility charge 1 Mobility    Pt very eager to walk multiple times a day. Pt ambulated ~400 ft in hallway using RW. Pt c/o of abdominal pain due to his broken ribs, but tolerated session well. Pt required 3 standing rest breaks to catch his breath. Pt returned to recliner after ambulation with call bell at side and is eager to walk again. Will check in with pt daily as schedule permits.   Arliss Journey Mobility Specialist Acute Rehabilitation Services Phone: [phone] 06/24/21, 12:11 PM

## 2021-06-25 LAB — MAGNESIUM: Magnesium: 1.8 mg/dL (ref 1.7–2.4)

## 2021-06-25 MED ORDER — COLCHICINE 0.6 MG PO TABS
0.6000 mg | ORAL_TABLET | Freq: Every day | ORAL | Status: DC
  Administered 2021-06-25 – 2021-06-26 (×2): 0.6 mg via ORAL
  Filled 2021-06-25 (×2): qty 1

## 2021-06-25 MED ORDER — LIDOCAINE 5 % EX PTCH
1.0000 | MEDICATED_PATCH | CUTANEOUS | Status: DC
  Administered 2021-06-25 – 2021-06-26 (×2): 1 via TRANSDERMAL
  Filled 2021-06-25 (×2): qty 1

## 2021-06-25 NOTE — Progress Notes (Signed)
PROGRESS NOTE    Joseph DaubWilliam T Bell  XLK:[id number] DOB: 05/23/1961 DOA: 06/15/2021 PCP: [name], [name], NP    Brief Narrative:  60 year old gentleman who is homeless and continues to drink alcohol presented to the emergency room on 7/12 with generalized weakness.  He was found to have rhabdomyolysis, hyponatremia and ultimately found to have staph aureus bacteremia.  No primary source of infection.    Assessment & Plan:   Principal Problem:   MSSA bacteremia Active Problems:   Hyponatremia   Alcohol abuse  MSSA bacteremia: Blood culture 7/12 MSSA.  Blood cultures 7/14 no growth. TTE with no evidence of endocarditis. TEE with no evidence of vegetations. Primary source unknown.  Treated with Ancef for 1 week.   Patient was given Orbactiv 1200 mg once by infectious disease.  Completed antibiotic therapy.  Dilutional hyponatremia: Resolved with isotonic fluid and ultimately with regular diet.  Monitor.  Electrolytes are fairly normal.  We will keep on a scheduled magnesium replacement.  Alcoholism: So far no evidence of alcohol withdrawal.  Remains on symptomatic treatment with thiamine, B12 and multivitamins.  Acute gout flare: We will prescribe colchicine.  Patient has completed antibiotic therapy.  However remains debilitated, difficulty mobilizing.  He is working with PT OT and nursing staff every day to improve his mobility so that he can be discharged. Patient is homeless, he is trying to gather more support so that he can go home. If slight improvement on mobility, will discharge to shelter in the next few days.  Local lidocaine.  Continue working with mobility specialist in the hospital with hope to discharge with friend or to shelter in next few days.   DVT prophylaxis: SCDs Start: 06/15/21 1735   Code Status: Full code Family Communication: None Disposition Plan: Status is: Inpatient  Remains inpatient appropriate because:IV treatments appropriate due to  intensity of illness or inability to take PO and Inpatient level of care appropriate due to severity of illness  Dispo: The patient is from:  [place]               Anticipated d/c is to: Unknown.              Patient is clinically stable for discharge if he is safe to ambulate.   Difficult to place patient Yes         Consultants:  Infectious disease  Procedures:  None  Antimicrobials:  Ancef 7/13--- 7/19 7/20, Orbactiv 1200 mg once.   Subjective: Patient seen and examined.  No overnight events.  Complains of left toe pain and swelling and gout flare.  Left side of the chest wall hurts on mobility.  He is working with bedside nursing every day and therapist every day so that he can go home.  Objective: Vitals:   06/24/21 1503 06/24/21 2040 06/25/21 0559 06/25/21 1200  BP: 119/81 132/84 117/69   Pulse: 93 93 89   Resp: 20 20 18    Temp: 98.1 F (36.7 C) 98.4 F (36.9 C) 98.6 F (37 C)   TempSrc: Oral Oral Oral   SpO2: 98% 94% 93% 93%  Weight:      Height:        Intake/Output Summary (Last 24 hours) at 06/25/2021 1324 Last data filed at 06/25/2021 0900 Gross per 24 hour  Intake 336 ml  Output 2050 ml  Net -1714 ml   Filed Weights   06/15/21 1131  Weight: 81.6 kg    Examination: General: Trying to walk in the hallway with  mild discomfort.  Otherwise looks comfortable.  On room air. Cardiovascular: S1-S2 normal.  Regular rate rhythm. Respiratory: Bilateral clear.  Palpable tenderness along the left lateral chest wall. Gastrointestinal: Soft and nontender.   Data Reviewed: I have personally reviewed following labs and imaging studies  CBC: Recent Labs  Lab 06/19/21 0520 06/20/21 0549 06/21/21 0445 06/22/21 0455  WBC 14.1* 13.4* 13.5* 15.6*  NEUTROABS 10.2*  --   --   --   HGB 13.4 12.6* 13.3 12.5*  HCT 38.6* 35.8* 39.0 36.7*  MCV 101.8* 101.4* 104.0* 103.4*  PLT 188 212 245 262   Basic Metabolic Panel: Recent Labs  Lab 06/19/21 0520  06/20/21 0549 06/21/21 0445 06/22/21 0455 06/23/21 0433 06/24/21 0544 06/25/21 0445  NA 132* 128* 129* 127*  --   --   --   K 3.0* 3.0* 3.9 3.7  --   --   --   CL 89* 92* 94* 92*  --   --   --   CO2 32 --   --   --   GLUCOSE 105* 108* 106* 100*  --   --   --   BUN --   --   --   CREATININE 0.43* 0.48* 0.48* 0.39*  --   --   --   CALCIUM 9.1 8.5* 8.7* 8.3*  --   --   --   MG 1.3* 1.3* 1.6* 1.6* 1.6* 1.8 1.8   GFR: Estimated Creatinine Clearance: 96.5 mL/min (A) (by C-G formula based on SCr of 0.39 mg/dL (L)). Liver Function Tests: Recent Labs  Lab 06/19/21 0520 06/20/21 0549 06/21/21 0445  AST 39 31 24  ALT ALKPHOS 57 61 52  BILITOT 1.1 1.2 1.1  PROT 5.7* 5.5* 6.0*  ALBUMIN 2.4* 2.2* 2.3*   No results for input(s): LIPASE, AMYLASE in the last 168 hours. No results for input(s): AMMONIA in the last 168 hours. Coagulation Profile: No results for input(s): INR, PROTIME in the last 168 hours.  Cardiac Enzymes: No results for input(s): CKTOTAL, CKMB, CKMBINDEX, TROPONINI in the last 168 hours.  BNP (last 3 results) No results for input(s): PROBNP in the last 8760 hours. HbA1C: No results for input(s): HGBA1C in the last 72 hours. CBG: No results for input(s): GLUCAP in the last 168 hours. Lipid Profile: No results for input(s): CHOL, HDL, LDLCALC, TRIG, CHOLHDL, LDLDIRECT in the last 72 hours. Thyroid Function Tests: No results for input(s): TSH, T4TOTAL, FREET4, T3FREE, THYROIDAB in the last 72 hours. Anemia Panel: No results for input(s): VITAMINB12, FOLATE, FERRITIN, TIBC, IRON, RETICCTPCT in the last 72 hours. Sepsis Labs: No results for input(s): PROCALCITON, LATICACIDVEN in the last 168 hours.   Recent Results (from the past 240 hour(s))  Resp Panel by RT-PCR (Flu A&B, Covid) Nasopharyngeal Swab     Status: None   Collection Time: 06/15/21  1:47 PM   Specimen: Nasopharyngeal Swab; Nasopharyngeal(NP) swabs in vial transport medium   Result Value Ref Range Status   SARS Coronavirus 2 by RT PCR NEGATIVE NEGATIVE Final    Comment: (NOTE) SARS-CoV-2 target nucleic acids are NOT DETECTED.  The SARS-CoV-2 RNA is generally detectable in upper respiratory specimens during the acute phase of infection. The lowest concentration of SARS-CoV-2 viral copies this assay can detect is 138 copies/mL. A negative result does not preclude SARS-Cov-2 infection and should not be used as the sole basis for treatment or other patient management decisions. A  negative result may occur with  improper specimen collection/handling, submission of specimen other than nasopharyngeal swab, presence of viral mutation(s) within the areas targeted by this assay, and inadequate number of viral copies(<138 copies/mL). A negative result must be combined with clinical observations, patient history, and epidemiological information. The expected result is Negative.  Fact Sheet for Patients:  BloggerCourse.com  Fact Sheet for Healthcare Providers:  SeriousBroker.it  This test is no t yet approved or cleared by the [place] FDA and  has been authorized for detection and/or diagnosis of SARS-CoV-2 by FDA under an Emergency Use Authorization (EUA). This EUA will remain  in effect (meaning this test can be used) for the duration of the COVID-19 declaration under Section 564(b)(1) of the Act, 21 U.S.C.section 360bbb-3(b)(1), unless the authorization is terminated  or revoked sooner.       Influenza A by PCR NEGATIVE NEGATIVE Final   Influenza B by PCR NEGATIVE NEGATIVE Final    Comment: (NOTE) The Xpert Xpress SARS-CoV-2/FLU/RSV plus assay is intended as an aid in the diagnosis of influenza from Nasopharyngeal swab specimens and should not be used as a sole basis for treatment. Nasal washings and aspirates are unacceptable for Xpert Xpress SARS-CoV-2/FLU/RSV testing.  Fact Sheet for  Patients: BloggerCourse.com  Fact Sheet for Healthcare Providers: SeriousBroker.it  This test is not yet approved or cleared by the [place] FDA and has been authorized for detection and/or diagnosis of SARS-CoV-2 by FDA under an Emergency Use Authorization (EUA). This EUA will remain in effect (meaning this test can be used) for the duration of the COVID-19 declaration under Section 564(b)(1) of the Act, 21 U.S.C. section 360bbb-3(b)(1), unless the authorization is terminated or revoked.  Performed at [facility], [address], [place], [place] [address]   Culture, blood (x 2)     Status: Abnormal   Collection Time: 06/15/21  5:56 PM   Specimen: BLOOD  Result Value Ref Range Status   Specimen Description   Final    BLOOD RIGHT ANTECUBITAL Performed at [facility], [address] [place], [place] [address]    Special Requests   Final    BOTTLES DRAWN AEROBIC AND ANAEROBIC Blood Culture adequate volume Performed at [facility], [address] [place], [place] [address]    Culture  Setup Time   Final    GRAM POSITIVE COCCI IN CLUSTERS IN BOTH AEROBIC AND ANAEROBIC BOTTLES CRITICAL RESULT CALLED TO, READ BACK BY AND VERIFIED WITH: Thana Ates [id number] AT 1226 BY CM Performed at [facility], [address] [place], [place] [address]    Culture STAPHYLOCOCCUS AUREUS (A)  Final   Report Status 06/18/2021 FINAL  Final   Organism ID, Bacteria STAPHYLOCOCCUS AUREUS  Final      Susceptibility   Staphylococcus aureus - MIC*    CIPROFLOXACIN >=8 RESISTANT Resistant     ERYTHROMYCIN <=0.25 SENSITIVE Sensitive     GENTAMICIN <=0.5 SENSITIVE Sensitive     OXACILLIN <=0.25 SENSITIVE Sensitive     TETRACYCLINE <=1 SENSITIVE Sensitive     VANCOMYCIN 1 SENSITIVE Sensitive     TRIMETH/SULFA <=10 SENSITIVE Sensitive     CLINDAMYCIN <=0.25 SENSITIVE Sensitive      RIFAMPIN <=0.5 SENSITIVE Sensitive     Inducible Clindamycin NEGATIVE Sensitive     * STAPHYLOCOCCUS AUREUS  Blood Culture ID Panel (Reflexed)     Status: Abnormal   Collection Time: 06/15/21  5:56 PM  Result Value Ref Range Status  Enterococcus faecalis NOT DETECTED NOT DETECTED Final   Enterococcus Faecium NOT DETECTED NOT DETECTED Final   Listeria monocytogenes NOT DETECTED NOT DETECTED Final   Staphylococcus species DETECTED (A) NOT DETECTED Final    Comment: CRITICAL RESULT CALLED TO, READ BACK BY AND VERIFIED WITH: PHARMD J GADHIA [id number] AT 1227 BY CM    Staphylococcus aureus (BCID) DETECTED (A) NOT DETECTED Final    Comment: CRITICAL RESULT CALLED TO, READ BACK BY AND VERIFIED WITH: PHARMD J GADHIA [id number] AT 1226 BY CM    Staphylococcus epidermidis NOT DETECTED NOT DETECTED Final   Staphylococcus lugdunensis NOT DETECTED NOT DETECTED Final   Streptococcus species NOT DETECTED NOT DETECTED Final   Streptococcus agalactiae NOT DETECTED NOT DETECTED Final   Streptococcus pneumoniae NOT DETECTED NOT DETECTED Final   Streptococcus pyogenes NOT DETECTED NOT DETECTED Final   A.calcoaceticus-baumannii NOT DETECTED NOT DETECTED Final   Bacteroides fragilis NOT DETECTED NOT DETECTED Final   Enterobacterales NOT DETECTED NOT DETECTED Final   Enterobacter cloacae complex NOT DETECTED NOT DETECTED Final   Escherichia coli NOT DETECTED NOT DETECTED Final   Klebsiella aerogenes NOT DETECTED NOT DETECTED Final   Klebsiella oxytoca NOT DETECTED NOT DETECTED Final   Klebsiella pneumoniae NOT DETECTED NOT DETECTED Final   Proteus species NOT DETECTED NOT DETECTED Final   Salmonella species NOT DETECTED NOT DETECTED Final   Serratia marcescens NOT DETECTED NOT DETECTED Final   Haemophilus influenzae NOT DETECTED NOT DETECTED Final   Neisseria meningitidis NOT DETECTED NOT DETECTED Final   Pseudomonas aeruginosa NOT DETECTED NOT DETECTED Final   Stenotrophomonas maltophilia NOT DETECTED NOT  DETECTED Final   Candida albicans NOT DETECTED NOT DETECTED Final   Candida auris NOT DETECTED NOT DETECTED Final   Candida glabrata NOT DETECTED NOT DETECTED Final   Candida krusei NOT DETECTED NOT DETECTED Final   Candida parapsilosis NOT DETECTED NOT DETECTED Final   Candida tropicalis NOT DETECTED NOT DETECTED Final   Cryptococcus neoformans/gattii NOT DETECTED NOT DETECTED Final   Meth resistant mecA/C and MREJ NOT DETECTED NOT DETECTED Final    Comment: Performed at [facility], [address] [place], [place] [address]  Culture, blood (x 2)     Status: Abnormal   Collection Time: 06/15/21  6:01 PM   Specimen: BLOOD  Result Value Ref Range Status   Specimen Description   Final    BLOOD BLOOD LEFT HAND Performed at [facility], [address] [place], [place] [address]    Special Requests   Final    BOTTLES DRAWN AEROBIC ONLY Blood Culture adequate volume Performed at [facility], [address] [place], [place] [address]    Culture  Setup Time   Final    GRAM POSITIVE COCCI IN CLUSTERS AEROBIC BOTTLE ONLY CRITICAL VALUE NOTED.  VALUE IS CONSISTENT WITH PREVIOUSLY REPORTED AND CALLED VALUE.    Culture (A)  Final    STAPHYLOCOCCUS AUREUS SUSCEPTIBILITIES PERFORMED ON PREVIOUS CULTURE WITHIN THE LAST 5 DAYS. Performed at [facility], [address], [place], [place] [address]    Report Status 06/18/2021 FINAL  Final  Culture, blood (routine x 2)     Status: None   Collection Time: 06/17/21  4:54 AM   Specimen: BLOOD RIGHT HAND  Result Value Ref Range Status   Specimen Description   Final    BLOOD RIGHT HAND Performed at [facility], [address] [place], [place] [address]    Special Requests  Final    BOTTLES DRAWN AEROBIC ONLY Blood Culture results may not be optimal due to an excessive volume of blood received in culture bottles Performed at [facility], [address] [place], [place] [address]    Culture   Final    NO GROWTH 5 DAYS Performed at [facility], [address], [place], [place] [address]    Report Status 06/22/2021 FINAL  Final  Culture, blood (routine x 2)     Status: None   Collection Time: 06/17/21  4:55 AM   Specimen: BLOOD LEFT ARM  Result Value Ref Range Status   Specimen Description   Final    BLOOD LEFT ARM Performed at [facility], [address], [place], [place] [address]    Special Requests   Final    BOTTLES DRAWN AEROBIC ONLY Blood Culture adequate volume Performed at [facility], [address], [place], [place] [address]    Culture   Final    NO GROWTH 5 DAYS Performed at [facility], [address], [place], [place] [address]    Report Status 06/22/2021 FINAL  Final         Radiology Studies: No results found.      Scheduled Meds:  colchicine  0.6 mg Oral Daily   feeding supplement  237 mL Oral BID BM   folic acid  1 mg Oral Daily   lidocaine  1 patch Transdermal Q24H   magnesium oxide  400 mg Oral BID   multivitamin with minerals  1 tablet Oral Daily   pneumococcal 23 valent vaccine  0.5 mL Intramuscular Tomorrow-1000   simethicone  80 mg Oral QID   thiamine  100 mg Oral Daily   Or   thiamine  100 mg Intravenous Daily   vitamin B-12  1,000 mcg Oral Daily   Continuous Infusions:     LOS: 10 days    Time spent: 25 minutes    Dorcas Carrow, MD [facility] Pager [phone]

## 2021-06-25 NOTE — Progress Notes (Addendum)
Mobility Specialist - Progress Note    06/25/21 1200  Oxygen Therapy  SpO2 93 %  O2 Device Room Air  Mobility  Activity Ambulated in hall;Ambulated to bathroom  Level of Assistance Contact guard assist, steadying assist  Assistive Device Front wheel walker  Distance Ambulated (ft) 350 ft  Mobility Ambulated with assistance in hallway;Ambulated with assistance in room  Mobility Response Tolerated well  Mobility performed by Mobility specialist  $Mobility charge 1 Mobility    During mobility: 104 HR, 93% SpO2 Post-mobility: 98 HR, 96% SPO2  Pt ambulated 350 ft in hallway using RW. Pt c/o of abdominal pain due to fx ribs, but insisted on ambulating. Pt had SOB during ambulation and required 2 (2 minute) standing rest breaks. Pt's O2 levels were measured and ranged between 91%-93% during rest breaks. Pt returned room to use bathroom and then ambulated to EOB to sit. Pt left with bed alarm on and call bell at side.   Arliss Journey Mobility Specialist Acute Rehabilitation Services Phone: [phone] 06/25/21, 12:29 PM

## 2021-06-26 LAB — MAGNESIUM: Magnesium: 1.7 mg/dL (ref 1.7–2.4)

## 2021-06-26 MED ORDER — MAGNESIUM OXIDE -MG SUPPLEMENT 400 (240 MG) MG PO TABS: 400.0000 mg | ORAL_TABLET | Freq: Two times a day (BID) | ORAL | 0 refills | Status: AC

## 2021-06-26 MED ORDER — CYANOCOBALAMIN 1000 MCG PO TABS: 1000.0000 ug | ORAL_TABLET | Freq: Every day | ORAL | 0 refills | Status: AC

## 2021-06-26 MED ORDER — LIDOCAINE 5 % EX PTCH: 1.0000 | MEDICATED_PATCH | CUTANEOUS | 0 refills | Status: AC

## 2021-06-26 MED ORDER — COLCHICINE 0.6 MG PO TABS: 0.6000 mg | ORAL_TABLET | Freq: Every day | ORAL | 0 refills | Status: AC

## 2021-06-26 NOTE — Plan of Care (Signed)

## 2021-06-26 NOTE — TOC Transition Note (Signed)
Transition of Care [facility]) - CM/SW Discharge Note   Patient Details  Name: Joseph Bell MRN: [id number] Date of Birth: 06/26/1961  [facility] CM/SW Contact:  Ida Rogue, LCSW Phone Number: 06/26/2021, 11:23 AM   Clinical Narrative:   Patient who is stable for discharge has a ride to destination unknown with friend Liborio Nixon.  Patient is to follow up with Lavinia Sharps for medical needs, rolling walker ordered and delivered to room.  No further needs identified.  TOC sign off.    Final next level of care: Home/Self Care Barriers to Discharge: No Barriers Identified   Patient Goals and CMS Choice        Discharge Placement                       Discharge Plan and Services In-house Referral: Clinical Social Work                                   Social Determinants of Health (SDOH) Interventions     Readmission Risk Interventions No flowsheet data found.

## 2021-06-26 NOTE — Progress Notes (Signed)
Patient discharged  via wheelchair accompanied by staff. Patient friend Liborio Nixon came to pick him up. Pt. Is alert and oriented. No distress. Discharge instruction  and education discussed, pt. Verbalized understanding. All personal belongings are with the patient.

## 2021-06-26 NOTE — Discharge Summary (Signed)
Physician Discharge Summary  Cleda DaubWilliam T Quackenbush GNF:[id number] DOB: 02/19/1961 DOA: 06/15/2021  PCP: [name], [name], NP  Admit date: 06/15/2021 Discharge date: 06/26/2021  Admitted From: Street Disposition:  Street with friends support   Recommendations for Outpatient Follow-up:  Follow up with PCP in 1-2 weeks Please obtain BMP/CBC in one week   Home Health:NA  Equipment/Devices:NA   Discharge Condition:Stable   CODE STATUS:Full Code Diet recommendation: Regular diet . No alcohol   Discharge Summary: 60 year old gentleman who is homeless and was drinking alcohol presented to the emergency room on 7/12 with generalized weakness.  He fell and hurt on left side of the chest wall. Rhabdomyolysis, hyponatremia and ultimately found to have staph aureus bacteremia.  No primary source of infection found.     # MSSA bacteremia: Blood culture 7/12 MSSA.  Blood cultures 7/14 no growth. TTE with no evidence of endocarditis. TEE with no evidence of vegetations. Primary source unknown. Treated with Ancef for 1 week.   Patient was given Orbactiv 1200 mg once by infectious disease.  Completed antibiotic therapy and clinically improved.  # Dilutional hyponatremia: Resolved with isotonic fluid and ultimately with regular diet.  Monitor.  Electrolytes are fairly normal.  We will keep on a scheduled magnesium replacement on discharge.  # Alcoholism: So far no evidence of alcohol withdrawal.  Remains on symptomatic treatment with thiamine, B12 and multivitamins. Continue multivitamins on discharge.  # Acute gout flare: We will prescribe colchicine. Will use lidocaine patch on left chest wall.   Patient has completed antibiotic therapy. He had difficulties with ambulation. He worked very well with PT / OT and nursing staff every day to improve his mobility. He is reasonable safe on mobility with use of walker.  Patient is homeless, he was able to find some support with friends and pastor. He is  determined to quit drinking alcohol.     Discharge Diagnoses:  Principal Problem:   MSSA bacteremia Active Problems:   Hyponatremia   Alcohol abuse    Discharge Instructions  Discharge Instructions     Diet general   Complete by: As directed    Increase activity slowly   Complete by: As directed       Allergies as of 06/26/2021   No Known Allergies      Medication List     TAKE these medications    colchicine 0.6 MG tablet Take 1 tablet (0.6 mg total) by mouth daily. Start taking on: June 27, 2021   cyanocobalamin 1000 MCG tablet Take 1 tablet (1,000 mcg total) by mouth daily. Start taking on: June 27, 2021   lidocaine 5 % Commonly known as: LIDODERM Place 1 patch onto the skin daily for 15 days. Remove & Discard patch within 12 hours or as directed by MD Start taking on: June 27, 2021   magnesium oxide 400 (240 Mg) MG tablet Commonly known as: MAG-OX Take 1 tablet (400 mg total) by mouth 2 (two) times daily.               Durable Medical Equipment  (From admission, onward)           Start     Ordered   06/26/21 1015  For home use only DME Walker rolling  Once       Question Answer Comment  Walker: With 5 Inch Wheels   Patient needs a walker to treat with the following condition Bacteremia      06/26/21 1014  Follow-up Information     Placey, Chales Abrahams, NP. Schedule an appointment as soon as possible for a visit in 2 week(s).   Why: This is a free service.  Please follow up with her for your medical needs. She also has some hours at the [facility]. Contact information: [address] [place] [address] [phone]                No Known Allergies  Consultations: Infectious disease   Procedures/Studies: DG Ribs Unilateral W/Chest Left  Result Date: 06/15/2021 CLINICAL DATA:  Fall.  Pain EXAM: LEFT RIBS AND CHEST - 3+ VIEW COMPARISON:  03/18/2017 FINDINGS: Stable cardiomediastinal contours. There are  coarsened interstitial markings identified bilaterally. No airspace opacities. Two chronic appearing fractures noted involving the posterolateral aspect of the left ninth and tenth ribs. No acute displaced rib fractures identified. IMPRESSION: 1. No acute cardiopulmonary abnormalities. 2. Chronic appearing fractures noted involving the posterolateral aspect of the left ninth and tenth ribs. Electronically Signed   By: Signa Kell M.D.   On: 06/15/2021 13:26   DG Wrist Complete Left  Result Date: 06/18/2021 CLINICAL DATA:  Diffuse left wrist pain.  No known injury. EXAM: LEFT WRIST - COMPLETE 3+ VIEW COMPARISON:  None. FINDINGS: No acute bony or joint abnormality is identified. No focal bony lesion. Mild appearing osteoarthritis is seen at the first [facility] and scaphoid trapezium trapezoid joints. No chondrocalcinosis. Soft tissues are negative. IMPRESSION: No acute abnormality. 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 Electronically Signed   By: Drusilla Kanner M.D.   On: 06/18/2021 13:44   DG Chest Port 1 View  Result Date: 06/15/2021 CLINICAL DATA:  60 year old male male with medical history significant for alcohol abuse with altered mental status. EXAM: PORTABLE CHEST 1 VIEW COMPARISON:  June 15, 2021, earlier on the same date. FINDINGS: EKG leads project over the chest. Cardiomediastinal contours and hilar structures are normal. No sign of lobar consolidation or evidence of pleural effusion. Mild subtle airspace disease in the retrocardiac region. No pneumothorax. On limited assessment no acute skeletal process. Signs of prior trauma to the LEFT clavicle similar to previous imaging. IMPRESSION: Mild subtle airspace disease in the retrocardiac region, potentially related to atelectasis, difficult to exclude developing infection. No lobar consolidation or sign of effusion. Signs of chronic fracture of LEFT posterolateral ribs as outlined on previous report. Electronically Signed   By: Donzetta Kohut M.D.   On: 06/15/2021 19:28   DG Knee Complete 4 Views Left  Result Date: 06/15/2021 CLINICAL DATA:  Larey Seat.  Bilateral knee pain. EXAM: RIGHT KNEE - COMPLETE 4+ VIEW; LEFT KNEE - COMPLETE 4+ VIEW COMPARISON:  None. FINDINGS: The joint spaces are maintained. No acute fracture. No joint effusion. IMPRESSION: No acute bony findings or joint effusion. Electronically Signed   By: Rudie Meyer M.D.   On: 06/15/2021 13:26   DG Knee Complete 4 Views Right  Result Date: 06/15/2021 CLINICAL DATA:  Larey Seat.  Bilateral knee pain. EXAM: RIGHT KNEE - COMPLETE 4+ VIEW; LEFT KNEE - COMPLETE 4+ VIEW COMPARISON:  None. FINDINGS: The joint spaces are maintained. No acute fracture. No joint effusion. IMPRESSION: No acute bony findings or joint effusion. Electronically Signed   By: Rudie Meyer M.D.   On: 06/15/2021 13:26   DG Abd 2 Views  Result Date: 06/16/2021 CLINICAL DATA:  Abdominal pain and distension EXAM: ABDOMEN - 2 VIEW COMPARISON:  None. FINDINGS: Scattered large and small bowel gas is noted. No free air is seen.  No abnormal mass or abnormal calcifications are noted. No bony abnormality is seen. IMPRESSION: Scattered large and small bowel gas which may represent a mild ileus. No definitive obstructive changes are seen. Electronically Signed   By: Alcide Clever M.D.   On: 06/16/2021 19:00   ECHOCARDIOGRAM COMPLETE  Result Date: 06/18/2021    ECHOCARDIOGRAM REPORT   Patient Name:   IZAYA [facility]ERTON [facility] Date of Exam: 06/18/2021 Medical Rec #:  [id number]          Height:       65.0 in Accession #:    [id number]         Weight:       179.9 lb Date of Birth:  [birth year]           BSA:          1.891 m Patient Age:    59 years           BP:           132/69 mmHg Patient Gender: M                  HR:           87 bpm. Exam Location:  Inpatient Procedure: 2D Echo Indications:    Bacteremia  History:        Patient has no prior history of Echocardiogram examinations.                 Signs/Symptoms:Altered Mental Status,  Dyspnea and Shortness of                 Breath. ETOH. Rhabdomyolysis.  Sonographer:    Sheralyn Boatman RDCS Referring Phys: [id number] CYNTHIA SNIDER  Sonographer Comments: Technically difficult study due to poor echo windows. Very difficult study. Patient was moving and could not follow directions to valsalva or hold breath. IMPRESSIONS  1. 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 Peak gradient ~28 mmHG. There is no asymmetric hypertrophy or evidence of mitral valve SAM. There are no findings to suggest hypertrophic cardiomyopathy. These findings are related to the hyperdynamic LV function and small LV cavity. Left ventricular ejection fraction, by estimation, is >75%. The left ventricle has hyperdynamic function. The left ventricle has no regional wall motion abnormalities. There is mild concentric left ventricular hypertrophy. Left ventricular diastolic parameters are consistent with Grade I diastolic dysfunction (impaired relaxation).  2. Right ventricular systolic function is normal. The right ventricular size is normal. Tricuspid regurgitation signal is inadequate for assessing PA pressure.  3. The mitral valve is grossly normal. Mild mitral valve regurgitation. No evidence of mitral stenosis.  4. The aortic valve is grossly normal. There is mild calcification of the aortic valve. Aortic valve regurgitation is mild. Mild to moderate aortic valve sclerosis/calcification is present, without any evidence of aortic stenosis.  5. There is mild dilatation of the ascending aorta, measuring 40 mm.  6. The inferior vena cava is normal in size with greater than 50% respiratory variability, suggesting right atrial pressure of 3 mmHg. Conclusion(s)/Recommendation(s): No evidence of valvular vegetations on this transthoracic echocardiogram. Would recommend a transesophageal echocardiogram to exclude infective endocarditis if clinically indicated. FINDINGS  Left  Ventricle: There is an intracavitary gradient due to chordal SAM from a small underfilled LV with hyperdynamic function. Peak gradient ~28 mmHG. There is no asymmetric hypertrophy or evidence of mitral valve SAM. There are no findings to suggest hypertrophic cardiomyopathy. These findings are  related to the hyperdynamic LV function and small LV cavity. Left ventricular ejection fraction, by estimation, is >75%. The left ventricle has hyperdynamic function. The left ventricle has no regional wall  motion abnormalities. The left ventricular internal cavity size was small. There is mild concentric left ventricular hypertrophy. Left ventricular diastolic parameters are consistent with Grade I diastolic dysfunction (impaired relaxation). Right Ventricle: The right ventricular size is normal. No increase in right ventricular wall thickness. Right ventricular systolic function is normal. Tricuspid regurgitation signal is inadequate for assessing PA pressure. Left Atrium: Left atrial size was normal in size. Right Atrium: Right atrial size was normal in size. Pericardium: Trivial pericardial effusion is present. Presence of pericardial fat pad. Mitral Valve: The mitral valve is grossly normal. Mild mitral valve regurgitation. No evidence of mitral valve stenosis. Tricuspid Valve: The tricuspid valve is grossly normal. Tricuspid valve regurgitation is trivial. No evidence of tricuspid stenosis. Aortic Valve: The aortic valve is grossly normal. There is mild calcification of the aortic valve. Aortic valve regurgitation is mild. Aortic regurgitation PHT measures 379 msec. Mild to moderate aortic valve sclerosis/calcification is present, without any evidence of aortic stenosis. Aortic valve mean gradient measures 10.8 mmHg. Aortic valve peak gradient measures 18.3 mmHg. Aortic valve area, by VTI measures 1.87 cm. Pulmonic Valve: The pulmonic valve was grossly normal. Pulmonic valve regurgitation is not visualized. No evidence  of pulmonic stenosis. Aorta: The aortic root is normal in size and structure. There is mild dilatation of the ascending aorta, measuring 40 mm. Venous: The inferior vena cava is normal in size with greater than 50% respiratory variability, suggesting right atrial pressure of 3 mmHg. IAS/Shunts: The atrial septum is grossly normal.  LEFT VENTRICLE PLAX 2D LVIDd:         3.70 cm      Diastology LVIDs:         2.40 cm      LV e' medial:    5.98 cm/s LV PW:         1.60 cm      LV E/e' medial:  15.3 LV IVS:        1.40 cm      LV e' lateral:   8.05 cm/s LVOT diam:     1.60 cm      LV E/e' lateral: 11.4 LV SV:         79 LV SV Index:   42 LVOT Area:     2.01 cm  LV Volumes (MOD) LV vol d, MOD A2C: 102.0 ml LV vol d, MOD A4C: 82.2 ml LV vol s, MOD A2C: 40.7 ml LV vol s, MOD A4C: 35.4 ml LV SV MOD A2C:     61.4 ml LV SV MOD A4C:     82.2 ml LV SV MOD BP:      58.6 ml RIGHT VENTRICLE             IVC RV S prime:     12.20 cm/s  IVC diam: 1.40 cm TAPSE (M-mode): 2.1 cm LEFT ATRIUM             Index       RIGHT ATRIUM           Index LA diam:        2.80 cm 1.48 cm/m  RA Area:     11.80 cm LA Vol (A2C):   29.1 ml 15.39 ml/m RA Volume:   23.60 ml  12.48 ml/m LA Vol (A4C):   23.2 ml  12.27 ml/m LA Biplane Vol: 26.5 ml 14.01 ml/m  AORTIC VALVE AV Area (Vmax):    1.97 cm AV Area (Vmean):   1.89 cm AV Area (VTI):     1.87 cm AV Vmax:           214.00 cm/s AV Vmean:          151.750 cm/s AV VTI:            0.423 m AV Peak Grad:      18.3 mmHg AV Mean Grad:      10.8 mmHg LVOT Vmax:         210.00 cm/s LVOT Vmean:        143.000 cm/s LVOT VTI:          0.393 m LVOT/AV VTI ratio: 0.93 AI PHT:            379 msec  AORTA Ao Root diam: 3.60 cm Ao Asc diam:  4.00 cm MITRAL VALVE MV Area (PHT): 4.15 cm     SHUNTS MV Decel Time: 183 msec     Systemic VTI:  0.39 m MV E velocity: 91.70 cm/s   Systemic Diam: 1.60 cm MV A velocity: 110.00 cm/s MV E/A ratio:  0.83 Lennie Odor MD Electronically signed by Lennie Odor MD Signature  Date/Time: 06/18/2021/3:25:04 PM    Final    ECHO TEE  Result Date: 06/22/2021    TRANSESOPHOGEAL ECHO REPORT   Patient Name:   LON KLIPPEL [facility] Date of Exam: 06/22/2021 Medical Rec #:  [id number]          Height:       65.0 in Accession #:    [id number]         Weight:       179.9 lb Date of Birth:  Oct 28, 1961           BSA:          1.891 m Patient Age:    59 years           BP:           110/78 mmHg Patient Gender: M                  HR:           91 bpm. Exam Location:  Inpatient Procedure: Transesophageal Echo and Color Doppler Indications:     Bacteremia  History:         Patient has prior history of Echocardiogram examinations, most                  recent 06/18/2021. Risk Factors:ETOH.  Sonographer:     Eulah Pont RDCS Referring Phys:  [id number] Dorcas Carrow Diagnosing Phys: [name] MD PROCEDURE: After discussion of the risks and benefits of a TEE, an informed consent was obtained from the patient. The transesophogeal probe was passed without difficulty through the esophogus of the patient. Sedation performed by different physician. The patient was monitored while under deep sedation. Anesthestetic sedation was provided intravenously by Anesthesiology: 230.76mg  of Propofol, 60mg  of Lidocaine. The patient developed no complications during the procedure. IMPRESSIONS  1. No vegetations.  2. Left ventricular ejection fraction, by estimation, is 55 to 60%. The left ventricle has normal function.  3. Right ventricular systolic function is normal. The right ventricular size is normal.  4. No left atrial/left atrial appendage thrombus was detected.  5. The mitral valve is normal in structure. Mild mitral valve regurgitation.  6. The aortic  valve is tricuspid. Aortic valve regurgitation is mild.  7. There is mild (Grade II) plaque involving the descending aorta. FINDINGS  Left Ventricle: Left ventricular ejection fraction, by estimation, is 55 to 60%. The left ventricle has normal function. The left  ventricular internal cavity size was normal in size. Right Ventricle: The right ventricular size is normal.Right ventricular systolic function is normal. Left Atrium: Left atrial size was normal in size. No left atrial/left atrial appendage thrombus was detected. Right Atrium: Right atrial size was normal in size. Pericardium: There is no evidence of pericardial effusion. Mitral Valve: The mitral valve is normal in structure. Mild mitral valve regurgitation. Tricuspid Valve: The tricuspid valve is normal in structure. Tricuspid valve regurgitation is mild. Aortic Valve: The aortic valve is tricuspid. Aortic valve regurgitation is mild. Pulmonic Valve: The pulmonic valve was normal in structure. Pulmonic valve regurgitation is trivial. Aorta: The aortic root is normal in size and structure. There is mild (Grade II) plaque involving the descending aorta. IAS/Shunts: No atrial level shunt detected by color flow Doppler. Additional Comments: No vegetations. [name] MD Electronically signed by [name] MD Signature Date/Time: 06/22/2021/1:33:54 PM    Final    DG Hip Unilat With Pelvis 2-3 Views Right  Result Date: 06/15/2021 CLINICAL DATA:  L.  Right hip pain. EXAM: DG HIP (WITH OR WITHOUT PELVIS) 2-3V RIGHT COMPARISON:  None. FINDINGS: Both hips are normally located. Mild degenerative changes. No acute fracture. The pubic symphysis and SI joints are intact. No pelvic fractures or bone lesions. IMPRESSION: Mild degenerative changes but no acute bony findings. Electronically Signed   By: Rudie Meyer M.D.   On: 06/15/2021 13:24   (Echo, Carotid, EGD, Colonoscopy, ERCP)    Subjective: Patient seen and examined.  Feels much better today.  I called and discussed his care with his good friend Ms. Liborio Nixon who has been helping him to get more support to set up his tent.  Patient feels safe to go home.  He is determined to quit drinking alcohol and get back to housing.  Walker was arranged and was instructed  all the fall precautions.   Discharge Exam: Vitals:   06/25/21 1945 06/26/21 0536  BP: 112/67 116/73  Pulse: 81 92  Resp: 20 20  Temp: 98.6 F (37 C) 98.1 F (36.7 C)  SpO2: 96% 92%   Vitals:   06/25/21 1200 06/25/21 1404 06/25/21 1945 06/26/21 0536  BP:  137/76 112/67 116/73  Pulse:  94 81 92  Resp:  20 20 20   Temp:  99.6 F (37.6 C) 98.6 F (37 C) 98.1 F (36.7 C)  TempSrc:  Oral Oral Oral  SpO2: 93% 95% 96% 92%  Weight:      Height:        General: Pt is alert, awake, not in acute distress Walking in and out of the bathroom with the use of a walker. Cardiovascular: RRR, S1/S2 +, no rubs, no gallops Respiratory: CTA bilaterally, no wheezing, no rhonchi He does have some tenderness on the left chest wall. Abdominal: Soft, NT, ND, bowel sounds + Extremities: no edema, no cyanosis    The results of significant diagnostics from this hospitalization (including imaging, microbiology, ancillary and laboratory) are listed below for reference.     Microbiology: Recent Results (from the past 240 hour(s))  Culture, blood (routine x 2)     Status: None   Collection Time: 06/17/21  4:54 AM   Specimen: BLOOD RIGHT HAND  Result Value Ref Range Status  Specimen Description   Final    BLOOD RIGHT HAND Performed at [facility], [address], [place], [place] [address]    Special Requests   Final    BOTTLES DRAWN AEROBIC ONLY Blood Culture results may not be optimal due to an excessive volume of blood received in culture bottles Performed at [facility], [address] [place], [place] [address]    Culture   Final    NO GROWTH 5 DAYS Performed at [facility], [address], [place], [place] [address]    Report Status 06/22/2021 FINAL  Final  Culture, blood (routine x 2)     Status: None   Collection Time: 06/17/21  4:55 AM   Specimen: BLOOD LEFT ARM  Result Value Ref Range Status   Specimen Description   Final     BLOOD LEFT ARM Performed at [facility], [address], [place], [place] [address]    Special Requests   Final    BOTTLES DRAWN AEROBIC ONLY Blood Culture adequate volume Performed at [facility], [address], [place], [place] [address]    Culture   Final    NO GROWTH 5 DAYS Performed at [facility], [address], [place], [place] [address]    Report Status 06/22/2021 FINAL  Final     Labs: BNP (last 3 results) No results for input(s): BNP in the last 8760 hours. Basic Metabolic Panel: Recent Labs  Lab 06/20/21 0549 06/21/21 0445 06/22/21 0455 06/23/21 0433 06/24/21 0544 06/25/21 0445 06/26/21 0508  NA 128* 129* 127*  --   --   --   --   K 3.0* 3.9 3.7  --   --   --   --   CL 92* 94* 92*  --   --   --   --   CO2 --   --   --   --   GLUCOSE 108* 106* 100*  --   --   --   --   BUN --   --   --   --   CREATININE 0.48* 0.48* 0.39*  --   --   --   --   CALCIUM 8.5* 8.7* 8.3*  --   --   --   --   MG 1.3* 1.6* 1.6* 1.6* 1.8 1.8 1.7   Liver Function Tests: Recent Labs  Lab 06/20/21 0549 06/21/21 0445  AST 31 24  ALT 23 19  ALKPHOS 61 52  BILITOT 1.2 1.1  PROT 5.5* 6.0*  ALBUMIN 2.2* 2.3*   No results for input(s): LIPASE, AMYLASE in the last 168 hours. No results for input(s): AMMONIA in the last 168 hours. CBC: Recent Labs  Lab 06/20/21 0549 06/21/21 0445 06/22/21 0455  WBC 13.4* 13.5* 15.6*  HGB 12.6* 13.3 12.5*  HCT 35.8* 39.0 36.7*  MCV 101.4* 104.0* 103.4*  PLT 212 245 262   Cardiac Enzymes: No results for input(s): CKTOTAL, CKMB, CKMBINDEX, TROPONINI in the last 168 hours. BNP: Invalid input(s): POCBNP CBG: No results for input(s): GLUCAP in the last 168 hours. D-Dimer No results for input(s): DDIMER in the last 72 hours. Hgb A1c No results for input(s): HGBA1C in the last 72 hours. Lipid Profile No results for input(s): CHOL, HDL, LDLCALC, TRIG, CHOLHDL, LDLDIRECT in the last  72 hours. Thyroid function studies No results for input(s): TSH, T4TOTAL, T3FREE, THYROIDAB in the  last 72 hours.  Invalid input(s): FREET3 Anemia work up No results for input(s): VITAMINB12, FOLATE, FERRITIN, TIBC, IRON, RETICCTPCT in the last 72 hours. Urinalysis    Component Value Date/Time   COLORURINE AMBER (A) 06/15/2021 1800   APPEARANCEUR CLEAR 06/15/2021 1800   LABSPEC 1.021 06/15/2021 1800   PHURINE 5.0 06/15/2021 1800   GLUCOSEU NEGATIVE 06/15/2021 1800   HGBUR LARGE (A) 06/15/2021 1800   BILIRUBINUR NEGATIVE 06/15/2021 1800   KETONESUR 20 (A) 06/15/2021 1800   PROTEINUR 100 (A) 06/15/2021 1800   NITRITE NEGATIVE 06/15/2021 1800   LEUKOCYTESUR NEGATIVE 06/15/2021 1800   Sepsis Labs Invalid input(s): PROCALCITONIN,  WBC,  LACTICIDVEN Microbiology Recent Results (from the past 240 hour(s))  Culture, blood (routine x 2)     Status: None   Collection Time: 06/17/21  4:54 AM   Specimen: BLOOD RIGHT HAND  Result Value Ref Range Status   Specimen Description   Final    BLOOD RIGHT HAND Performed at [facility], [address] [place], [place] [address]    Special Requests   Final    BOTTLES DRAWN AEROBIC ONLY Blood Culture results may not be optimal due to an excessive volume of blood received in culture bottles Performed at [facility], [address] [place], [place] [address]    Culture   Final    NO GROWTH 5 DAYS Performed at [facility], [address] [place], [place] [address]    Report Status 06/22/2021 FINAL  Final  Culture, blood (routine x 2)     Status: None   Collection Time: 06/17/21  4:55 AM   Specimen: BLOOD LEFT ARM  Result Value Ref Range Status   Specimen Description   Final    BLOOD LEFT ARM Performed at [facility], [address] [place], [place] [address]    Special Requests   Final    BOTTLES DRAWN AEROBIC ONLY Blood Culture adequate volume Performed at [facility], [address] [place], [place] [address]    Culture   Final    NO GROWTH 5 DAYS Performed at [facility], [address], [place], [place] [address]    Report Status 06/22/2021 FINAL  Final     Time coordinating discharge:  35 minutes  SIGNED:   Dorcas Carrow, MD  [facility] 06/26/2021, 12:01 PM

## 2022-08-30 ENCOUNTER — Other Ambulatory Visit: Payer: Self-pay

## 2022-08-30 ENCOUNTER — Emergency Department (HOSPITAL_COMMUNITY)
Admission: EM | Admit: 2022-08-30 | Discharge: 2022-08-31 | Discharge status: Against Medical Advice | Payer: Self-pay | Attending: Emergency Medicine | Admitting: Emergency Medicine

## 2022-08-30 DIAGNOSIS — Z5321 Procedure and treatment not carried out due to patient leaving prior to being seen by health care provider: Secondary | ICD-10-CM | POA: Insufficient documentation

## 2022-08-30 DIAGNOSIS — F10129 Alcohol abuse with intoxication, unspecified: Secondary | ICD-10-CM | POA: Insufficient documentation

## 2022-08-30 DIAGNOSIS — M79671 Pain in right foot: Secondary | ICD-10-CM | POA: Insufficient documentation

## 2022-08-30 DIAGNOSIS — M79672 Pain in left foot: Secondary | ICD-10-CM | POA: Insufficient documentation

## 2022-08-30 NOTE — ED Triage Notes (Signed)
Pt arrived by [facility] from the side of the rode. Pt c/o bilateral feet pain; and requesting food and soda. Pt A&O x4; also admits to large amount of ETOH today prior to arrival.

## 2022-08-30 NOTE — ED Provider Triage Note (Signed)
Emergency Medicine Provider Triage Evaluation Note  Joseph Bell , a 61 y.o. male  was evaluated in triage.  Pt complains of bilateral foot pain.  Patient reports "gout" causing pain to his bilateral feet.  Patient presents by EMS from the side of the road.  Patient endorsed to them large amounts of EtOH but denied EtOH to me. Review of Systems  Positive: Foot pain Negative: Fever  Physical Exam  BP 126/79 (BP Location: Left Arm)   Pulse 73   Temp 97.9 F (36.6 C) (Oral)   Resp 16   SpO2 97%  Gen:   Awake, no distress   Resp:  Normal effort  MSK:   Moves extremities without difficulty  Other:  Ambulates with normal gait.  Patient speech is slurred but answers questions appropriately.  Medical Decision Making  Medically screening exam initiated at 8:13 PM.  Appropriate orders placed.  Joseph Bell was informed that the remainder of the evaluation will be completed by another provider, this initial triage assessment does not replace that evaluation, and the importance of remaining in the ED until their evaluation is complete.  Patient presents for intoxication by EMS.  Reports bilateral foot pain.  Vital signs reassuring.  No indication for imaging or lab work at this time.   Doristine Devoid, PA-C 08/30/22 2014

## 2022-08-31 ENCOUNTER — Other Ambulatory Visit: Payer: Self-pay

## 2022-08-31 ENCOUNTER — Emergency Department (HOSPITAL_COMMUNITY): Payer: Self-pay

## 2022-08-31 ENCOUNTER — Emergency Department (HOSPITAL_COMMUNITY)
Admission: EM | Admit: 2022-08-31 | Discharge: 2022-08-31 | Disposition: A | Discharge status: Home / Self Care | Payer: Self-pay | Attending: Emergency Medicine | Admitting: Emergency Medicine

## 2022-08-31 ENCOUNTER — Encounter (HOSPITAL_COMMUNITY): Payer: Self-pay

## 2022-08-31 DIAGNOSIS — W19XXXA Unspecified fall, initial encounter: Secondary | ICD-10-CM

## 2022-08-31 DIAGNOSIS — W010XXA Fall on same level from slipping, tripping and stumbling without subsequent striking against object, initial encounter: Secondary | ICD-10-CM | POA: Insufficient documentation

## 2022-08-31 DIAGNOSIS — M25462 Effusion, left knee: Secondary | ICD-10-CM | POA: Insufficient documentation

## 2022-08-31 MED ORDER — KETOROLAC TROMETHAMINE 30 MG/ML IJ SOLN
30.0000 mg | Freq: Once | INTRAMUSCULAR | Status: AC
  Administered 2022-08-31: 30 mg via INTRAMUSCULAR
  Filled 2022-08-31: qty 1

## 2022-08-31 NOTE — ED Notes (Addendum)
Pt called 4x no answer  

## 2022-08-31 NOTE — ED Notes (Signed)
Pt called 3x no answer  

## 2022-08-31 NOTE — ED Provider Triage Note (Signed)
Emergency Medicine Provider Triage Evaluation Note  Joseph Bell , a 60 y.o. male  was evaluated in triage.  Pt complains of knee pain.  Patient called via EMS from gas station reporting that he could not walk due to pain in both of his knees.  Reports he was at [facility] yesterday.  Left and when walking back fell and landed on his knees.  He reports now knee pain is keeping him from being able to walk.  Denies hitting his head or any other injuries..  Review of Systems  Positive: Knee pain Negative: Head injury  Physical Exam  BP 111/70   Pulse 80   Temp 98.3 F (36.8 C) (Oral)   Resp 20   SpO2 95%  Gen:   Awake, no distress   Resp:  Normal effort  Other:  Tenderness over bilateral knees, unable to fully assess knees in triage without patient removing his jeans.  Will need further exam.  Medical Decision Making  Medically screening exam initiated at 3:27 PM.  Appropriate orders placed.  Joseph Bell was informed that the remainder of the evaluation will be completed by another provider, this initial triage assessment does not replace that evaluation, and the importance of remaining in the ED until their evaluation is complete.  X-rays of bilateral knees ordered.   Joseph Bell, [place] 08/31/22 1529

## 2022-08-31 NOTE — ED Triage Notes (Signed)
Pt reports left knee pain after fall last night.  Pt reports unable to walk. Etoh on board

## 2022-08-31 NOTE — Social Work (Signed)
CSW gave the Pt a walker, Zack from [employer] knows the Pt does not have insurance. CSW spoke to the Pt. The Pt is homeless and states that he has no where to go. The Pt has asked if he can stay here tonight. The doctor states that there is no reason to keep the Pt as he is medically cleared at this time. CSW explained to the Pt that he needs to apply for his social security as well try to get help through social services. The Pt does have a walker and will be D/C with proper resources to follow up in the community, as well a bus pass to get where he needs to go. TOC is signing off at this time.

## 2022-08-31 NOTE — ED Notes (Signed)
Patient assisted to waiting room to wait for shelters/resources in the morning.  Provided with sandwich box and water prior to d/c.  Charge RN made aware

## 2022-08-31 NOTE — Discharge Instructions (Addendum)
As discussed, your pain in the knees following a fall will likely take days to improve, and you should follow-up with our orthopedic colleagues to facilitate this.  Please use the provided knee support, take ibuprofen, 400 mg, 3 times daily with food, and monitor your condition carefully.  Do not hesitate to return here.  For additional assistance, please consider following up at the interactive resource center, information provided aboves.  Please call social services or even social security to try to get assistance

## 2022-08-31 NOTE — ED Provider Notes (Signed)
[facility] DEPT Provider Note   CSN: [id number] Arrival date & time: 08/31/22  1517     History  Chief Complaint  Patient presents with   Knee Pain    Joseph Bell is a 61 y.o. male.  HPI Patient is a 61 year old male presenting with a chief complaint of bilateral knee pain. Patient was seen yesterday at [facility] for swelling in his feet. He states he was leaving the hospital yesterday evening when he tripped and feel down on both knees. Patient called EMS from a gas station today reporting that he could not walk. He states he is normally able to walk fine but sometimes has to hold on to something to keep his balance. Patient states his left knee hurts worse than the right. Patient does have a PMH of alcohol abuse. He denies fever, cough, dyspnea, dizziness, numbness or tingling.     Home Medications Prior to Admission medications   Medication Sig Start Date End Date Taking? Authorizing Provider  colchicine 0.6 MG tablet Take 1 tablet (0.6 mg total) by mouth daily. 06/27/21 07/27/21  Barb Merino, MD      Allergies    Patient has no known allergies.    Review of Systems   Review of Systems  All other systems reviewed and are negative.   Physical Exam Updated Vital Signs BP 111/70   Pulse 80   Temp 98.3 F (36.8 C) (Oral)   Resp 20   SpO2 95%  Physical Exam Vitals and nursing note reviewed.  Constitutional:      General: He is not in acute distress.    Appearance: He is well-developed.  HENT:     Head: Normocephalic and atraumatic.  Eyes:     Conjunctiva/sclera: Conjunctivae normal.  Cardiovascular:     Rate and Rhythm: Normal rate and regular rhythm.  Pulmonary:     Effort: Pulmonary effort is normal. No respiratory distress.     Breath sounds: No stridor.  Abdominal:     General: There is no distension.  Musculoskeletal:     Comments: Patient moves all extremities spontaneously and to command.  In particular, the  patient moves his right leg, hip, knee, ankle, freely, in flexion, extension, abduction, abduction, is able to remove his sock, put back on, tie his shoe.  He does this in spite of describing pain in the right knee.  Left leg proximal, distal unremarkable, with the left knee has an appreciable effusion.  He can flex against resistance, and extend as well, but has pain in doing so.  There are superficial abrasions bilaterally.  Skin:    General: Skin is warm and dry.  Neurological:     Mental Status: He is alert and oriented to person, place, and time.     ED Results / Procedures / Treatments     Radiology CT Knee Left Wo Contrast  Result Date: 08/31/2022 CLINICAL DATA:  Left knee pain after fall last night. EXAM: CT OF THE LEFT KNEE WITHOUT CONTRAST TECHNIQUE: Multidetector CT imaging of the left knee was performed according to the standard protocol. Multiplanar CT image reconstructions were also generated. RADIATION DOSE REDUCTION: This exam was performed according to the departmental dose-optimization program which includes automated exposure control, adjustment of the mA and/or kV according to patient size and/or use of iterative reconstruction technique. COMPARISON:  Left knee x-rays from same day. FINDINGS: Bones/Joint/Cartilage No fracture or dislocation. Mild patellofemoral compartment degenerative changes. Trace to small joint effusion. Ligaments  Ligaments are suboptimally evaluated by CT. Muscles and Tendons Grossly intact. Soft tissue No fluid collection or hematoma.  No soft tissue mass. IMPRESSION: 1. No acute osseous abnormality. Electronically Signed   By: Obie Dredge M.D.   On: 08/31/2022 19:45   DG Knee Complete 4 Views Left  Result Date: 08/31/2022 CLINICAL DATA:  Fall knee pain EXAM: LEFT KNEE - COMPLETE 4+ VIEW COMPARISON:  06/15/2021 FINDINGS: No malalignment. Positive for knee effusion. Calcification adjacent to lower pole of the patella slightly different orientation from  prior radiograph in 2022 with some irregularity at the inferior patellar pole. IMPRESSION: Knee effusion. Calcification at lower pole of the patella is indeterminate for small fracture. A calcification is noted on the previous exam from 2022 but location and orientation is slightly different. Electronically Signed   By: Jasmine Pang M.D.   On: 08/31/2022 16:00   DG Knee Complete 4 Views Right  Result Date: 08/31/2022 CLINICAL DATA:  Fall with knee pain EXAM: RIGHT KNEE - COMPLETE 4+ VIEW COMPARISON:  06/15/2021 FINDINGS: No fracture or malalignment. Mild patellofemoral and lateral joint space spurring. No sizable knee effusion. IMPRESSION: Mild degenerative change.  No acute osseous abnormality Electronically Signed   By: Jasmine Pang M.D.   On: 08/31/2022 15:56    Procedures Procedures    Medications Ordered in ED Medications  ketorolac (TORADOL) 30 MG/ML injection 30 mg (has no administration in time range)    ED Course/ Medical Decision Making/ A&P                           Medical Decision Making Adult male presents with bilateral knee pain after fall that occurred yesterday.  Pain is worse on the left, and on physical exam is an effusion.  X-ray abnormal and CT scan was performed subsequently, all reviewed, notable for no acute fracture.  With preserved extension bilaterally, quadriceps tendon is intact, there is suspicion for contusion with effusion contributing to his pain as he is distally neurovascular intact.  I discussed his case with our social work colleagues to facilitate cane or walker for assistance with ambulation, as well as homeless resources.  Amount and/or Complexity of Data Reviewed Radiology: ordered. Decision-making details documented in ED Course.  Risk Prescription drug management.   Final Clinical Impression(s) / ED Diagnoses Final diagnoses:  Fall, initial encounter  Effusion of left knee    Rx / DC Orders ED Discharge Orders     None          Gerhard Munch, MD 08/31/22 2059

## 2022-08-31 NOTE — ED Notes (Signed)
Patient provided with a walker by SW/CSW

## 2022-10-08 IMAGING — CR DG KNEE COMPLETE 4+V*L*
4 series · 4 of 4 positions shown · non-contrast
Comparison: None.

CLINICAL DATA: Fell.  Bilateral knee pain.

EXAM:
RIGHT KNEE - COMPLETE 4+ VIEW; LEFT KNEE - COMPLETE 4+ VIEW

[x knee ap left (1 of 3)]
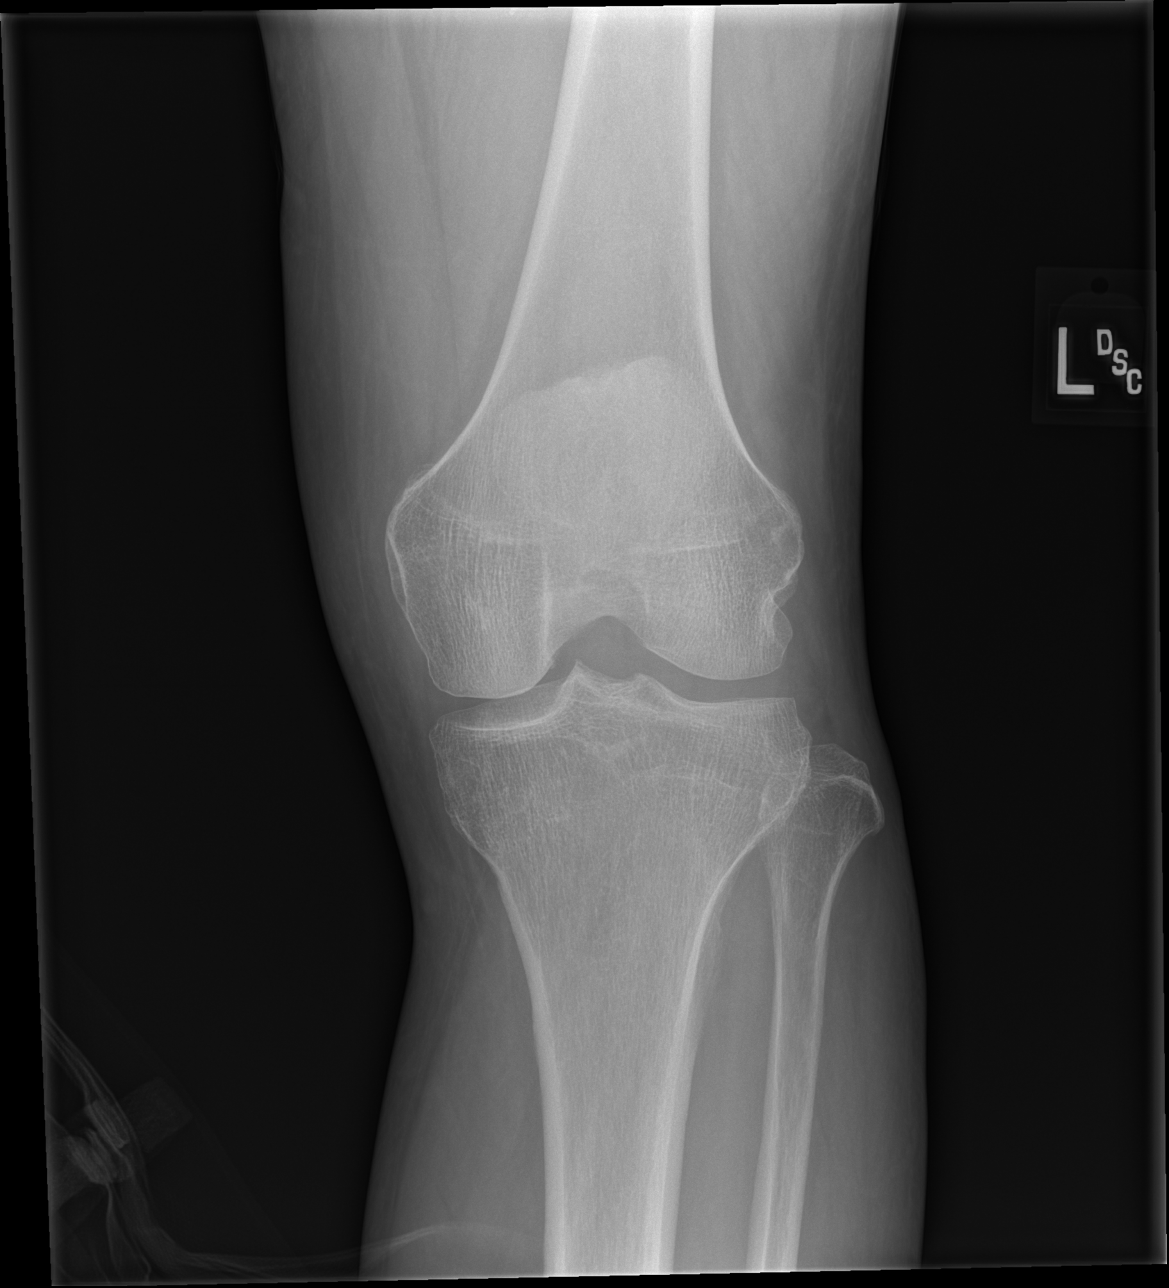

[x knee ap left (2 of 3)]
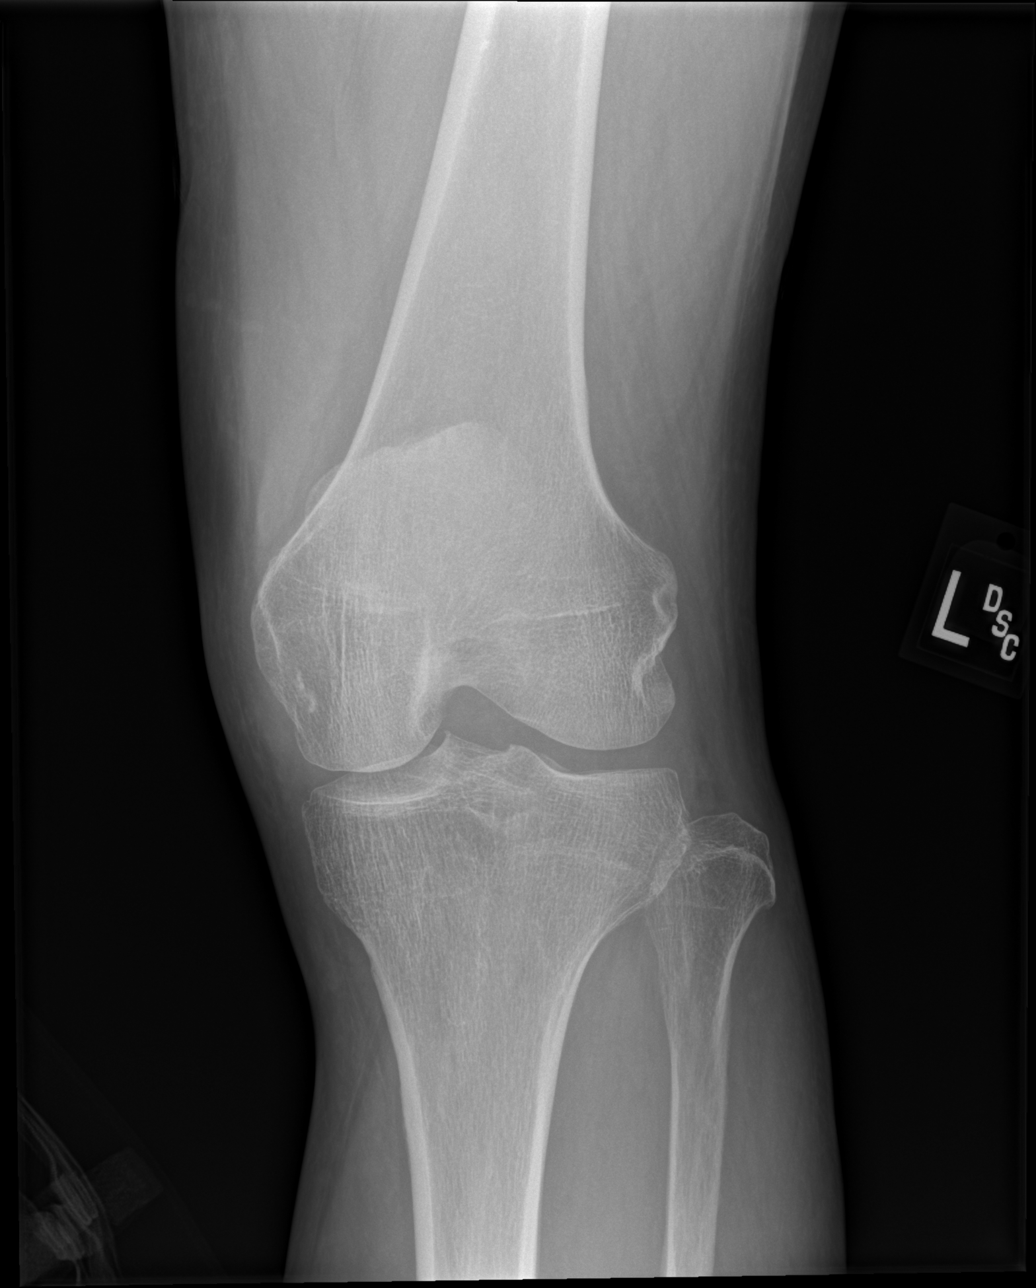

[x knee ap left (3 of 3)]
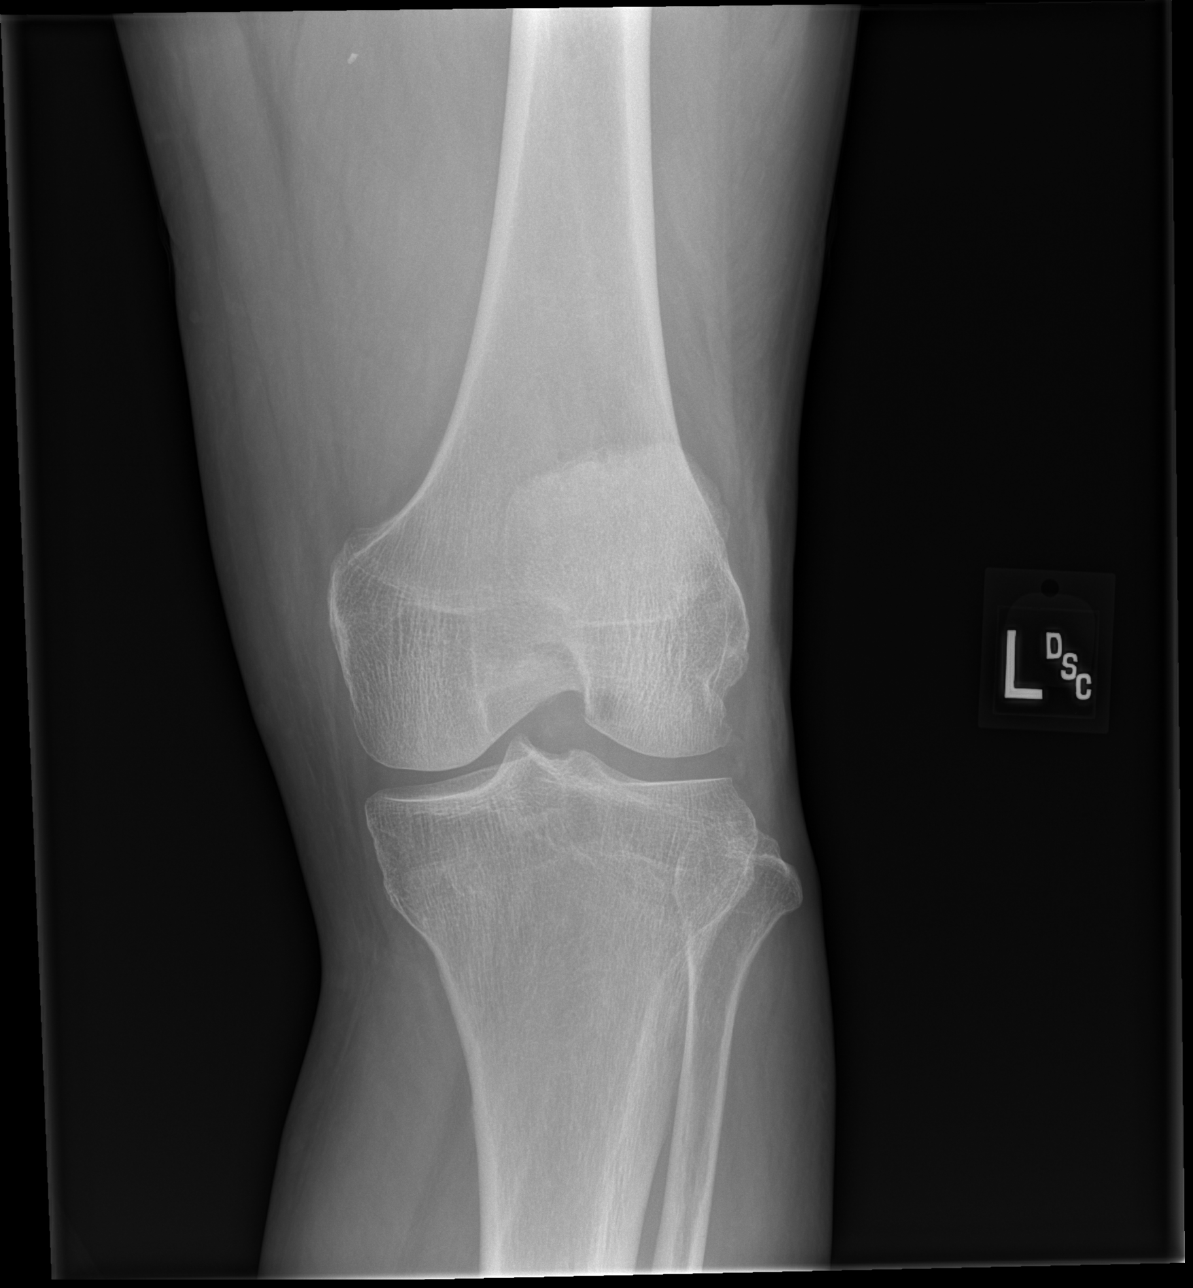

[x knee lat left]
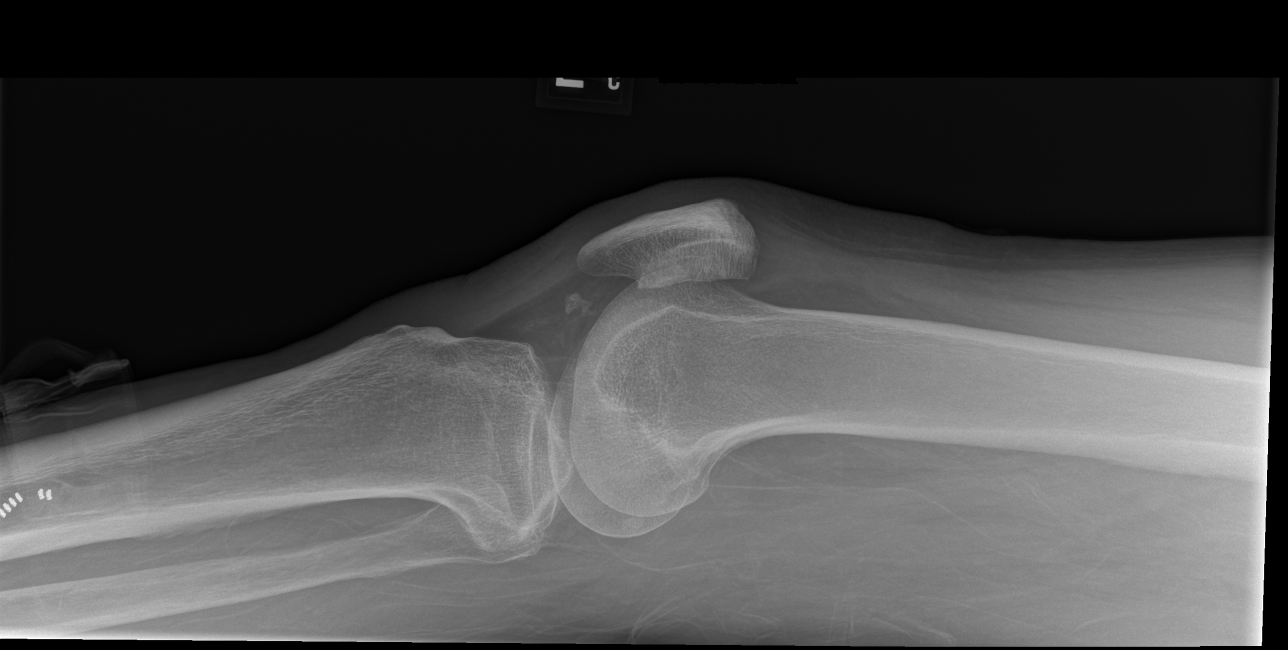

[4 of 4 positions shown; findings below may reference images not displayed]

FINDINGS: The joint spaces are maintained. No acute fracture. No joint
effusion.
IMPRESSION: No acute bony findings or joint effusion.

## 2022-10-08 IMAGING — CR DG RIBS W/ CHEST 3+V*L*
3 series · 3 of 3 positions shown · non-contrast
Comparison: 03/18/2017

CLINICAL DATA: Fall.  Pain

EXAM:
LEFT RIBS AND CHEST - 3+ VIEW

[x chest ap (1 of 3)]
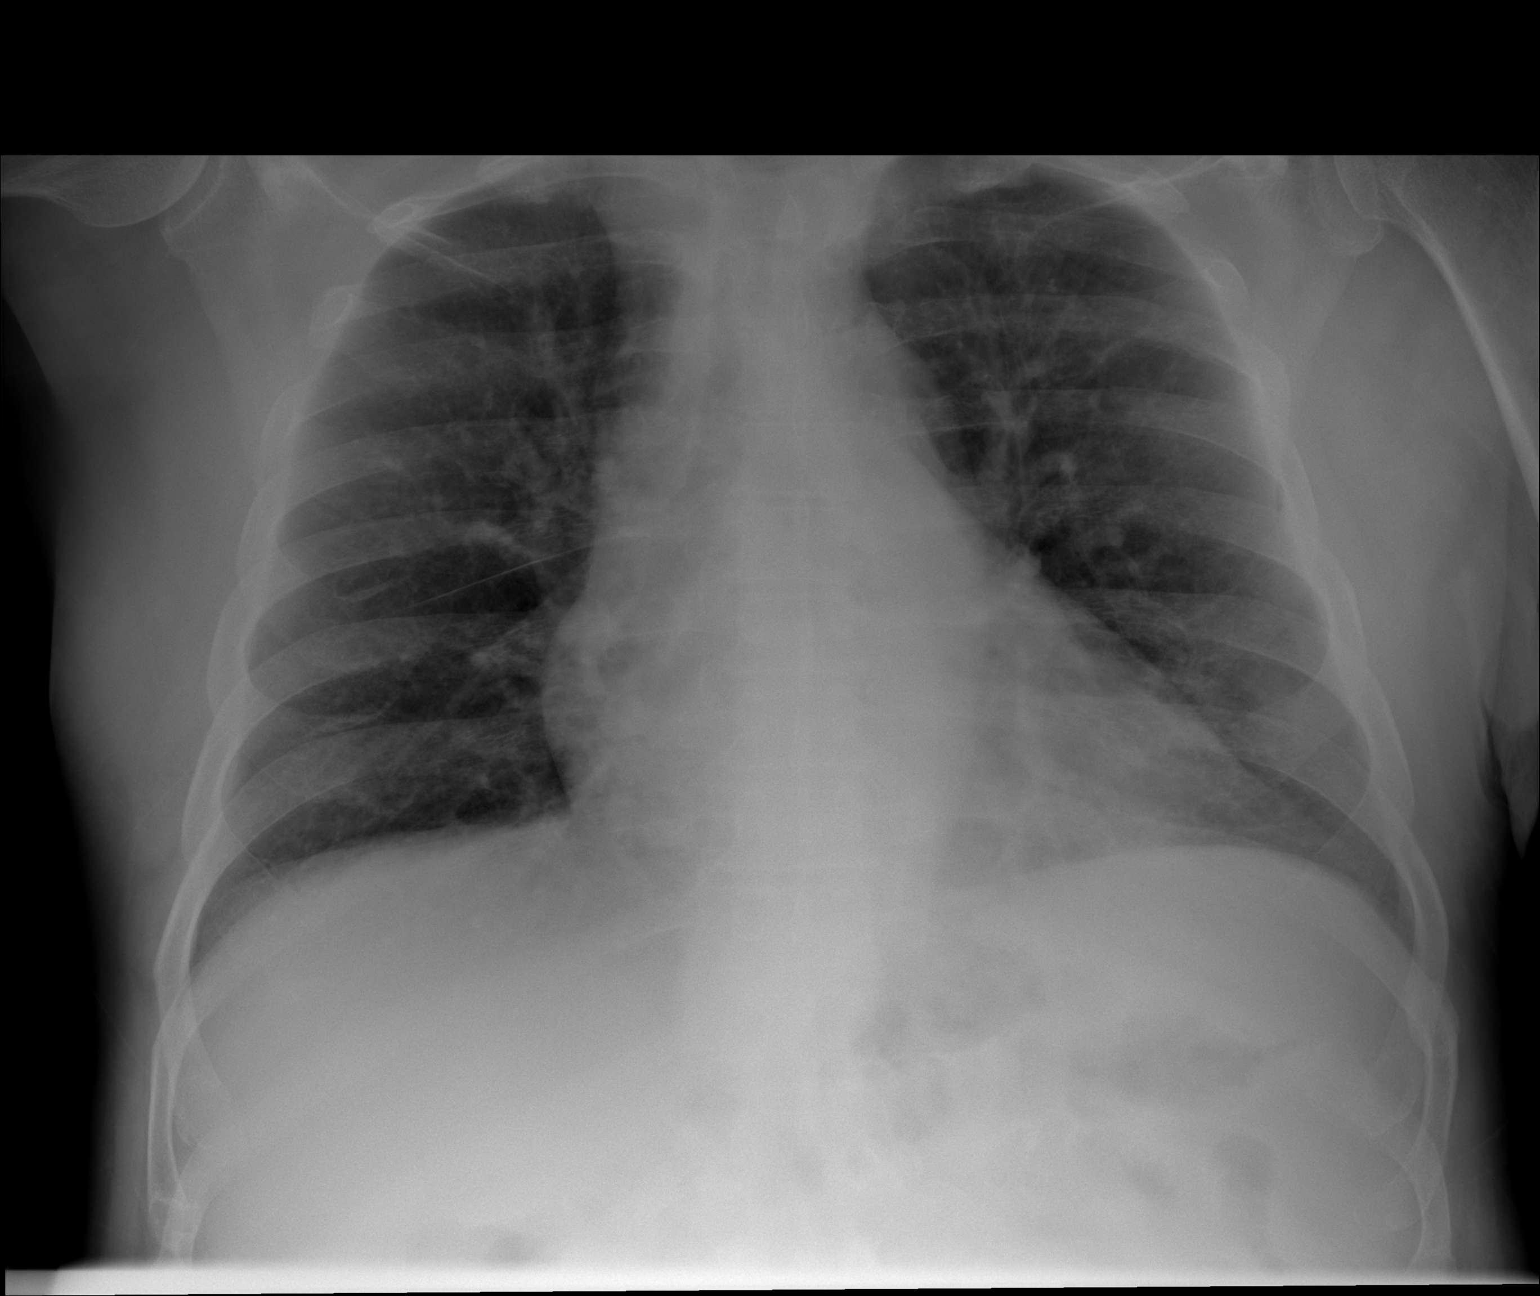

[x chest ap (2 of 3)]
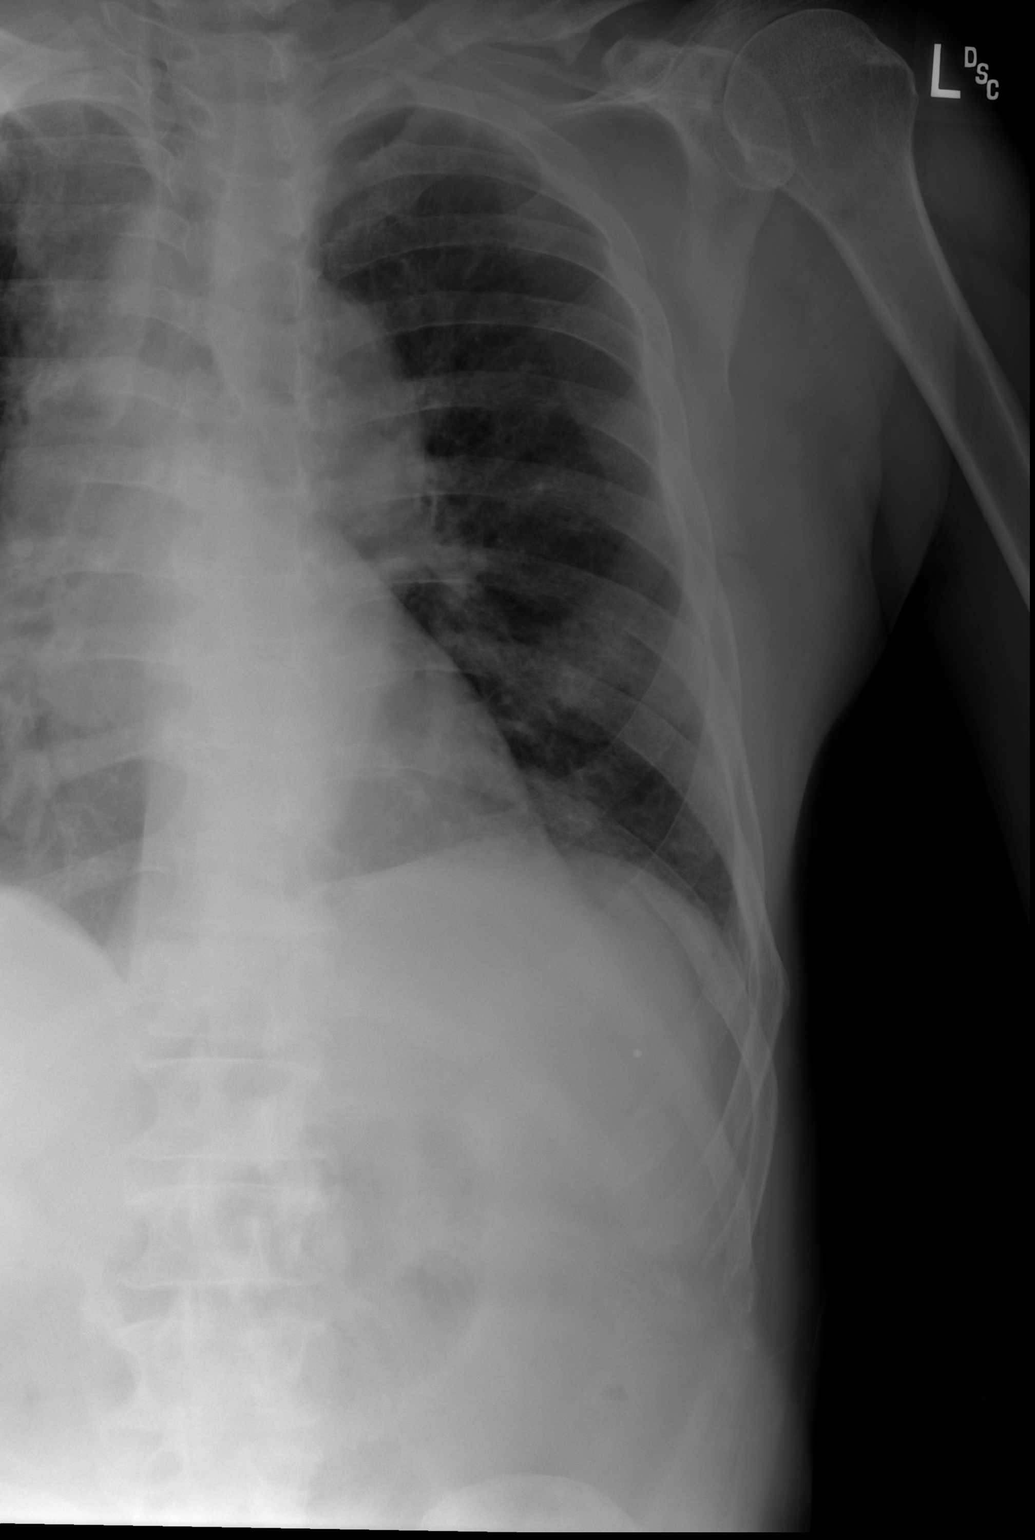

[x chest ap (3 of 3)]
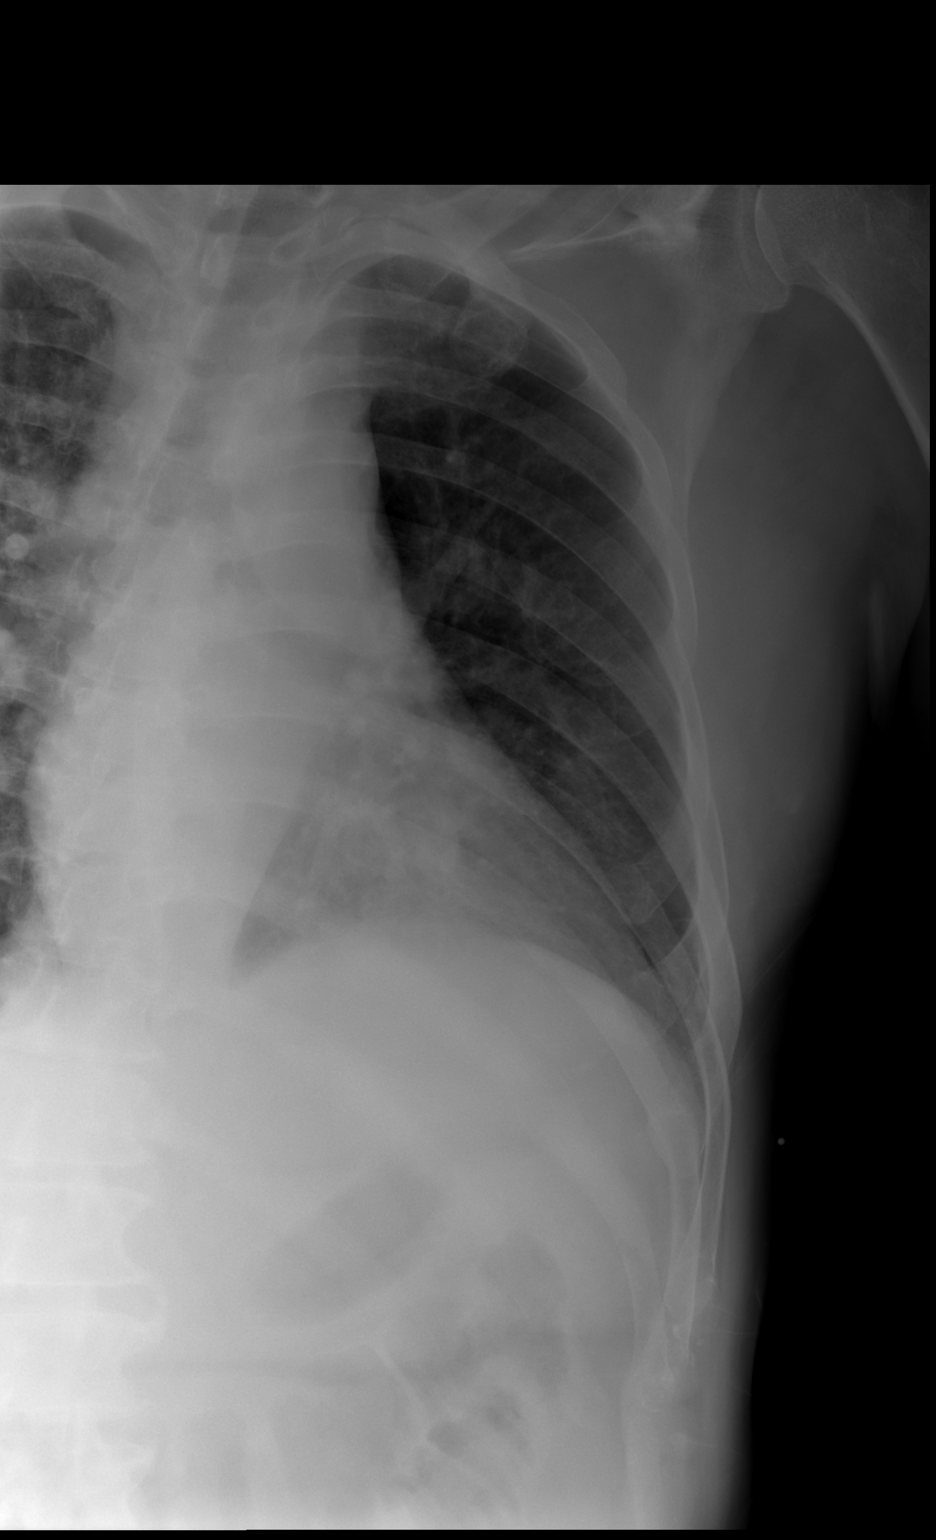

[3 of 3 positions shown; findings below may reference images not displayed]

FINDINGS: Stable cardiomediastinal contours. There are coarsened interstitial
markings identified bilaterally. No airspace opacities. Two chronic
appearing fractures noted involving the posterolateral aspect of the
left ninth and tenth ribs. No acute displaced rib fractures
identified.
IMPRESSION: 1. No acute cardiopulmonary abnormalities.
2. Chronic appearing fractures noted involving the posterolateral
aspect of the left ninth and tenth ribs.

## 2022-11-30 ENCOUNTER — Emergency Department (HOSPITAL_COMMUNITY)
Admission: EM | Admit: 2022-11-30 | Discharge: 2022-12-01 | Disposition: A | Discharge status: Home / Self Care | Payer: Self-pay | Attending: Emergency Medicine | Admitting: Emergency Medicine

## 2022-11-30 ENCOUNTER — Emergency Department (HOSPITAL_COMMUNITY): Payer: Self-pay

## 2022-11-30 DIAGNOSIS — W01198A Fall on same level from slipping, tripping and stumbling with subsequent striking against other object, initial encounter: Secondary | ICD-10-CM | POA: Insufficient documentation

## 2022-11-30 DIAGNOSIS — Z79899 Other long term (current) drug therapy: Secondary | ICD-10-CM | POA: Insufficient documentation

## 2022-11-30 DIAGNOSIS — S0101XA Laceration without foreign body of scalp, initial encounter: Secondary | ICD-10-CM | POA: Insufficient documentation

## 2022-11-30 DIAGNOSIS — Z23 Encounter for immunization: Secondary | ICD-10-CM | POA: Insufficient documentation

## 2022-11-30 DIAGNOSIS — S0990XA Unspecified injury of head, initial encounter: Secondary | ICD-10-CM

## 2022-11-30 DIAGNOSIS — S065X0A Traumatic subdural hemorrhage without loss of consciousness, initial encounter: Secondary | ICD-10-CM | POA: Insufficient documentation

## 2022-11-30 DIAGNOSIS — I6200 Nontraumatic subdural hemorrhage, unspecified: Secondary | ICD-10-CM

## 2022-11-30 DIAGNOSIS — Y908 Blood alcohol level of 240 mg/100 ml or more: Secondary | ICD-10-CM | POA: Insufficient documentation

## 2022-11-30 DIAGNOSIS — Y92521 Bus station as the place of occurrence of the external cause: Secondary | ICD-10-CM | POA: Insufficient documentation

## 2022-11-30 DIAGNOSIS — N3000 Acute cystitis without hematuria: Secondary | ICD-10-CM | POA: Insufficient documentation

## 2022-11-30 LAB — BASIC METABOLIC PANEL
Anion gap: 8 (ref 5–15)
BUN: <5 mg/dL — ABNORMAL LOW (ref 8–23)
CO2: 25 mmol/L (ref 22–32)
Calcium: 8.2 mg/dL — ABNORMAL LOW (ref 8.9–10.3)
Chloride: 106 mmol/L (ref 98–111)
Creatinine, Ser: 0.56 mg/dL — ABNORMAL LOW (ref 0.61–1.24)
GFR, Estimated: >60 mL/min (ref >60)
Glucose, Bld: 97 mg/dL (ref 70–99)
Potassium: 3.6 mmol/L (ref 3.5–5.1)
Sodium: 139 mmol/L (ref 135–145)

## 2022-11-30 LAB — CBC WITH DIFFERENTIAL/PLATELET
Abs Immature Granulocytes: 0.01 10*3/uL (ref 0.00–0.07)
Basophils Absolute: 0.1 10*3/uL (ref 0.0–0.1)
Basophils Relative: 2 %
Eosinophils Absolute: 0.3 10*3/uL (ref 0.0–0.5)
Eosinophils Relative: 5 %
HCT: 39.3 % (ref 39.0–52.0)
Hemoglobin: 13.7 g/dL (ref 13.0–17.0)
Immature Granulocytes: 0 %
Lymphocytes Relative: 42 %
Lymphs Abs: 2.8 10*3/uL (ref 0.7–4.0)
MCH: 35.5 pg — ABNORMAL HIGH (ref 26.0–34.0)
MCHC: 34.9 g/dL (ref 30.0–36.0)
MCV: 101.8 fL — ABNORMAL HIGH (ref 80.0–100.0)
Monocytes Absolute: 0.7 10*3/uL (ref 0.1–1.0)
Monocytes Relative: 11 %
Neutro Abs: 2.6 10*3/uL (ref 1.7–7.7)
Neutrophils Relative %: 40 %
Platelets: 250 10*3/uL (ref 150–400)
RBC: 3.86 MIL/uL — ABNORMAL LOW (ref 4.22–5.81)
RDW: 11.9 % (ref 11.5–15.5)
WBC: 6.4 10*3/uL (ref 4.0–10.5)
nRBC: 0 % (ref 0.0–0.2)

## 2022-11-30 LAB — ETHANOL: Alcohol, Ethyl (B): 356 mg/dL (ref <10)

## 2022-11-30 LAB — PROTIME-INR
INR: 1 (ref 0.8–1.2)
Prothrombin Time: 13.1 seconds (ref 11.4–15.2)

## 2022-11-30 NOTE — ED Provider Triage Note (Signed)
Emergency Medicine Provider Triage Evaluation Note  Joseph Bell , a 61 y.o. male  was evaluated in triage.  Pt complains of Fall.  States he was at the bus stop when he had mechanical fall.  Admits to drinking EtOH.  Laceration posterior head.  Denies any LOC, anticoagulation.  Appears intoxicated.  Review of Systems  Positive: Fall Negative:   Physical Exam  BP (!) 152/91 (BP Location: Right Arm)   Pulse 88   Resp 17   SpO2 96%  Gen:   Awake, no distress   Head:  Left head lac Resp:  Normal effort  MSK:   Moves extremities without difficulty  Other:  Appears intoxicated  Medical Decision Making  Medically screening exam initiated at 10:07 PM.  Appropriate orders placed.  Joseph Bell was informed that the remainder of the evaluation will be completed by another provider, this initial triage assessment does not replace that evaluation, and the importance of remaining in the ED until their evaluation is complete.  Discussed with radiology patient with head bleed  Charge nurse aware patient needs room in back   Joseph Bell A, PA-C 11/30/22 2209

## 2022-11-30 NOTE — ED Provider Notes (Signed)
..  Laceration Repair  Date/Time: 11/30/2022 11:40 PM  Performed by: Netta Corrigan, PA-C Authorized by: Netta Corrigan, PA-C   Consent:    Consent obtained:  Verbal   Consent given by:  Patient   Risks, benefits, and alternatives were discussed: yes   Laceration details:    Location:  Scalp   Scalp location:  Occipital   Length (cm):  3   Depth (mm):  3 Pre-procedure details:    Preparation:  Patient was prepped and draped in usual sterile fashion Exploration:    Imaging obtained: x-ray     Imaging outcome: foreign body not noted     Wound exploration: entire depth of wound visualized   Treatment:    Area cleansed with:  Saline   Amount of cleaning:  Extensive   Irrigation solution:  Sterile saline   Irrigation volume:  1000 mL   Visualized foreign bodies/material removed: no     Debridement:  None Skin repair:    Repair method:  Staples   Number of staples:  3 Approximation:    Approximation:  Loose Post-procedure details:    Dressing:  Bulky dressing   Procedure completion:  Tami Lin, PA-C 11/30/22 2346    Nira Conn, MD 12/01/22 [phone]

## 2022-11-30 NOTE — ED Triage Notes (Addendum)
Pt from home- ETOH. Fell hit back of head. Laceration. Unknown LOC. He removed collar himself and VSS. CBG 103
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
## 2022-12-01 LAB — URINALYSIS, ROUTINE W REFLEX MICROSCOPIC
Bilirubin Urine: NEGATIVE
Glucose, UA: NEGATIVE mg/dL
Ketones, ur: NEGATIVE mg/dL
Nitrite: POSITIVE — AB
Protein, ur: NEGATIVE mg/dL
Specific Gravity, Urine: 1.009 (ref 1.005–1.030)
pH: 5 (ref 5.0–8.0)

## 2022-12-01 LAB — RAPID URINE DRUG SCREEN, HOSP PERFORMED
Amphetamines: NOT DETECTED
Barbiturates: NOT DETECTED
Benzodiazepines: NOT DETECTED
Cocaine: NOT DETECTED
Opiates: NOT DETECTED
Tetrahydrocannabinol: NOT DETECTED

## 2022-12-01 MED ORDER — TETANUS-DIPHTH-ACELL PERTUSSIS 5-2.5-18.5 LF-MCG/0.5 IM SUSY
0.5000 mL | PREFILLED_SYRINGE | Freq: Once | INTRAMUSCULAR | Status: AC
  Administered 2022-12-01: 0.5 mL via INTRAMUSCULAR
  Filled 2022-12-01: qty 0.5

## 2022-12-01 MED ORDER — CEPHALEXIN 500 MG PO CAPS: 500.0000 mg | ORAL_CAPSULE | Freq: Two times a day (BID) | ORAL | 0 refills | Status: AC

## 2022-12-01 NOTE — Discharge Instructions (Signed)
Small laceration received 3 staples, please refrain from getting wet for today, starting tomorrow please rinse out the wound keep the area clean do this twice daily.  May use over-the-counter pain medication as needed.   Follow-up next 7-10 days for suture removal either at this department urgent care or your primary care provider.  I also want to follow-up with community health and wellness for reevaluation of your UTI.  If you develop the worst headache of your life have uncontrolled nausea vomiting feel more off balance, or simply your symptoms are getting worse please come back in for reassessment.

## 2022-12-03 LAB — URINE CULTURE: Culture: 70000 — AB

## 2022-12-04 ENCOUNTER — Telehealth (HOSPITAL_BASED_OUTPATIENT_CLINIC_OR_DEPARTMENT_OTHER): Payer: Self-pay | Admitting: *Deleted

## 2022-12-04 NOTE — Telephone Encounter (Signed)
Post ED Visit - Positive Culture Follow-up  Culture report reviewed by antimicrobial stewardship pharmacist: [facility] []  , Pharm.D. []  Enzo Bi, Pharm.D., BCPS AQ-ID []  , Pharm.D., BCPS []  Celedonio Miyamoto, Pharm.D., BCPS []  [place], Garvin Fila.D., BCPS, AAHIVP []  , Pharm.D., BCPS, AAHIVP []  Georgina Pillion, PharmD, BCPS []  , PharmD, BCPS []  [place], PharmD, BCPS []  [address], PharmD []  , PharmD, BCPS [x] Estella Husk, PharmD  Pharmacy Team []  Lysle Pearl, PharmD []  , PharmD []  Phillips Climes, PharmD []  , Rph []  Agapito Games) , PharmD []  Verlan Friends, PharmD []  , PharmD []  Mervyn Gay, PharmD []  , PharmD []  Doreen Beam, PharmD []  [facility], PharmD []  , PharmD []  Len Childs, PharmD   Positive urine culture Treated with Cephalexin, organism sensitive to the same and no further patient follow-up is required at this time.  12/04/2022, 1:01 PM

## 2023-01-14 ENCOUNTER — Emergency Department (HOSPITAL_COMMUNITY)
Admission: EM | Admit: 2023-01-14 | Discharge: 2023-01-14 | Disposition: A | Discharge status: Home / Self Care | Payer: Self-pay | Attending: Emergency Medicine | Admitting: Emergency Medicine

## 2023-01-14 ENCOUNTER — Encounter (HOSPITAL_COMMUNITY): Payer: Self-pay

## 2023-01-14 DIAGNOSIS — Y92410 Unspecified street and highway as the place of occurrence of the external cause: Secondary | ICD-10-CM | POA: Insufficient documentation

## 2023-01-14 DIAGNOSIS — M7989 Other specified soft tissue disorders: Secondary | ICD-10-CM | POA: Insufficient documentation

## 2023-01-14 DIAGNOSIS — W010XXA Fall on same level from slipping, tripping and stumbling without subsequent striking against object, initial encounter: Secondary | ICD-10-CM | POA: Insufficient documentation

## 2023-01-14 DIAGNOSIS — Z4802 Encounter for removal of sutures: Secondary | ICD-10-CM | POA: Insufficient documentation

## 2023-01-14 MED ORDER — DOXYCYCLINE HYCLATE 100 MG PO TABS
100.0000 mg | ORAL_TABLET | Freq: Once | ORAL | Status: AC
  Administered 2023-01-14: 100 mg via ORAL
  Filled 2023-01-14: qty 1

## 2023-01-14 MED ORDER — DOXYCYCLINE HYCLATE 100 MG PO CAPS: 100.0000 mg | ORAL_CAPSULE | Freq: Two times a day (BID) | ORAL | 0 refills | Status: AC

## 2023-01-14 NOTE — ED Triage Notes (Signed)
Pt via [facility] from fall on [address], called by bystanders. Pt w no complaints.  Abrasions noted to face, pt states from previous fall. Pt using walker since

## 2023-01-14 NOTE — ED Provider Notes (Signed)
[facility] Provider Note   CSN: [id number] Arrival date & time: 01/14/23  1905     History  Chief Complaint  Patient presents with   Fall   Alcohol Intoxication    Joseph Bell is a 62 y.o. male.  The history is provided by the patient and medical records.  Fall  Alcohol Intoxication   62 y.o. M with hx alcohol abuse, presenting to the ED after a fall.  Patient states [place] [address] [place] he [place] a [place].  States he got his legs tangled up in his walker.  Denies head injury or LOC.  He denies any acute complaints from the fall today, however does want his legs looked as he does have some swelling/redness.  He denies any fever.  He is not diabetic.  He also needs staples taken out of his head from prior fall in December.  Home Medications Prior to Admission medications   Medication Sig Start Date End Date Taking? Authorizing Provider  doxycycline (VIBRAMYCIN) 100 MG capsule Take 1 capsule (100 mg total) by mouth 2 (two) times daily. 01/14/23  Yes Larene Pickett, PA-C  colchicine 0.6 MG tablet Take 1 tablet (0.6 mg total) by mouth daily. 06/27/21 07/27/21  Barb Merino, MD      Allergies    Patient has no known allergies.    Review of Systems   Review of Systems  Skin:  Positive for color change.  All other systems reviewed and are negative.   Physical Exam Updated Vital Signs BP 115/62 (BP Location: Right Arm)   Pulse 88   Temp 98.6 F (37 C) (Oral)   Resp 19   SpO2 98%   Physical Exam Vitals and nursing note reviewed.  Constitutional:      Appearance: He is well-developed.  HENT:     Head: Normocephalic and atraumatic.     Comments: Large abrasion noted to left side of forehead that is scabbed over and appears old, there is no evidence of active bleeding, no scalp hematoma or skull depression 3 staples noted in occipital scalp from prior wound Eyes:     Conjunctiva/sclera:  Conjunctivae normal.     Pupils: Pupils are equal, round, and reactive to light.  Cardiovascular:     Rate and Rhythm: Normal rate and regular rhythm.     Heart sounds: Normal heart sounds.  Pulmonary:     Effort: Pulmonary effort is normal.     Breath sounds: Normal breath sounds.  Abdominal:     General: Bowel sounds are normal.     Palpations: Abdomen is soft.  Musculoskeletal:        General: Normal range of motion.     Cervical back: Normal range of motion.     Comments: 1+ pitting edema and mild erythema of bilateral lower extremities, grossly symmetric, no calf tenderness or palpable cords, no open wounds/ulcers, no tissue crepitus, legs neurovascularly intact, remains ambulatory at bedside  Skin:    General: Skin is warm and dry.  Neurological:     Mental Status: He is alert and oriented to person, place, and time.     Comments: AAOx3, answering questions and following commands, moving extremities without difficulty, remains ambulatory     ED Results / Procedures / Treatments   Labs (all labs ordered are listed, but only abnormal results are displayed) Labs Reviewed - No data to display  EKG None  Radiology No results found.  Procedures .Suture Removal  Date/Time: 01/14/2023 10:28 PM  Performed by: Larene Pickett, PA-C Authorized by: Larene Pickett, PA-C   Consent:    Consent obtained:  Verbal   Consent given by:  Patient   Risks, benefits, and alternatives were discussed: yes     Risks discussed:  Bleeding   Alternatives discussed:  No treatment Universal protocol:    Procedure explained and questions answered to patient or proxy's satisfaction: yes     Patient identity confirmed:  Verbally with patient and hospital-assigned identification number Location:    Location:  [place] location:  Scalp Procedure details:    Wound appearance:  No signs of infection   Number of staples removed:  3 Post-procedure details:    Post-removal:  No  dressing applied   Procedure completion:  Tolerated     Medications Ordered in ED Medications  doxycycline (VIBRA-TABS) tablet 100 mg (has no administration in time range)    ED Course/ Medical Decision Making/ A&P                             Medical Decision Making Risk Prescription drug management.   62 year old male presenting to the ED after a fall.  Bystanders called EMS.  He has no acute complaints.  He is awake, alert, oriented.  He does have abrasions noted to the face, however these are scabbed over and appear old.  He admits they were from a prior fall last month.  He denies any injuries or pain from the fall.  He does request that his legs be examined.  He does appear to have some peripheral edema and erythema.  I do not appreciate any ulcerations, open wounds, or tissue crepitus.  He remains ambulatory.  I suspect he may be developing some cellulitis.  He does not have history of diabetes.  He has no fever, tachycardia, hypotension, or other clinical findings concerning for sepsis.  We will start on course of doxycycline.  Patient also requested staples be removed from his scalp.  Based on chart review, appears these were placed 11/30/2022 after a fall.  These were removed without difficulty, tolerated well.  He was instructed to follow-up closely with his primary care doctor.  He can return here for any new or acute changes.  Final Clinical Impression(s) / ED Diagnoses Final diagnoses:  Leg swelling  Removal of staples    Rx / DC Orders ED Discharge Orders          Ordered    doxycycline (VIBRAMYCIN) 100 MG capsule  2 times daily        01/14/23 2230              Larene Pickett, PA-C 01/14/23 2242    Leanord Asal K, DO 01/14/23 2312

## 2023-01-14 NOTE — Discharge Instructions (Signed)
Take the prescribed medication as directed.  Try to keep legs elevated to keep swelling down.  Can also consider wearing compression socks. Follow-up with your primary care doctor.   Return to the ED for new or worsening symptoms.

## 2023-02-06 ENCOUNTER — Emergency Department (HOSPITAL_COMMUNITY): Payer: Self-pay

## 2023-02-06 ENCOUNTER — Observation Stay (HOSPITAL_COMMUNITY): Payer: Self-pay

## 2023-02-06 ENCOUNTER — Inpatient Hospital Stay (HOSPITAL_COMMUNITY)
Admission: EM | Admit: 2023-02-06 | Discharge: 2023-02-16 | DRG: 603 | Disposition: A | Discharge status: Home / Self Care | Payer: Self-pay | Attending: Internal Medicine | Admitting: Internal Medicine

## 2023-02-06 DIAGNOSIS — E222 Syndrome of inappropriate secretion of antidiuretic hormone: Secondary | ICD-10-CM | POA: Diagnosis present

## 2023-02-06 DIAGNOSIS — D6959 Other secondary thrombocytopenia: Secondary | ICD-10-CM | POA: Diagnosis present

## 2023-02-06 DIAGNOSIS — L03115 Cellulitis of right lower limb: Secondary | ICD-10-CM | POA: Diagnosis present

## 2023-02-06 DIAGNOSIS — E0781 Sick-euthyroid syndrome: Secondary | ICD-10-CM | POA: Diagnosis present

## 2023-02-06 DIAGNOSIS — E440 Moderate protein-calorie malnutrition: Secondary | ICD-10-CM

## 2023-02-06 DIAGNOSIS — F10939 Alcohol use, unspecified with withdrawal, unspecified: Secondary | ICD-10-CM

## 2023-02-06 DIAGNOSIS — Z720 Tobacco use: Secondary | ICD-10-CM

## 2023-02-06 DIAGNOSIS — R000 Tachycardia, unspecified: Secondary | ICD-10-CM | POA: Diagnosis present

## 2023-02-06 DIAGNOSIS — R269 Unspecified abnormalities of gait and mobility: Secondary | ICD-10-CM | POA: Diagnosis present

## 2023-02-06 DIAGNOSIS — S301XXA Contusion of abdominal wall, initial encounter: Secondary | ICD-10-CM

## 2023-02-06 DIAGNOSIS — D61818 Other pancytopenia: Secondary | ICD-10-CM

## 2023-02-06 DIAGNOSIS — S20219A Contusion of unspecified front wall of thorax, initial encounter: Secondary | ICD-10-CM | POA: Diagnosis present

## 2023-02-06 DIAGNOSIS — W19XXXA Unspecified fall, initial encounter: Secondary | ICD-10-CM

## 2023-02-06 DIAGNOSIS — R0602 Shortness of breath: Secondary | ICD-10-CM | POA: Diagnosis present

## 2023-02-06 DIAGNOSIS — F10139 Alcohol abuse with withdrawal, unspecified: Secondary | ICD-10-CM | POA: Diagnosis present

## 2023-02-06 DIAGNOSIS — L03116 Cellulitis of left lower limb: Principal | ICD-10-CM | POA: Diagnosis present

## 2023-02-06 DIAGNOSIS — R197 Diarrhea, unspecified: Secondary | ICD-10-CM | POA: Diagnosis present

## 2023-02-06 DIAGNOSIS — F1010 Alcohol abuse, uncomplicated: Secondary | ICD-10-CM

## 2023-02-06 DIAGNOSIS — E638 Other specified nutritional deficiencies: Secondary | ICD-10-CM | POA: Diagnosis present

## 2023-02-06 DIAGNOSIS — R296 Repeated falls: Secondary | ICD-10-CM | POA: Diagnosis present

## 2023-02-06 DIAGNOSIS — Z1152 Encounter for screening for COVID-19: Secondary | ICD-10-CM

## 2023-02-06 DIAGNOSIS — R200 Anesthesia of skin: Secondary | ICD-10-CM | POA: Diagnosis present

## 2023-02-06 DIAGNOSIS — L0390 Cellulitis, unspecified: Secondary | ICD-10-CM | POA: Diagnosis present

## 2023-02-06 DIAGNOSIS — R5381 Other malaise: Secondary | ICD-10-CM

## 2023-02-06 DIAGNOSIS — E46 Unspecified protein-calorie malnutrition: Secondary | ICD-10-CM | POA: Diagnosis present

## 2023-02-06 DIAGNOSIS — R9431 Abnormal electrocardiogram [ECG] [EKG]: Secondary | ICD-10-CM

## 2023-02-06 DIAGNOSIS — E876 Hypokalemia: Principal | ICD-10-CM

## 2023-02-06 DIAGNOSIS — Z597 Insufficient social insurance and welfare support: Secondary | ICD-10-CM

## 2023-02-06 DIAGNOSIS — S81011A Laceration without foreign body, right knee, initial encounter: Secondary | ICD-10-CM | POA: Diagnosis present

## 2023-02-06 DIAGNOSIS — L249 Irritant contact dermatitis, unspecified cause: Secondary | ICD-10-CM | POA: Diagnosis present

## 2023-02-06 DIAGNOSIS — Y92009 Unspecified place in unspecified non-institutional (private) residence as the place of occurrence of the external cause: Secondary | ICD-10-CM

## 2023-02-06 DIAGNOSIS — T1490XA Injury, unspecified, initial encounter: Secondary | ICD-10-CM

## 2023-02-06 DIAGNOSIS — Z5900 Homelessness unspecified: Secondary | ICD-10-CM

## 2023-02-06 LAB — COMPREHENSIVE METABOLIC PANEL
ALT: 29 U/L (ref 0–44)
ALT: 45 U/L — ABNORMAL HIGH (ref 0–44)
AST: 81 U/L — ABNORMAL HIGH (ref 15–41)
AST: 131 U/L — ABNORMAL HIGH (ref 15–41)
Albumin: 3 g/dL — ABNORMAL LOW (ref 3.5–5.0)
Albumin: 2.8 g/dL — ABNORMAL LOW (ref 3.5–5.0)
Alkaline Phosphatase: 151 U/L — ABNORMAL HIGH (ref 38–126)
Alkaline Phosphatase: 62 U/L (ref 38–126)
Anion gap: 15 (ref 5–15)
Anion gap: 12 (ref 5–15)
BUN: 7 mg/dL — ABNORMAL LOW (ref 8–23)
BUN: 9 mg/dL (ref 8–23)
CO2: 21 mmol/L — ABNORMAL LOW (ref 22–32)
CO2: 22 mmol/L (ref 22–32)
Calcium: 8.1 mg/dL — ABNORMAL LOW (ref 8.9–10.3)
Calcium: 8 mg/dL — ABNORMAL LOW (ref 8.9–10.3)
Chloride: 95 mmol/L — ABNORMAL LOW (ref 98–111)
Chloride: 99 mmol/L (ref 98–111)
Creatinine, Ser: 0.95 mg/dL (ref 0.61–1.24)
Creatinine, Ser: 0.88 mg/dL (ref 0.61–1.24)
GFR, Estimated: >60 mL/min (ref >60)
GFR, Estimated: >60 mL/min (ref >60)
Glucose, Bld: 79 mg/dL (ref 70–99)
Glucose, Bld: 103 mg/dL — ABNORMAL HIGH (ref 70–99)
Potassium: 2.9 mmol/L — ABNORMAL LOW (ref 3.5–5.1)
Potassium: 3.7 mmol/L (ref 3.5–5.1)
Sodium: 131 mmol/L — ABNORMAL LOW (ref 135–145)
Sodium: 133 mmol/L — ABNORMAL LOW (ref 135–145)
Total Bilirubin: 1.6 mg/dL — ABNORMAL HIGH (ref 0.3–1.2)
Total Bilirubin: 0.7 mg/dL (ref 0.3–1.2)
Total Protein: 6.3 g/dL — ABNORMAL LOW (ref 6.5–8.1)
Total Protein: 6.3 g/dL — ABNORMAL LOW (ref 6.5–8.1)

## 2023-02-06 LAB — BLOOD GAS, VENOUS
Acid-Base Excess: 3.5 mmol/L — ABNORMAL HIGH (ref 0.0–2.0)
Bicarbonate: 29.1 mmol/L — ABNORMAL HIGH (ref 20.0–28.0)
Drawn by: 7335
O2 Saturation: 46.4 %
Patient temperature: 36.5
pCO2, Ven: 46 mmHg (ref 44–60)
pH, Ven: 7.41 (ref 7.25–7.43)
pO2, Ven: <31 mmHg — CL (ref 32–45)

## 2023-02-06 LAB — RETICULOCYTES
Immature Retic Fract: 8.2 % (ref 2.3–15.9)
RBC.: 3.34 MIL/uL — ABNORMAL LOW (ref 4.22–5.81)
Retic Count, Absolute: 50.1 10*3/uL (ref 19.0–186.0)
Retic Ct Pct: 1.5 % (ref 0.4–3.1)

## 2023-02-06 LAB — CBC
HCT: 35.3 % — ABNORMAL LOW (ref 39.0–52.0)
HCT: 35.2 % — ABNORMAL LOW (ref 39.0–52.0)
Hemoglobin: 12.6 g/dL — ABNORMAL LOW (ref 13.0–17.0)
Hemoglobin: 12 g/dL — ABNORMAL LOW (ref 13.0–17.0)
MCH: 35.8 pg — ABNORMAL HIGH (ref 26.0–34.0)
MCH: 35.6 pg — ABNORMAL HIGH (ref 26.0–34.0)
MCHC: 35.7 g/dL (ref 30.0–36.0)
MCHC: 34.1 g/dL (ref 30.0–36.0)
MCV: 100.3 fL — ABNORMAL HIGH (ref 80.0–100.0)
MCV: 104.5 fL — ABNORMAL HIGH (ref 80.0–100.0)
Platelets: 78 10*3/uL — ABNORMAL LOW (ref 150–400)
Platelets: 83 10*3/uL — ABNORMAL LOW (ref 150–400)
RBC: 3.52 MIL/uL — ABNORMAL LOW (ref 4.22–5.81)
RBC: 3.37 MIL/uL — ABNORMAL LOW (ref 4.22–5.81)
RDW: 12.6 % (ref 11.5–15.5)
RDW: 12.8 % (ref 11.5–15.5)
WBC: 3.8 10*3/uL — ABNORMAL LOW (ref 4.0–10.5)
WBC: 19.5 10*3/uL — ABNORMAL HIGH (ref 4.0–10.5)
nRBC: 0 % (ref 0.0–0.2)
nRBC: 0 % (ref 0.0–0.2)

## 2023-02-06 LAB — RESPIRATORY PANEL BY PCR

## 2023-02-06 LAB — PROCALCITONIN: Procalcitonin: 24.5 ng/mL

## 2023-02-06 LAB — IRON AND TIBC
Iron: 15 ug/dL — ABNORMAL LOW (ref 45–182)
Saturation Ratios: 7 % — ABNORMAL LOW (ref 17.9–39.5)
TIBC: 231 ug/dL — ABNORMAL LOW (ref 250–450)
UIBC: 216 ug/dL

## 2023-02-06 LAB — I-STAT VENOUS BLOOD GAS, ED
Acid-base deficit: 1 mmol/L (ref 0.0–2.0)
Bicarbonate: 23.4 mmol/L (ref 20.0–28.0)
Calcium, Ion: 1.04 mmol/L — ABNORMAL LOW (ref 1.15–1.40)
HCT: 35 % — ABNORMAL LOW (ref 39.0–52.0)
Hemoglobin: 11.9 g/dL — ABNORMAL LOW (ref 13.0–17.0)
O2 Saturation: 99 %
Potassium: 3.8 mmol/L (ref 3.5–5.1)
Sodium: 135 mmol/L (ref 135–145)
TCO2: 24 mmol/L (ref 22–32)
pCO2, Ven: 36.5 mmHg — ABNORMAL LOW (ref 44–60)
pH, Ven: 7.414 (ref 7.25–7.43)
pO2, Ven: 134 mmHg — ABNORMAL HIGH (ref 32–45)

## 2023-02-06 LAB — TSH: TSH: 3.039 u[IU]/mL (ref 0.350–4.500)

## 2023-02-06 LAB — I-STAT CHEM 8, ED
BUN: 7 mg/dL — ABNORMAL LOW (ref 8–23)
Calcium, Ion: 0.97 mmol/L — ABNORMAL LOW (ref 1.15–1.40)
Chloride: 95 mmol/L — ABNORMAL LOW (ref 98–111)
Creatinine, Ser: 0.8 mg/dL (ref 0.61–1.24)
Glucose, Bld: 86 mg/dL (ref 70–99)
HCT: 31 % — ABNORMAL LOW (ref 39.0–52.0)
Hemoglobin: 10.5 g/dL — ABNORMAL LOW (ref 13.0–17.0)
Potassium: 3 mmol/L — ABNORMAL LOW (ref 3.5–5.1)
Sodium: 132 mmol/L — ABNORMAL LOW (ref 135–145)
TCO2: 25 mmol/L (ref 22–32)

## 2023-02-06 LAB — MAGNESIUM
Magnesium: 1.3 mg/dL — ABNORMAL LOW (ref 1.7–2.4)
Magnesium: 2 mg/dL (ref 1.7–2.4)

## 2023-02-06 LAB — FERRITIN: Ferritin: 581 ng/mL — ABNORMAL HIGH (ref 24–336)

## 2023-02-06 LAB — CORTISOL: Cortisol, Plasma: 35.3 ug/dL

## 2023-02-06 LAB — BRAIN NATRIURETIC PEPTIDE: B Natriuretic Peptide: 139 pg/mL — ABNORMAL HIGH (ref 0.0–100.0)

## 2023-02-06 LAB — RESP PANEL BY RT-PCR (RSV, FLU A&B, COVID)  RVPGX2
Influenza A by PCR: NEGATIVE
Influenza B by PCR: NEGATIVE
Resp Syncytial Virus by PCR: NEGATIVE
SARS Coronavirus 2 by RT PCR: NEGATIVE

## 2023-02-06 LAB — LACTIC ACID, PLASMA: Lactic Acid, Venous: 3.6 mmol/L (ref 0.5–1.9)

## 2023-02-06 LAB — PROTIME-INR
INR: 1.2 (ref 0.8–1.2)
Prothrombin Time: 15.1 seconds (ref 11.4–15.2)

## 2023-02-06 LAB — FOLATE: Folate: 21.4 ng/mL (ref >5.9)

## 2023-02-06 LAB — ETHANOL: Alcohol, Ethyl (B): <10 mg/dL (ref <10)

## 2023-02-06 LAB — LIPASE, BLOOD: Lipase: 29 U/L (ref 11–51)

## 2023-02-06 LAB — OSMOLALITY: Osmolality: 286 mOsm/kg (ref 275–295)

## 2023-02-06 LAB — VITAMIN B12: Vitamin B-12: 211 pg/mL (ref 180–914)

## 2023-02-06 LAB — TROPONIN I (HIGH SENSITIVITY): Troponin I (High Sensitivity): 94 ng/L — ABNORMAL HIGH (ref <18)

## 2023-02-06 LAB — AMMONIA: Ammonia: 21 umol/L (ref 9–35)

## 2023-02-06 LAB — PHOSPHORUS: Phosphorus: 1.7 mg/dL — ABNORMAL LOW (ref 2.5–4.6)

## 2023-02-06 LAB — CK: Total CK: 216 U/L (ref 49–397)

## 2023-02-06 MED ORDER — IOHEXOL 350 MG/ML SOLN
75.0000 mL | Freq: Once | INTRAVENOUS | Status: AC | PRN
  Administered 2023-02-06: 75 mL via INTRAVENOUS

## 2023-02-06 MED ORDER — VANCOMYCIN HCL 1750 MG/350ML IV SOLN
1750.0000 mg | Freq: Once | INTRAVENOUS | Status: AC
  Administered 2023-02-07: 1750 mg via INTRAVENOUS
  Filled 2023-02-06 (×2): qty 350

## 2023-02-06 MED ORDER — FOLIC ACID 1 MG PO TABS
1.0000 mg | ORAL_TABLET | Freq: Every day | ORAL | Status: DC
  Administered 2023-02-06 – 2023-02-16 (×11): 1 mg via ORAL
  Filled 2023-02-06 (×11): qty 1

## 2023-02-06 MED ORDER — ONDANSETRON HCL 4 MG/2ML IJ SOLN
4.0000 mg | Freq: Once | INTRAMUSCULAR | Status: AC
  Administered 2023-02-06: 4 mg via INTRAVENOUS
  Filled 2023-02-06: qty 2

## 2023-02-06 MED ORDER — POTASSIUM CHLORIDE 20 MEQ PO PACK
80.0000 meq | PACK | ORAL | Status: AC
  Administered 2023-02-06: 80 meq via ORAL
  Filled 2023-02-06: qty 4

## 2023-02-06 MED ORDER — THIAMINE MONONITRATE 100 MG PO TABS
100.0000 mg | ORAL_TABLET | Freq: Every day | ORAL | Status: DC
  Administered 2023-02-06 – 2023-02-16 (×11): 100 mg via ORAL
  Filled 2023-02-06 (×11): qty 1

## 2023-02-06 MED ORDER — THIAMINE HCL 100 MG/ML IJ SOLN
400.0000 mg | Freq: Once | INTRAVENOUS | Status: AC
  Administered 2023-02-06: 400 mg via INTRAVENOUS
  Filled 2023-02-06 (×2): qty 4

## 2023-02-06 MED ORDER — SODIUM CHLORIDE 0.9 % IV SOLN
100.0000 mg | Freq: Once | INTRAVENOUS | Status: AC
  Administered 2023-02-06: 100 mg via INTRAVENOUS
  Filled 2023-02-06: qty 100

## 2023-02-06 MED ORDER — LACTATED RINGERS IV BOLUS
1000.0000 mL | Freq: Once | INTRAVENOUS | Status: AC
  Administered 2023-02-06: 1000 mL via INTRAVENOUS

## 2023-02-06 MED ORDER — LORAZEPAM 1 MG PO TABS
0.0000 mg | ORAL_TABLET | Freq: Two times a day (BID) | ORAL | Status: AC
  Administered 2023-02-09: 1 mg via ORAL
  Filled 2023-02-06: qty 2
  Filled 2023-02-06: qty 1

## 2023-02-06 MED ORDER — POTASSIUM PHOSPHATES 15 MMOLE/5ML IV SOLN
15.0000 mmol | Freq: Once | INTRAVENOUS | Status: AC
  Administered 2023-02-06: 15 mmol via INTRAVENOUS
  Filled 2023-02-06: qty 5

## 2023-02-06 MED ORDER — LORAZEPAM 2 MG/ML IJ SOLN
1.0000 mg | INTRAMUSCULAR | Status: AC
  Administered 2023-02-06: 1 mg via INTRAVENOUS
  Filled 2023-02-06: qty 1

## 2023-02-06 MED ORDER — IPRATROPIUM-ALBUTEROL 0.5-2.5 (3) MG/3ML IN SOLN
3.0000 mL | Freq: Once | RESPIRATORY_TRACT | Status: AC
  Administered 2023-02-06: 3 mL via RESPIRATORY_TRACT
  Filled 2023-02-06: qty 3

## 2023-02-06 MED ORDER — NYSTATIN 100000 UNIT/GM EX POWD
Freq: Three times a day (TID) | CUTANEOUS | Status: DC
  Administered 2023-02-13 (×2): 1 via TOPICAL
  Filled 2023-02-06: qty 15

## 2023-02-06 MED ORDER — SODIUM CHLORIDE 0.9 % IV SOLN
2.0000 g | INTRAVENOUS | Status: AC
  Administered 2023-02-06: 2 g via INTRAVENOUS
  Filled 2023-02-06: qty 20

## 2023-02-06 MED ORDER — SODIUM CHLORIDE 0.9 % IV SOLN
2.0000 g | Freq: Three times a day (TID) | INTRAVENOUS | Status: DC
  Administered 2023-02-07: 2 g via INTRAVENOUS
  Filled 2023-02-06: qty 12.5

## 2023-02-06 MED ORDER — ADULT MULTIVITAMIN W/MINERALS CH
1.0000 | ORAL_TABLET | Freq: Every day | ORAL | Status: DC
  Administered 2023-02-06 – 2023-02-16 (×11): 1 via ORAL
  Filled 2023-02-06 (×11): qty 1

## 2023-02-06 MED ORDER — VANCOMYCIN HCL 750 MG/150ML IV SOLN: 750.0000 mg | Freq: Two times a day (BID) | INTRAVENOUS | Status: DC

## 2023-02-06 MED ORDER — LORAZEPAM 1 MG PO TABS
0.0000 mg | ORAL_TABLET | Freq: Four times a day (QID) | ORAL | Status: AC
  Administered 2023-02-06 – 2023-02-07 (×3): 1 mg via ORAL
  Administered 2023-02-08: 2 mg via ORAL
  Filled 2023-02-06 (×3): qty 1
  Filled 2023-02-06: qty 2

## 2023-02-06 MED ORDER — MAGNESIUM SULFATE 2 GM/50ML IV SOLN
2.0000 g | Freq: Once | INTRAVENOUS | Status: AC
  Administered 2023-02-06: 2 g via INTRAVENOUS
  Filled 2023-02-06: qty 50

## 2023-02-06 MED ORDER — THIAMINE HCL 100 MG/ML IJ SOLN: 100.0000 mg | Freq: Every day | INTRAMUSCULAR | Status: DC
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
## 2023-02-06 NOTE — Progress Notes (Signed)
TOC CSW consulted with pt.  Pt wasn't answering appropriately.  Sophy Mesler Tarpley-Carter, MSW, LCSW-A Pronouns:  She/Her/Hers [facility] HealthTransitions of Care Clinical Social Worker Direct Number:  [phone] [email or web address]

## 2023-02-06 NOTE — Assessment & Plan Note (Signed)
-   will monitor on tele avoid QT prolonging medications, rehydrate correct electrolytes  

## 2023-02-06 NOTE — Assessment & Plan Note (Signed)
-  admit per cellulitis protocol will     change to vancomycin/cefepime       plain films  ordered     Will obtain MRSA screening,    obtain blood cultures      further antibiotic adjustment pending above results

## 2023-02-06 NOTE — ED Notes (Signed)
ED TO INPATIENT HANDOFF REPORT  ED Nurse Name and Phone #: [phone] Graciella Freer Name/Age/Gender Joseph Bell 62 y.o. male Room/Bed: 025C/025C  Code Status   Code Status: Not on file  Home/SNF/Other Home Patient oriented to: self Is this baseline? Yes   Triage Complete: Triage complete  Chief Complaint Bilateral cellulitis of lower leg [L03.116, [id number]  Triage Note Pt BIB EMS from public housing after a call that someone fell. Pt was found in [place] where he lives with diarrhea covering the seat. SOB with exertion, RLS Rhonchi bilaterally, No obvious injuries. Bilateral lower legs cellulitis PT is a poor historian and difficult to answer questions. A&Ox4 at this time. Uses walker to ambulate.   EMS V/S BG96 28-30 RR BP 96/40 1st  HR 150 then to 130 99.28F ET 23 RA 98%  18 L FA 500LR bolus    Allergies Not on File  Level of Care/Admitting Diagnosis ED Disposition     ED Disposition  Admit   Condition  --   [place]: [place] [[id number]]  Level of Care: Progressive [102]  Admit to Progressive based on following criteria: MULTISYSTEM THREATS such as stable sepsis, metabolic/electrolyte imbalance with or without encephalopathy that is responding to early treatment.  May place patient in observation at [facility] or [facility] if equivalent level of care is available:: No  Covid Evaluation: Asymptomatic - no recent exposure (last 10 days) testing not required  Diagnosis: Bilateral cellulitis of lower leg [id number]  Admitting Physician: Toy Baker [[id number]]  Attending Physician: Toy Baker [[id number]]          B Medical/Surgery History No past medical history on file.     A IV Location/Drains/Wounds Patient Lines/Drains/Airways Status     Active Line/Drains/Airways     Name Placement date Placement time Site Days   Peripheral IV 02/06/23 18 G Posterior;Right Forearm 02/06/23  0935  Forearm  less than 1    Peripheral IV 02/06/23 20 G Anterior;Left Forearm 02/06/23  0905  Forearm  less than 1            Intake/Output Last 24 hours  Intake/Output Summary (Last 24 hours) at 02/06/2023 2038 Last data filed at 02/06/2023 1947 Gross per 24 hour  Intake 1378.67 ml  Output --  Net 1378.67 ml    Labs/Imaging Results for orders placed or performed during the hospital encounter of 02/06/23 (from the past 48 hour(s))  Comprehensive metabolic panel     Status: Abnormal   Collection Time: 02/06/23  9:30 AM  Result Value Ref Range   Sodium 131 (L) 135 - 145 mmol/L   Potassium 2.9 (L) 3.5 - 5.1 mmol/L   Chloride 95 (L) 98 - 111 mmol/L   CO2 21 (L) 22 - 32 mmol/L   Glucose, Bld 79 70 - 99 mg/dL    Comment: Glucose reference range applies only to samples taken after fasting for at least 8 hours.   BUN 7 (L) 8 - 23 mg/dL   Creatinine, Ser 0.95 0.61 - 1.24 mg/dL   Calcium 8.1 (L) 8.9 - 10.3 mg/dL   Total Protein 6.3 (L) 6.5 - 8.1 g/dL   Albumin 3.0 (L) 3.5 - 5.0 g/dL   AST 81 (H) 15 - 41 U/L   ALT 29 0 - 44 U/L   Alkaline Phosphatase 151 (H) 38 - 126 U/L   Total Bilirubin 1.6 (H) 0.3 - 1.2 mg/dL   GFR, Estimated >60 >60 mL/min  Comment: (NOTE) Calculated using the CKD-EPI Creatinine Equation (2021)    Anion gap 15 5 - 15    Comment: ELECTROLYTES REPEATED TO VERIFY Performed at [facility], [place] [address] [place], [place] [address]   CBC     Status: Abnormal   Collection Time: 02/06/23  9:30 AM  Result Value Ref Range   WBC 3.8 (L) 4.0 - 10.5 K/uL   RBC 3.52 (L) 4.22 - 5.81 MIL/uL   Hemoglobin 12.6 (L) 13.0 - 17.0 g/dL   HCT 35.3 (L) 39.0 - 52.0 %   MCV 100.3 (H) 80.0 - 100.0 fL   MCH 35.8 (H) 26.0 - 34.0 pg   MCHC 35.7 30.0 - 36.0 g/dL   RDW 12.6 11.5 - 15.5 %   Platelets 78 (L) 150 - 400 K/uL    Comment: Immature Platelet Fraction may be clinically indicated, consider ordering this additional test [id number] REPEATED TO VERIFY    nRBC 0.0 0.0 - 0.2 %    Comment:  Performed at [facility], [place] [address] [place], [place] [address]  Magnesium     Status: Abnormal   Collection Time: 02/06/23  9:30 AM  Result Value Ref Range   Magnesium 1.3 (L) 1.7 - 2.4 mg/dL    Comment: Performed at [facility] [address], [place], [place] [address]  CK     Status: None   Collection Time: 02/06/23  9:30 AM  Result Value Ref Range   Total CK 216 49 - 397 U/L    Comment: Performed at [facility], [place] [address] [place], [place] [address]  Phosphorus     Status: Abnormal   Collection Time: 02/06/23  9:30 AM  Result Value Ref Range   Phosphorus 1.7 (L) 2.5 - 4.6 mg/dL    Comment: Performed at [facility] [address] [place], [place] [address]  Brain natriuretic peptide     Status: Abnormal   Collection Time: 02/06/23  9:30 AM  Result Value Ref Range   B Natriuretic Peptide 139.0 (H) 0.0 - 100.0 pg/mL    Comment: Performed at [facility] [address] [place],  [address]  Resp panel by RT-PCR (RSV, Flu A&B, Covid) Anterior Nasal Swab     Status: None   Collection Time: 02/06/23  9:39 AM   Specimen: Anterior Nasal Swab  Result Value Ref Range   SARS Coronavirus 2 by RT PCR NEGATIVE NEGATIVE   Influenza A by PCR NEGATIVE NEGATIVE   Influenza B by PCR NEGATIVE NEGATIVE    Comment: (NOTE) The Xpert Xpress SARS-CoV-2/FLU/RSV plus assay is intended as an aid in the diagnosis of influenza from Nasopharyngeal swab specimens and should not be used as a sole basis for treatment. Nasal washings and aspirates are unacceptable for Xpert Xpress SARS-CoV-2/FLU/RSV testing.  Fact Sheet for Patients: EntrepreneurPulse.com.au  Fact Sheet for Healthcare Providers: IncredibleEmployment.be  This test is not yet approved or cleared by the [place] FDA and has been authorized for detection and/or diagnosis of SARS-CoV-2 by FDA under an Emergency Use Authorization (EUA). This EUA will  remain in effect (meaning this test can be used) for the duration of the COVID-19 declaration under Section 564(b)(1) of the Act, 21 U.S.C. section 360bbb-3(b)(1), unless the authorization is terminated or revoked.     Resp Syncytial Virus by PCR NEGATIVE NEGATIVE    Comment: (NOTE) Fact Sheet for Patients: EntrepreneurPulse.com.au  Fact Sheet for Healthcare Providers: IncredibleEmployment.be  This test is not yet approved or cleared by the [place] and has been authorized for detection and/or diagnosis of SARS-CoV-2 by FDA under an Emergency Use Authorization (EUA). This EUA will remain in effect (meaning this test can be used) for the duration of the COVID-19 declaration under Section 564(b)(1) of the Act, 21 U.S.C. section 360bbb-3(b)(1), unless the authorization is terminated or revoked.  Performed at [facility], [place] [address], [place], [place] [address]   I-Stat Chem 8, ED     Status: Abnormal   Collection Time: 02/06/23  1:33 PM  Result Value Ref Range   Sodium 132 (L) 135 - 145 mmol/L   Potassium 3.0 (L) 3.5 - 5.1 mmol/L   Chloride 95 (L) 98 - 111 mmol/L   BUN 7 (L) 8 - 23 mg/dL   Creatinine, Ser 0.80 0.61 - 1.24 mg/dL   Glucose, Bld 86 70 - 99 mg/dL    Comment: Glucose reference range applies only to samples taken after fasting for at least 8 hours.   Calcium, Ion 0.97 (L) 1.15 - 1.40 mmol/L   TCO2 25 22 - 32 mmol/L   Hemoglobin 10.5 (L) 13.0 - 17.0 g/dL   HCT 31.0 (L) 39.0 - 52.0 %  Protime-INR     Status: None   Collection Time: 02/06/23  7:52 PM  Result Value Ref Range   Prothrombin Time 15.1 11.4 - 15.2 seconds   INR 1.2 0.8 - 1.2    Comment: (NOTE) INR goal varies based on device and disease states. Performed at [facility], [place] [address] [place], [place] [address]    CT Cervical Spine Wo Contrast  Result Date: 02/06/2023 CLINICAL DATA:  Fall, scalp contusions. EXAM: CT CERVICAL SPINE  WITHOUT CONTRAST TECHNIQUE: Multidetector CT imaging of the cervical spine was performed without intravenous contrast. Multiplanar CT image reconstructions were also generated. RADIATION DOSE REDUCTION: This exam was performed according to the departmental dose-optimization program which includes automated exposure control, adjustment of the mA and/or kV according to patient size and/or use of iterative reconstruction technique. COMPARISON:  01/31/2022 FINDINGS: Despite efforts by the technologist and patient, motion artifact is present on today's exam and could not be eliminated. This reduces exam sensitivity and specificity. Alignment: Loss of the normal cervical lordosis, which can be associated with muscle spasm. Skull base and vertebrae: No acute fracture or acute bony findings. Soft tissues and spinal canal: Mild bilateral common carotid atherosclerotic vascular calcification. Disc levels: Moderate right and mild left foraminal stenosis at C5-6 due to disc bulge and uncinate spurring. Borderline bilateral foraminal stenosis at C6-7 likewise due to disc bulge and mild uncinate spurring. Upper chest: Unremarkable Other: The scout image demonstrates deformity compatible with healed fracture in the right humeral diaphysis and left midclavicle. IMPRESSION: 1. No acute cervical spine findings. 2. Loss of the normal cervical lordosis, which can be associated with muscle spasm. 3. Cervical spondylosis and degenerative disc disease causing mild-to-moderate foraminal impingement at C5-6 and borderline foraminal stenosis at C6-7. 4. Mild bilateral common carotid atherosclerotic vascular calcification. Electronically Signed   By: Van Clines M.D.   On: 02/06/2023 18:13   CT Head Wo Contrast  Result Date: 02/06/2023 CLINICAL DATA:  Head trauma, fall, history of subdural hematoma. EXAM: CT HEAD WITHOUT CONTRAST TECHNIQUE: Contiguous axial images were obtained from the base of the skull through the vertex without  intravenous contrast. RADIATION DOSE REDUCTION: This exam was performed according to the departmental dose-optimization program which includes automated  exposure control, adjustment of the mA and/or kV according to patient size and/or use of iterative reconstruction technique. COMPARISON:  11/30/2022 FINDINGS: Brain: Previous subdural hematoma along the right falx and tentorium has intervally cleared. The brainstem, cerebellum, cerebral peduncles, thalami, basal ganglia, basilar cisterns, and ventricular system appear within normal limits. No intracranial hemorrhage, mass lesion, or acute CVA. Vascular: Unremarkable Skull: Unremarkable Sinuses/Orbits: Old nasal bone and left zygomatic arch fractures. Chronic right maxillary sinusitis and mild chronic ethmoid sinusitis. Small mastoid effusions bilaterally. Other: Scalp hematomas along the bilateral parietal region, image 25 series 4. IMPRESSION: 1. No acute intracranial findings. Previous subdural hematoma along the right falx and tentorium has completely cleared. 2. Scalp hematomas along the bilateral parietal region. 3. 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 4. Chronic right maxillary sinusitis and mild chronic ethmoid sinusitis. Small mastoid effusions bilaterally. Electronically Signed   By: Van Clines M.D.   On: 02/06/2023 18:05   CT Angio Chest PE W and/or Wo Contrast  Result Date: 02/06/2023 CLINICAL DATA:  Fall, bruising on the right ribs. EXAM: CT ANGIOGRAPHY CHEST WITH CONTRAST TECHNIQUE: Multidetector CT imaging of the chest was performed using the standard protocol during bolus administration of intravenous contrast. Multiplanar CT image reconstructions and MIPs were obtained to evaluate the vascular anatomy. RADIATION DOSE REDUCTION: This exam was performed according to the departmental dose-optimization program which includes automated exposure control, adjustment of the mA and/or kV according to patient size and/or use of  iterative reconstruction technique. CONTRAST:  34m OMNIPAQUE IOHEXOL 350 MG/ML SOLN COMPARISON:  Chest radiograph 02/06/2023 FINDINGS: Cardiovascular: No filling defect is identified in the pulmonary arterial tree to suggest pulmonary embolus. Coronary, aortic arch, and branch vessel atherosclerotic vascular disease. Mild cardiomegaly. Mediastinum/Nodes: No pathologic adenopathy. Circumferential wall thickening in the distal half of the thoracic esophagus extending to the GE junction. This may be a manifestation of esophagitis although is technically nonspecific. Distal paraesophageal lymph node 0.5 cm in short axis on image 94 series 5, possibly reactive. Lungs/Pleura: Scattered mild linear subsegmental atelectasis or scarring. Airway thickening is present, suggesting bronchitis or reactive airways disease. There is frothy fluid in both the right and left mainstem bronchi. Mild dependent atelectasis in the left lower lobe. Nodularity associated with right upper lobe bandlike density on image 27 series 6, nodular component 9 by 7 by 9 mm (volume = 300 mm^3). Pleural-based nodular density in the left upper lobe just above the major fissure, 6 by 11 by 9 mm (volume = 300 mm^3) on image 40 of series 6. Upper Abdomen: Diffuse hepatic steatosis. Musculoskeletal: Mild midthoracic spondylosis. Review of the MIP images confirms the above findings. IMPRESSION: 1. No filling defect is identified in the pulmonary arterial tree to suggest pulmonary embolus. 2. Nodularity associated with right upper lobe bandlike density nodular component 9 by 7 by 9 mm (volume = 300 mm^3). Pleural-based nodular density in the left upper lobe just above the major fissure, 6 by 11 by 9 mm (volume = 300 mm^3). Per Fleischner Society Guidelines, consider a non-contrast Chest CT at 3 months, a PET/CT, or tissue sampling. These guidelines do not apply to immunocompromised patients and patients with cancer. Follow up in patients with significant  comorbidities as clinically warranted. For lung cancer screening, adhere to Lung-RADS guidelines. Reference: Radiology. 2017; 284(1):228-43. 3. Circumferential wall thickening in the distal half of the thoracic esophagus extending to the GE junction. This may be a manifestation of esophagitis although is technically nonspecific. 4. Airway thickening is present, suggesting  bronchitis or reactive airways disease. Frothy fluid in both the right and left mainstem bronchi. 5. Coronary, aortic arch, and branch vessel atherosclerotic vascular disease. Mild cardiomegaly. 6. Diffuse hepatic steatosis. 7. Aortic and coronary atherosclerosis. Aortic Atherosclerosis (ICD10-I70.0). Electronically Signed   By: Van Clines M.D.   On: 02/06/2023 18:01   DG Chest Port 1 View  Result Date: 02/06/2023 CLINICAL DATA:  Shortness of breath. EXAM: PORTABLE CHEST 1 VIEW COMPARISON:  None Available. FINDINGS: Diffuse interstitial prominence, which may reflect mild interstitial pulmonary edema versus atypical or viral infection. Normal heart size and mediastinal contours. No pleural effusion or pneumothorax. Old fracture deformity of the left clavicle IMPRESSION: Diffuse interstitial prominence, which may reflect mild interstitial pulmonary edema versus atypical or viral infection. Electronically Signed   By: Emmit Alexanders M.D.   On: 02/06/2023 09:59    Pending Labs Unresulted Labs (From admission, onward)     Start     Ordered   02/06/23 1843  Rapid urine drug screen (hospital performed)  ONCE - STAT,   STAT        02/06/23 1842   02/06/23 1842  Ammonia  Once,   STAT        02/06/23 1841            Vitals/Pain Today's Vitals   02/06/23 1600 02/06/23 1800 02/06/23 1950 02/06/23 2023  BP: 109/69 109/65 110/75 110/75  Pulse: (!) 107 (!) 125 (!) 108 (!) 108  Resp: '20 20 18   '$ Temp:  98.9 F (37.2 C)    TempSrc:      SpO2: 99% 94% 95%   PainSc:  0-No pain      Isolation Precautions No active  isolations  Medications Medications  thiamine (VITAMIN B1) tablet 100 mg (100 mg Oral Given 02/06/23 1203)    Or  thiamine (VITAMIN B1) injection 100 mg ( Intravenous See Alternative [id number])  folic acid (FOLVITE) tablet 1 mg (1 mg Oral Given 02/06/23 1203)  multivitamin with minerals tablet 1 tablet (1 tablet Oral Given 02/06/23 1203)  LORazepam (ATIVAN) tablet 0-4 mg (1 mg Oral Given 02/06/23 1742)    Followed by  LORazepam (ATIVAN) tablet 0-4 mg (has no administration in time range)  thiamine (VITAMIN B1) 400 mg in sodium chloride 0.9 % 50 mL IVPB (has no administration in time range)  lactated ringers bolus 1,000 mL (0 mLs Intravenous Stopped 02/06/23 1105)  magnesium sulfate IVPB 2 g 50 mL (0 g Intravenous Stopped 02/06/23 1214)  potassium chloride (KLOR-CON) packet 80 mEq (80 mEq Oral Given 02/06/23 1111)  LORazepam (ATIVAN) injection 1 mg (1 mg Intravenous Given 02/06/23 1435)  ondansetron (ZOFRAN) injection 4 mg (4 mg Intravenous Given 02/06/23 1435)  cefTRIAXone (ROCEPHIN) 2 g in sodium chloride 0.9 % 100 mL IVPB (0 g Intravenous Stopped 02/06/23 1824)  doxycycline (VIBRAMYCIN) 100 mg in sodium chloride 0.9 % 250 mL IVPB (0 mg Intravenous Stopped 02/06/23 1947)  lactated ringers bolus 1,000 mL (0 mLs Intravenous Stopped 02/06/23 1947)  iohexol (OMNIPAQUE) 350 MG/ML injection 75 mL (75 mLs Intravenous Contrast Given 02/06/23 1717)  ipratropium-albuterol (DUONEB) 0.5-2.5 (3) MG/3ML nebulizer solution 3 mL (3 mLs Nebulization Given 02/06/23 1854)    Mobility non-ambulatory     Focused Assessments Cardiac Assessment Handoff:  Cardiac Rhythm: Sinus tachycardia Lab Results  Component Value Date   CKTOTAL 216 02/06/2023   No results found for: "DDIMER" Does the Patient currently have chest pain? No   , Pulmonary Assessment Handoff:  Lung sounds: Bilateral  Breath Sounds: Inspiratory wheezes, Expiratory wheezes L Breath Sounds: Rhonchi R Breath Sounds: Rhonchi O2 Device: Room Air       R Recommendations: See Admitting Provider Note  Report given to:   Additional Notes: patient homeless, difficult to understand. Not ambulatory. Incontinent. Not oriented.

## 2023-02-06 NOTE — Assessment & Plan Note (Signed)
Order CIwa protocol

## 2023-02-06 NOTE — ED Notes (Signed)
Patient was found standing in his room next to stretcher with side rails up covered in feces. Patient cleaned up and placed back in bed without issue. This RN has requested Air cabin crew. Patient reeducated on the importance of staying in bed.

## 2023-02-06 NOTE — Assessment & Plan Note (Signed)
Replace and recheck

## 2023-02-06 NOTE — ED Notes (Addendum)
This RN requested sitter from charge RN for safety. MD placed order for safety sitter.

## 2023-02-06 NOTE — ED Provider Notes (Signed)
  Physical Exam  BP 109/65   Pulse (!) 125   Temp 98.9 F (37.2 C)   Resp 20   SpO2 94%   Physical Exam Constitutional:      Comments: Chronically ill-appearing but awake alert and responsive  Cardiovascular:     Rate and Rhythm: Tachycardia present.  Skin:    Comments: Erythema and cellulitic changes of bilateral lower extremities     Procedures  .Critical Care  Performed by: Elgie Congo, MD Authorized by: Elgie Congo, MD   Critical care provider statement:    Critical care time (minutes):  30   Critical care was necessary to treat or prevent imminent or life-threatening deterioration of the following conditions:  Metabolic crisis   Critical care was time spent personally by me on the following activities:  Development of treatment plan with patient or surrogate, discussions with consultants, evaluation of patient's response to treatment, examination of patient, ordering and review of laboratory studies, ordering and review of radiographic studies, ordering and performing treatments and interventions, pulse oximetry, re-evaluation of patient's condition and review of old charts   ED Course / MDM   Clinical Course as of 02/06/23 1901  Mon Feb 06, 2023  1635 Signed out to Dr Nechama Guard [RP]  1636 61 M h/o Etoh abuse and withdrawal, homelessness presenting with alcohol withdrawal and recent falls and diarrhea. Mg, K and Phos low, Qtc 639, chronic malnourisment. CXR withatypical infection getting Rocephin and doxy. Follow up CT head, CT C spine, CTA PE. Bruising right ribs. MRI brain due to worsening b/l LE weakness and more on right side.  Keep on CIWA. Admit.  [VB]  [id number] Did discuss case with Dr. Roel Cluck hospitalist for admission.  Requested additional labs which I have ordered for as well as additional IV thiamine injection.  She will manage admission. [VB]    Clinical Course User Index [RP] Fransico Meadow, MD [VB] Elgie Congo, MD   Medical Decision  Making Amount and/or Complexity of Data Reviewed Labs: ordered. Radiology: ordered.  Risk OTC drugs. Prescription drug management. Decision regarding hospitalization.          Elgie Congo, MD 02/06/23 [phone]

## 2023-02-06 NOTE — Assessment & Plan Note (Signed)
Given persistent falls will need further evaluation Unclear if his neurological deficits MRI brain pending and if abnormal will need neurology consult

## 2023-02-06 NOTE — H&P (Signed)
Joseph Bell [id number] DOB: 11-Feb-1961 DOA: 02/06/2023     PCP: Patient, No Pcp Per   Outpatient Specialists: * NONE CARDS: * Dr. NEphrology: *  Dr. NEurology *   Dr. Pulmonary *  Dr.  Oncology * Dr. Fabienne Bruns* Dr.  Sadie Haber, [facility]) No care team member to display Urology Dr. *  Patient arrived to ER on 02/06/23 at 0914 Referred by Attending Toy Baker, MD   Patient coming from:   Home less  Chief Complaint:   Chief Complaint  Patient presents with   Shortness of Breath   Diarrhea    HPI: Joseph Bell is a 62 y.o. male with medical history significant of  Alcohol abuse homelessness subdural hematoma in December 2023    Presented with fall Patient coming from public housing after multiple falls he lives currently in the [place] was found to be covered in feces and urine noted to be more short of breath on exertion With scabs on bilateral lower extremities rash in his groin area and a large bruise over his right flank Patient does not really cooperative answering question when asked what was the last time he drank he said at the bus stop but cannot really give me a date or time states he still smokes cigarettes but again not providing much more history beyond that   In emergency department he was found and perhaps he has lower extremity weakness he is not cooperative with exam but perhaps somewhat weak on the right   MRI head was ordered  Initial COVID TEST  NEGATIVE   Lab Results  Component Value Date   [place] 02/06/2023       While in ER: Clinical Course as of 02/06/23 2051  Mon Feb 06, 2023  1635 Signed out to Dr Nechama Guard [RP]  1636 89 M h/o Etoh abuse and withdrawal, homelessness presenting with alcohol withdrawal and recent falls and diarrhea. Mg, K and Phos low, Qtc 639, chronic malnourisment. CXR withatypical infection getting Rocephin and doxy. Follow up CT head, CT C spine, CTA PE. Bruising right ribs. MRI brain due to worsening b/l  LE weakness and more on right side.  Keep on CIWA. Admit.  [VB]  [id number] Did discuss case with Dr. Roel Cluck hospitalist for admission.  Requested additional labs which I have ordered for as well as additional IV thiamine injection.  She will manage admission. [VB]    Clinical Course User Index [RP] Fransico Meadow, MD [VB] Elgie Congo, MD    CT head nonacute previous subdural hematoma cleared   Ordered  CT HEAD   NON acute  CXR - Diffuse interstitial prominence, which may reflect mild interstitial pulmonary edema versus atypical or viral infection  CTabd/pelvis - ordered  CTA chest -   no PE, no evidence of infiltrate Pulmonary nodules Question esophagitis Possible bronchitis and reactive airway disease with frothy fluid in both right and left mainstem bronchi Mild cardiomegaly Diffuse hepatic steatosis  Following Medications were ordered in ER: Medications  thiamine (VITAMIN B1) tablet 100 mg (100 mg Oral Given 02/06/23 1203)    Or  thiamine (VITAMIN B1) injection 100 mg ( Intravenous See Alternative [id number])  folic acid (FOLVITE) tablet 1 mg (1 mg Oral Given 02/06/23 1203)  multivitamin with minerals tablet 1 tablet (1 tablet Oral Given 02/06/23 1203)  LORazepam (ATIVAN) tablet 0-4 mg (1 mg Oral Given 02/06/23 1742)    Followed by  LORazepam (ATIVAN) tablet 0-4 mg (has no administration in time  range)  thiamine (VITAMIN B1) 400 mg in sodium chloride 0.9 % 50 mL IVPB (has no administration in time range)  potassium PHOSPHATE 15 mmol in dextrose 5 % 250 mL infusion (has no administration in time range)  lactated ringers bolus 1,000 mL (0 mLs Intravenous Stopped 02/06/23 1105)  magnesium sulfate IVPB 2 g 50 mL (0 g Intravenous Stopped 02/06/23 1214)  potassium chloride (KLOR-CON) packet 80 mEq (80 mEq Oral Given 02/06/23 1111)  LORazepam (ATIVAN) injection 1 mg (1 mg Intravenous Given 02/06/23 1435)  ondansetron (ZOFRAN) injection 4 mg (4 mg Intravenous Given 02/06/23 1435)   cefTRIAXone (ROCEPHIN) 2 g in sodium chloride 0.9 % 100 mL IVPB (0 g Intravenous Stopped 02/06/23 1824)  doxycycline (VIBRAMYCIN) 100 mg in sodium chloride 0.9 % 250 mL IVPB (0 mg Intravenous Stopped 02/06/23 1947)  lactated ringers bolus 1,000 mL (0 mLs Intravenous Stopped 02/06/23 1947)  iohexol (OMNIPAQUE) 350 MG/ML injection 75 mL (75 mLs Intravenous Contrast Given 02/06/23 1717)  ipratropium-albuterol (DUONEB) 0.5-2.5 (3) MG/3ML nebulizer solution 3 mL (3 mLs Nebulization Given 02/06/23 1854)       ED Triage Vitals  Enc Vitals Group     BP 02/06/23 0945 94/60     Pulse Rate 02/06/23 0925 (!) 125     Resp 02/06/23 0925 (!) 30     Temp 02/06/23 0925 98.9 F (37.2 C)     Temp Source 02/06/23 0925 Oral     SpO2 02/06/23 0925 98 %     Weight --      Height --      Head Circumference --      Peak Flow --      Pain Score 02/06/23 0933 5     Pain Loc --      Pain Edu? --      Excl. in [place]? --   TMAX(24)@     _________________________________________ Significant initial  Findings: Abnormal Labs Reviewed  COMPREHENSIVE METABOLIC PANEL - Abnormal; Notable for the following components:      Result Value   Sodium 131 (*)    Potassium 2.9 (*)    Chloride 95 (*)    CO2 21 (*)    BUN 7 (*)    Calcium 8.1 (*)    Total Protein 6.3 (*)    Albumin 3.0 (*)    AST 81 (*)    Alkaline Phosphatase 151 (*)    Total Bilirubin 1.6 (*)    All other components within normal limits  CBC - Abnormal; Notable for the following components:   WBC 3.8 (*)    RBC 3.52 (*)    Hemoglobin 12.6 (*)    HCT 35.3 (*)    MCV 100.3 (*)    MCH 35.8 (*)    Platelets 78 (*)    All other components within normal limits  MAGNESIUM - Abnormal; Notable for the following components:   Magnesium 1.3 (*)    All other components within normal limits  PHOSPHORUS - Abnormal; Notable for the following components:   Phosphorus 1.7 (*)    All other components within normal limits  BRAIN NATRIURETIC PEPTIDE - Abnormal; Notable  for the following components:   B Natriuretic Peptide 139.0 (*)    All other components within normal limits  I-STAT CHEM 8, ED - Abnormal; Notable for the following components:   Sodium 132 (*)    Potassium 3.0 (*)    Chloride 95 (*)    BUN 7 (*)    Calcium, Ion 0.97 (*)  Hemoglobin 10.5 (*)    HCT 31.0 (*)    All other components within normal limits     _________________________ Troponin  ordered ECG: Ordered Personally reviewed and interpreted by me showing: HR : 123 Rhythm: Sinus tachycardia Borderline ST depression, diffuse leads Prolonged QT interval QTC 639   The recent clinical data is shown below. Vitals:   02/06/23 1600 02/06/23 1800 02/06/23 1950 02/06/23 2023  BP: 109/69 109/65 110/75 110/75  Pulse: (!) 107 (!) 125 (!) 108 (!) 108  Resp: '20 20 18   '$ Temp:  98.9 F (37.2 C)    TempSrc:      SpO2: 99% 94% 95%        WBC     Component Value Date/Time   WBC 3.8 (L) 02/06/2023 0930     Lactic Acid, Venous  Ordered  Procalcitonin   Ordered       UA  ordered    Results for orders placed or performed during the hospital encounter of 02/06/23  Resp panel by RT-PCR (RSV, Flu A&B, Covid) Anterior Nasal Swab     Status: None   Collection Time: 02/06/23  9:39 AM   Specimen: Anterior Nasal Swab  Result Value Ref Range Status   SARS Coronavirus 2 by RT PCR NEGATIVE NEGATIVE Final   Influenza A by PCR NEGATIVE NEGATIVE Final   Influenza B by PCR NEGATIVE NEGATIVE Final         Resp Syncytial Virus by PCR NEGATIVE NEGATIVE Final           _______________________________________________ Hospitalist was called for admission for   Hypokalemia  Hypomagnesemia  Hypophosphatasia  Alcohol withdrawal syndrome with complication ([place])      The following Work up has been ordered so far:  Orders Placed This Encounter  Procedures   Critical Care   Resp panel by RT-PCR (RSV, Flu A&B, Covid) Anterior Nasal Swab   Culture, blood (x 2)   DG Chest Port 1  View   CT Head Wo Contrast   CT Cervical Spine Wo Contrast   CT Angio Chest PE W and/or Wo Contrast   MR BRAIN WO CONTRAST   CT ABDOMEN PELVIS W CONTRAST   Comprehensive metabolic panel   CBC   Magnesium   CK   Phosphorus   Brain natriuretic peptide   Protime-INR   Ammonia   Rapid urine drug screen (hospital performed)   Comprehensive metabolic panel   CBC   Vitamin B12   Folate   Iron and TIBC   Ferritin   Reticulocytes   Magnesium   Lactic acid, plasma   Urinalysis, Complete w Microscopic -Urine, Clean Catch   Procalcitonin   Cortisol   Creatinine, urine, random   Osmolality   Osmolality, urine   Sodium, urine, random   TSH   Blood gas, venous   Cardiac monitoring   Check Peak Flow   Initiate Carrier Fluid Protocol   Vital signs every 6 hours X 48 hours, then per unit protocol   Refer to Sidebar Report for reference: [place] (CIWA)   If Ativan given, reassess [facility] Withdrawal Assessment (CIWA) q 1 hour   Notify Pharmacy to change IV Ativan to PO if tolerating POs well.   Notify physician (specify)   Safety Observation   Cardiac Monitoring - Continuous Indefinite   Apply Sepsis Care Plan   If lactate (lactic acid) >2, verify repeat lactic acid order has been placed to be drawn  Document vital signs within 1-hour of fluid bolus completion and notify provider of bolus completion   Vital signs   Vital signs   Assess and Document Glasgow Coma Scale   Cardiac Monitoring - Continuous Indefinite   Full code   Consult to Transition of Care Team   Consult to hospitalist   Pharmacy Consult   Nutritional services consult   Consult to Transition of Care Team   vancomycin per pharmacy consult   ceFEPime (MAXIPIME) per pharmacy consult            Pulse oximetry, continuous   I-Stat Chem 8, ED   ED EKG   EKG 12-Lead   Saline lock IV   Place in observation (patient's expected length of stay  will be less than 2 midnights)     OTHER Significant initial  Findings:  labs showing:    Recent Labs  Lab 02/06/23 0930 02/06/23 1333  NA 131* 132*  K 2.9* 3.0*  CO2 21*  --   GLUCOSE 79 86  BUN 7* 7*  CREATININE 0.95 0.80  CALCIUM 8.1*  --   MG 1.3*  --   PHOS 1.7*  --     Cr    stable,    Lab Results  Component Value Date   CREATININE 0.80 02/06/2023   CREATININE 0.95 02/06/2023    Recent Labs  Lab 02/06/23 0930  AST 81*  ALT 29  ALKPHOS 151*  BILITOT 1.6*  PROT 6.3*  ALBUMIN 3.0*   Lab Results  Component Value Date   CALCIUM 8.1 (L) 02/06/2023   PHOS 1.7 (L) 02/06/2023          Plt: Lab Results  Component Value Date   PLT 78 (L) 02/06/2023      COVID-19 Labs  No results for input(s): "DDIMER", "FERRITIN", "LDH", "CRP" in the last 72 hours.  Lab Results  Component Value Date   SARSCOV2NAA NEGATIVE 02/06/2023        Recent Labs  Lab 02/06/23 0930 02/06/23 1333  WBC 3.8*  --   HGB 12.6* 10.5*  HCT 35.3* 31.0*  MCV 100.3*  --   PLT 78*  --     HG/HCT  stable,       Component Value Date/Time   HGB 10.5 (L) 02/06/2023 1333   HCT 31.0 (L) 02/06/2023 1333   MCV 100.3 (H) 02/06/2023 0930      No results for input(s): "LIPASE", "AMYLASE" in the last 168 hours. Recent Labs  Lab 02/06/23 1952  AMMONIA 21      Cardiac Panel (last 3 results) Recent Labs    02/06/23 0930  CKTOTAL 216    .car BNP (last 3 results) Recent Labs    02/06/23 0930  BNP 139.0*      DM  labs:  HbA1C: No results for input(s): "HGBA1C" in the last 8760 hours.     CBG (last 3)  No results for input(s): "GLUCAP" in the last 72 hours.        Cultures: No results found for: "SDES", "SPECREQUEST", "CULT", "REPTSTATUS"   Radiological Exams on Admission: CT Cervical Spine Wo Contrast  Result Date: 02/06/2023 CLINICAL DATA:  Fall, scalp contusions. EXAM: CT CERVICAL SPINE WITHOUT CONTRAST TECHNIQUE: Multidetector CT imaging of the cervical  spine was performed without intravenous contrast. Multiplanar CT image reconstructions were also generated. RADIATION DOSE REDUCTION: This exam was performed according to the departmental dose-optimization program which includes automated exposure control, adjustment of the mA and/or kV according to patient  size and/or use of iterative reconstruction technique. COMPARISON:  01/31/2022 FINDINGS: Despite efforts by the technologist and patient, motion artifact is present on today's exam and could not be eliminated. This reduces exam sensitivity and specificity. Alignment: Loss of the normal cervical lordosis, which can be associated with muscle spasm. Skull base and vertebrae: No acute fracture or acute bony findings. Soft tissues and spinal canal: Mild bilateral common carotid atherosclerotic vascular calcification. Disc levels: Moderate right and mild left foraminal stenosis at C5-6 due to disc bulge and uncinate spurring. Borderline bilateral foraminal stenosis at C6-7 likewise due to disc bulge and mild uncinate spurring. Upper chest: Unremarkable Other: The scout image demonstrates deformity compatible with healed fracture in the right humeral diaphysis and left midclavicle. IMPRESSION: 1. No acute cervical spine findings. 2. Loss of the normal cervical lordosis, which can be associated with muscle spasm. 3. Cervical spondylosis and degenerative disc disease causing mild-to-moderate foraminal impingement at C5-6 and borderline foraminal stenosis at C6-7. 4. Mild bilateral common carotid atherosclerotic vascular calcification. Electronically Signed   By: Van Clines M.D.   On: 02/06/2023 18:13   CT Head Wo Contrast  Result Date: 02/06/2023 CLINICAL DATA:  Head trauma, fall, history of subdural hematoma. EXAM: CT HEAD WITHOUT CONTRAST TECHNIQUE: Contiguous axial images were obtained from the base of the skull through the vertex without intravenous contrast. RADIATION DOSE REDUCTION: This exam was  performed according to the departmental dose-optimization program which includes automated exposure control, adjustment of the mA and/or kV according to patient size and/or use of iterative reconstruction technique. COMPARISON:  11/30/2022 FINDINGS: Brain: Previous subdural hematoma along the right falx and tentorium has intervally cleared. The brainstem, cerebellum, cerebral peduncles, thalami, basal ganglia, basilar cisterns, and ventricular system appear within normal limits. No intracranial hemorrhage, mass lesion, or acute CVA. Vascular: Unremarkable Skull: Unremarkable Sinuses/Orbits: Old nasal bone and left zygomatic arch fractures. Chronic right maxillary sinusitis and mild chronic ethmoid sinusitis. Small mastoid effusions bilaterally. Other: Scalp hematomas along the bilateral parietal region, image 25 series 4. IMPRESSION: 1. No acute intracranial findings. Previous subdural hematoma along the right falx and tentorium has completely cleared. 2. Scalp hematomas along the bilateral parietal region. 3. 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 4. Chronic right maxillary sinusitis and mild chronic ethmoid sinusitis. Small mastoid effusions bilaterally. Electronically Signed   By: Van Clines M.D.   On: 02/06/2023 18:05   CT Angio Chest PE W and/or Wo Contrast  Result Date: 02/06/2023 CLINICAL DATA:  Fall, bruising on the right ribs. EXAM: CT ANGIOGRAPHY CHEST WITH CONTRAST TECHNIQUE: Multidetector CT imaging of the chest was performed using the standard protocol during bolus administration of intravenous contrast. Multiplanar CT image reconstructions and MIPs were obtained to evaluate the vascular anatomy. RADIATION DOSE REDUCTION: This exam was performed according to the departmental dose-optimization program which includes automated exposure control, adjustment of the mA and/or kV according to patient size and/or use of iterative reconstruction technique. CONTRAST:  42m OMNIPAQUE IOHEXOL  350 MG/ML SOLN COMPARISON:  Chest radiograph 02/06/2023 FINDINGS: Cardiovascular: No filling defect is identified in the pulmonary arterial tree to suggest pulmonary embolus. Coronary, aortic arch, and branch vessel atherosclerotic vascular disease. Mild cardiomegaly. Mediastinum/Nodes: No pathologic adenopathy. Circumferential wall thickening in the distal half of the thoracic esophagus extending to the GE junction. This may be a manifestation of esophagitis although is technically nonspecific. Distal paraesophageal lymph node 0.5 cm in short axis on image 94 series 5, possibly reactive. Lungs/Pleura: Scattered mild linear subsegmental atelectasis or  scarring. Airway thickening is present, suggesting bronchitis or reactive airways disease. There is frothy fluid in both the right and left mainstem bronchi. Mild dependent atelectasis in the left lower lobe. Nodularity associated with right upper lobe bandlike density on image 27 series 6, nodular component 9 by 7 by 9 mm (volume = 300 mm^3). Pleural-based nodular density in the left upper lobe just above the major fissure, 6 by 11 by 9 mm (volume = 300 mm^3) on image 40 of series 6. Upper Abdomen: Diffuse hepatic steatosis. Musculoskeletal: Mild midthoracic spondylosis. Review of the MIP images confirms the above findings. IMPRESSION: 1. No filling defect is identified in the pulmonary arterial tree to suggest pulmonary embolus. 2. Nodularity associated with right upper lobe bandlike density nodular component 9 by 7 by 9 mm (volume = 300 mm^3). Pleural-based nodular density in the left upper lobe just above the major fissure, 6 by 11 by 9 mm (volume = 300 mm^3). Per Fleischner Society Guidelines, consider a non-contrast Chest CT at 3 months, a PET/CT, or tissue sampling. These guidelines do not apply to immunocompromised patients and patients with cancer. Follow up in patients with significant comorbidities as clinically warranted. For lung cancer screening, adhere  to Lung-RADS guidelines. Reference: Radiology. 2017; 284(1):228-43. 3. Circumferential wall thickening in the distal half of the thoracic esophagus extending to the GE junction. This may be a manifestation of esophagitis although is technically nonspecific. 4. Airway thickening is present, suggesting bronchitis or reactive airways disease. Frothy fluid in both the right and left mainstem bronchi. 5. Coronary, aortic arch, and branch vessel atherosclerotic vascular disease. Mild cardiomegaly. 6. Diffuse hepatic steatosis. 7. Aortic and coronary atherosclerosis. Aortic Atherosclerosis (ICD10-I70.0). Electronically Signed   By: Van Clines M.D.   On: 02/06/2023 18:01   DG Chest Port 1 View  Result Date: 02/06/2023 CLINICAL DATA:  Shortness of breath. EXAM: PORTABLE CHEST 1 VIEW COMPARISON:  None Available. FINDINGS: Diffuse interstitial prominence, which may reflect mild interstitial pulmonary edema versus atypical or viral infection. Normal heart size and mediastinal contours. No pleural effusion or pneumothorax. Old fracture deformity of the left clavicle IMPRESSION: Diffuse interstitial prominence, which may reflect mild interstitial pulmonary edema versus atypical or viral infection. Electronically Signed   By: Emmit Alexanders M.D.   On: 02/06/2023 09:59   _______________________________________________________________________________________________________ Latest  Blood pressure 110/75, pulse (!) 108, temperature 98.9 F (37.2 C), resp. rate 18, SpO2 95 %.   Vitals  labs and radiology finding personally reviewed  Review of Systems:    Pertinent positives include: falls  Constitutional:  No weight loss, night sweats, Fevers, chills, fatigue, weight loss  HEENT:  No headaches, Difficulty swallowing,Tooth/dental problems,Sore throat,  No sneezing, itching, ear ache, nasal congestion, post nasal drip,  Cardio-vascular:  No chest pain, Orthopnea, PND, anasarca, dizziness, palpitations.no  Bilateral lower extremity swelling  GI:  No heartburn, indigestion, abdominal pain, nausea, vomiting, diarrhea, change in bowel habits, loss of appetite, melena, blood in stool, hematemesis Resp:  no shortness of breath at rest. No dyspnea on exertion, No excess mucus, no productive cough, No non-productive cough, No coughing up of blood.No change in color of mucus.No wheezing. Skin:  no rash or lesions. No jaundice GU:  no dysuria, change in color of urine, no urgency or frequency. No straining to urinate.  No flank pain.  Musculoskeletal:  No joint pain or no joint swelling. No decreased range of motion. No back pain.  Psych:  No change in mood or affect. No depression or anxiety. No  memory loss.  Neuro: no localizing neurological complaints, no tingling, no weakness, no double vision, no gait abnormality, no slurred speech, no confusion  All systems reviewed and apart from [place] all are negative _______________________________________________________________________________________________ Past Medical History:  Subdural hematoma, etoh abuse    Social History:  Ambulatory   independently   Endorses tobacco abuse and alcohol abuse but cannot provide details  Family History: Noncontributory ______________________________________________________________________________________________ Allergies: Not on File   Prior to Admission medications   Not on File    ___________________________________________________________________________________________________ Physical Exam:    02/06/2023    8:23 PM 02/06/2023    7:50 PM 02/06/2023    6:00 PM  Vitals with BMI  Systolic [id number]  Diastolic 75 75 65  Pulse [id number] 108 125     1. General:  in No  Acute distress   Chronically ill  -appearing 2. Psychological: Alert and   Oriented to self only  3. Head/ENT:    Dry Mucous Membranes                          Head Non traumatic, neck supple                        Poor  Dentition 4. SKIN:  decreased Skin turgor,  Skin clean Dry and intact no rash multiple abrasions ulcerations noted 5. Heart: Regular rate and rhythm no  Murmur, no Rub or gallop 6. Lungs:  , no wheezes or crackles   7. Abdomen: Soft,  non-tender, Non distended  bowel sounds present 8. Lower extremities: no clubbing, cyanosis, no  edema 9. Neurologically Grossly intact,  somewhat diminished lower extremities though 10. MSK: Normal range of motion    Chart has been reviewed  __________________   Assessment/Plan 62 y.o. male with medical history significant of  Alcohol abuse homelessness subdural hematoma in December 2023   Admitted for   Hypokalemia  Hypomagnesemia  Hypophosphatasia  Alcohol withdrawal syndrome with complication ([place])    Present on Admission:  Bilateral cellulitis of lower leg  Prolonged QT interval  Alcohol abuse  Hypokalemia  Hypomagnesemia  Hypophosphatemia  Protein calorie malnutrition ([facility])  Debility  Pancytopenia ([facility])  Wounds and injuries  Superficial bruising of abdominal wall     Prolonged QT interval - will monitor on tele avoid QT prolonging medications, rehydrate correct electrolytes   Bilateral cellulitis of lower leg -admit per  cellulitis protocol will     change to vancomycin/cefepime       plain films  ordered     Will obtain MRSA screening,    obtain blood cultures      further antibiotic adjustment pending above results   Alcohol abuse Order CIwa protocol  Hypokalemia Replace and recheck  Hypomagnesemia Replace and recheck  Hypophosphatemia Replace and recheck  Protein calorie malnutrition ([place]) Order nutritional consult IV thiamine Replace electrolytes check prealbumin  Debility  PT OT consult May benefit from placement  Pancytopenia ([place]) Possibly alcohol-related versus malnutrition.  Continue to follow May need hematology workup Obtain anemia panel  Homelessness Transitional care consult  Wounds and  injuries Multiple wounds of the lower extremities will obtain plain imaging wound care consult  Superficial bruising of abdominal wall Obtain CT abdomen to further evaluate noted thrombocytopenia which could be related to liver disease versus alcohol abuse  Fall at home, initial encounter Given persistent falls will need further evaluation Unclear if his neurological deficits MRI brain pending and  if abnormal will need neurology consult    Other plan as per orders.  DVT prophylaxis:  SCD      Code Status:    Code Status: Full Code FULL CODE   No ACP on file  Family Communication:   Family not at  Bedside    Disposition Plan:   likely will need placement for rehabilitation                            Following barriers for discharge:                            Electrolytes corrected                                              Would benefit from PT/OT eval prior to DC  Ordered                                      Transition of care consulted                   Nutrition    consulted                  Wound care  consulted                    Consults called:  none    Admission status:  ED Disposition     ED Disposition  [place]: [facility] [[id number]]  Level of Care: Progressive [102]  Admit to Progressive based on following criteria: MULTISYSTEM THREATS such as stable sepsis, metabolic/electrolyte imbalance with or without encephalopathy that is responding to early treatment.  May place patient in observation at [facility] or [place] if equivalent level of care is available:: No  Covid Evaluation: Asymptomatic - no recent exposure (last 10 days) testing not required  Diagnosis: Bilateral cellulitis of lower leg [id number]  Admitting Physician: Toy Baker [[id number]]  Attending Physician: Toy Baker [[id number]]           Obs    Level of care        progressive tele indefinitely please discontinue  once patient no longer qualifies COVID-19 Labs    Lab Results  Component Value Date   [place] 02/06/2023     Precautions: admitted as   Covid Negative  Whalen Trompeter 02/06/2023, 9:14 PM    [facility]     after 2 AM please page floor coverage PA If 7AM-7PM, please contact the day team taking care of the patient using [email or web address]   Patient was evaluated in the context of the global COVID-19 pandemic, which necessitated consideration that the patient might be at risk for infection with the SARS-CoV-2 virus that causes COVID-19. Institutional protocols and algorithms that pertain to the evaluation of patients at risk for COVID-19 are in a state of rapid change based on information released by regulatory bodies including the CDC and federal and state organizations. These policies and algorithms were followed during the patient's care.

## 2023-02-06 NOTE — TOC CAGE-AID Note (Deleted)
Transition of Care [facility]) - CAGE-AID Screening   Patient Details  Name: SHIV LAUER MRN: [id number] Date of Birth: 29-Jul-1961  Transition of Care [facility]) CM/SW Contact:    Kamy Poinsett C Tarpley-Carter, [place] Phone Number: 02/06/2023, 7:15 PM   Clinical Narrative: Pt could not participate in Cage-Aid.  Pt was very confused.  CSW provided pt with resources for possible future use.  Jceon Alverio Tarpley-Carter, MSW, LCSW-A Pronouns:  She/Her/Hers [facility] HealthTransitions of Care Clinical Social Worker Direct Number:  [phone] [email or web address]      CAGE-AID Screening: Substance Abuse Screening unable to be completed due to: : Patient unable to participate (Pt is confused.)

## 2023-02-06 NOTE — Assessment & Plan Note (Signed)
Order nutritional consult IV thiamine Replace electrolytes check prealbumin

## 2023-02-06 NOTE — Subjective & Objective (Signed)
Patient coming from public housing after multiple falls he lives currently in the [name] was found to be covered in feces and urine noted to be more short of breath on exertion With scabs on bilateral lower extremities rash in his groin area and a large bruise over his right flank Patient does not really cooperative answering question when asked what was the last time he drank he said at the bus stop but cannot really give me a date or time states he still smokes cigarettes but again not providing much more history beyond that

## 2023-02-06 NOTE — Assessment & Plan Note (Signed)
Transitional care consult

## 2023-02-06 NOTE — Assessment & Plan Note (Signed)
Multiple wounds of the lower extremities will obtain plain imaging wound care consult

## 2023-02-06 NOTE — Assessment & Plan Note (Signed)
Possibly alcohol-related versus malnutrition.  Continue to follow May need hematology workup Obtain anemia panel

## 2023-02-06 NOTE — ED Triage Notes (Signed)
Pt BIB EMS from public housing after a call that someone fell. Pt was found in [place] where he lives with diarrhea covering the seat. SOB with exertion, RLS Rhonchi bilaterally, No obvious injuries. Bilateral lower legs cellulitis PT is a poor historian and difficult to answer questions. A&Ox4 at this time. Uses walker to ambulate.   EMS V/S BG96 28-30 RR BP 96/40 1st  HR 150 then to 130 99.56F ET 23 RA 98%  18 L FA 500LR bolus

## 2023-02-06 NOTE — Assessment & Plan Note (Signed)
Obtain CT abdomen to further evaluate noted thrombocytopenia which could be related to liver disease versus alcohol abuse

## 2023-02-06 NOTE — Assessment & Plan Note (Signed)
PT OT consult May benefit from placement

## 2023-02-06 NOTE — ED Provider Notes (Signed)
Normal Provider Note   CSN: [id number] Arrival date & time: 02/06/23  [id number]     History  Chief Complaint  Patient presents with   Shortness of Breath   Diarrhea    Joseph Bell is a 62 y.o. male.  Additional history obtained per patient's other chart (2 MRN's).  62 year old male with a history of alcohol abuse who was brought in from public housing after being found on the ground.  Patient states that he had a fall several days ago but denies any falls since.  Says that he has had bilateral lower extremity pain and weakness of his right lower extremity greater than left lower extremity.  Also with numbness of his right lower extremity.  Both numbness and weakness started several days ago.  Says that he has a headache but denies any new neck or back pain.  Unsure of his last tetanus shot.  Says his last drink was several days ago.  History limited due to patient being a poor historian.  Per EMS, patient was found with diarrhea covering his seat.  He denies any nausea or vomiting to me at this time.  He was also stating that he had shortness of breath with exertion to EMS.  Last Tdap in 2023 per chart review.      Home Medications Prior to Admission medications   Not on File      Allergies    Patient has no allergy information on record.    Review of Systems   Review of Systems  Physical Exam Updated Vital Signs BP 109/69   Pulse (!) 107   Temp 98.9 F (37.2 C) (Oral)   Resp 20   SpO2 99%  Physical Exam Vitals and nursing note reviewed.  Constitutional:      General: He is not in acute distress.    Appearance: He is well-developed.  HENT:     Head: Normocephalic.     Comments: Dried blood to occiput    Right Ear: External ear normal.     Left Ear: External ear normal.     Nose: Nose normal.  Eyes:     Extraocular Movements: Extraocular movements intact.     Conjunctiva/sclera: Conjunctivae normal.     Pupils:  Pupils are equal, round, and reactive to light.  Neck:     Comments: No cervical midline tenderness to palpation Cardiovascular:     Rate and Rhythm: Regular rhythm. Tachycardia present.     Heart sounds: Normal heart sounds.  Pulmonary:     Effort: Pulmonary effort is normal. No respiratory distress.     Breath sounds: Normal breath sounds.  Abdominal:     General: There is no distension.     Palpations: Abdomen is soft. There is no mass.     Tenderness: There is no abdominal tenderness. There is no guarding.  Musculoskeletal:     Cervical back: Normal range of motion and neck supple.     Right lower leg: Edema (1+) present.     Left lower leg: Edema (1+) present.     Comments: No step-offs in cervical, thoracic, or lumbar spine.  Tenderness to palpation of bilateral knees.  Skin:    General: Skin is warm and dry.  Neurological:     Mental Status: He is alert.     Comments: Cranial nerves II through XII grossly intact.  Pupils 4 mm and reactive bilaterally.  Decreased sensation to light touch on right lower  extremity.  Globally weak but does appear to be 3 out of 5 in right lower extremity and 4 out of 5 elsewhere.  No tongue fasciculations or tremors noted.  Psychiatric:        Mood and Affect: Mood normal.        Behavior: Behavior normal.     ED Results / Procedures / Treatments   Labs (all labs ordered are listed, but only abnormal results are displayed) Labs Reviewed  COMPREHENSIVE METABOLIC PANEL - Abnormal; Notable for the following components:      Result Value   Sodium 131 (*)    Potassium 2.9 (*)    Chloride 95 (*)    CO2 21 (*)    BUN 7 (*)    Calcium 8.1 (*)    Total Protein 6.3 (*)    Albumin 3.0 (*)    AST 81 (*)    Alkaline Phosphatase 151 (*)    Total Bilirubin 1.6 (*)    All other components within normal limits  CBC - Abnormal; Notable for the following components:   WBC 3.8 (*)    RBC 3.52 (*)    Hemoglobin 12.6 (*)    HCT 35.3 (*)    MCV 100.3  (*)    MCH 35.8 (*)    Platelets 78 (*)    All other components within normal limits  MAGNESIUM - Abnormal; Notable for the following components:   Magnesium 1.3 (*)    All other components within normal limits  PHOSPHORUS - Abnormal; Notable for the following components:   Phosphorus 1.7 (*)    All other components within normal limits  BRAIN NATRIURETIC PEPTIDE - Abnormal; Notable for the following components:   B Natriuretic Peptide 139.0 (*)    All other components within normal limits  I-STAT CHEM 8, ED - Abnormal; Notable for the following components:   Sodium 132 (*)    Potassium 3.0 (*)    Chloride 95 (*)    BUN 7 (*)    Calcium, Ion 0.97 (*)    Hemoglobin 10.5 (*)    HCT 31.0 (*)    All other components within normal limits  RESP PANEL BY RT-PCR (RSV, FLU A&B, COVID)  [id number]  CK    EKG EKG Interpretation  Date/Time:  Monday February 06 2023 09:42:07 EST Ventricular Rate:  123 PR Interval:  117 QRS Duration: 80 QT Interval:  446 QTC Calculation: 639 R Axis:   73 Text Interpretation: Sinus tachycardia Borderline ST depression, diffuse leads Prolonged QT interval Confirmed by Margaretmary Eddy [phone]) on 02/06/2023 11:49:08 AM  Radiology DG Chest Port 1 View  Result Date: 02/06/2023 CLINICAL DATA:  Shortness of breath. EXAM: PORTABLE CHEST 1 VIEW COMPARISON:  None Available. FINDINGS: Diffuse interstitial prominence, which may reflect mild interstitial pulmonary edema versus atypical or viral infection. Normal heart size and mediastinal contours. No pleural effusion or pneumothorax. Old fracture deformity of the left clavicle IMPRESSION: Diffuse interstitial prominence, which may reflect mild interstitial pulmonary edema versus atypical or viral infection. Electronically Signed   By: Emmit Alexanders M.D.   On: 02/06/2023 09:59    Procedures Procedures   Medications Ordered in ED Medications  thiamine (VITAMIN B1) tablet 100 mg (100 mg Oral Given 02/06/23 1203)    Or   thiamine (VITAMIN B1) injection 100 mg ( Intravenous See Alternative [id number])  folic acid (FOLVITE) tablet 1 mg (1 mg Oral Given 02/06/23 1203)  multivitamin with minerals tablet 1 tablet (1 tablet Oral Given  02/06/23 1203)  LORazepam (ATIVAN) tablet 0-4 mg (1 mg Oral Given 02/06/23 1742)    Followed by  LORazepam (ATIVAN) tablet 0-4 mg (has no administration in time range)  cefTRIAXone (ROCEPHIN) 2 g in sodium chloride 0.9 % 100 mL IVPB (2 g Intravenous New Bag/Given 02/06/23 1744)  doxycycline (VIBRAMYCIN) 100 mg in sodium chloride 0.9 % 250 mL IVPB (100 mg Intravenous New Bag/Given 02/06/23 1747)  lactated ringers bolus 1,000 mL (has no administration in time range)  lactated ringers bolus 1,000 mL (0 mLs Intravenous Stopped 02/06/23 1105)  magnesium sulfate IVPB 2 g 50 mL (0 g Intravenous Stopped 02/06/23 1214)  potassium chloride (KLOR-CON) packet 80 mEq (80 mEq Oral Given 02/06/23 1111)  LORazepam (ATIVAN) injection 1 mg (1 mg Intravenous Given 02/06/23 1435)  ondansetron (ZOFRAN) injection 4 mg (4 mg Intravenous Given 02/06/23 1435)  iohexol (OMNIPAQUE) 350 MG/ML injection 75 mL (75 mLs Intravenous Contrast Given 02/06/23 1717)    ED Course/ Medical Decision Making/ A&P Clinical Course as of 02/06/23 1752  Mon Feb 06, 2023  1635 Signed out to Dr Nechama Guard [RP]  1636 37 M h/o Etoh abuse and withdrawal, homelessness presenting with alcohol withdrawal and recent falls and diarrhea. Mg, K and Phos low, Qtc 639, chronic malnourisment. CXR withatypical infection getting Rocephin and doxy. Follow up CT head, CT C spine, CTA PE. Bruising right ribs. MRI brain due to worsening b/l LE weakness and more on right side.  Keep on CIWA. Admit.  [VB]    Clinical Course User Index [RP] Fransico Meadow, MD [VB] Elgie Congo, MD                             Medical Decision Making Amount and/or Complexity of Data Reviewed Labs: ordered. Radiology: ordered.  Risk OTC drugs. Prescription drug  management.   Joseph Bell is a 62 y.o. male with comorbidities that complicate the patient evaluation including homelessness and alcohol abuse who presents to the emergency department with lower extremity weakness for several days as well as diarrhea and head trauma  Initial Ddx:  Alcohol withdrawal, gastroenteritis, intra-abdominal abscess, TBI, C-spine injury, pneumonia, PE, URI, rib fractures, rhabdomyolysis  MDM:  The patient may have alcohol withdrawal causing his tachycardia and other symptoms.  Will give fluids at this time.  May potentially have gastroenteritis as well with his diarrhea.  Does not have significant abdominal tenderness to palpation to suggest an intra-abdominal abscess that require abdominal CT.  With his alcohol use and apparent head injury will obtain CT of the head and C-spine.  Tdap appears to be up-to-date.  Does have right-sided rib bruising and will obtain a CT scan to assess for rib fractures as well as possible pneumonia.  Since he is tachycardic we will obtain it with arterial phase contrast in case he could have pulmonary embolism but feel this is less likely.  Plan:  Labs Electrolytes COVID/flu CK CT head, C-spine, CT chest IV fluids CIWA protocol  ED Summary/Re-evaluation:  Patient underwent the above workup and was found to have significant electrolyte abnormalities likely due to malnourishment.  These were repleted.  Chest x-ray showed possible atypical infection and COVID and flu were negative so he was started on antibiotics for CAP.  BNP was also ordered which was only marginally elevated at 139.  Patient did require several doses of Ativan for his withdrawal.  CT scan is pending but feel the patient will likely require  admission for his alcohol withdrawal as well as possible pneumonia treatment.  This patient presents to the ED for concern of complaints listed in HPI, this involves an extensive number of treatment options, and is a complaint  that carries with it a high risk of complications and morbidity. Disposition including potential need for admission considered.   Dispo: Pending remainder of workup  Additional history obtained from EMS Records reviewed Outpatient Clinic Notes and ED Visit Notes The following labs were independently interpreted: Chemistry and electrolytes  and show  malnutrition I independently reviewed the following imaging with scope of interpretation limited to determining acute life threatening conditions related to emergency care: Chest x-ray and agree with the radiologist interpretation with the following exceptions: None I personally reviewed and interpreted cardiac monitoring: sinus tachycardia I personally reviewed and interpreted the pt's EKG: see above for interpretation  I have reviewed the patients home medications and made adjustments as needed Social Determinants of health:  Homeless and alcohol abuse  Final Clinical Impression(s) / ED Diagnoses Final diagnoses:  Hypokalemia  Hypomagnesemia  Hypophosphatasia  Alcohol withdrawal syndrome with complication [facility])    Rx / DC Orders ED Discharge Orders     None         Fransico Meadow, MD 02/06/23 1753

## 2023-02-07 ENCOUNTER — Observation Stay (HOSPITAL_COMMUNITY): Payer: Self-pay

## 2023-02-07 DIAGNOSIS — L03116 Cellulitis of left lower limb: Secondary | ICD-10-CM

## 2023-02-07 DIAGNOSIS — R52 Pain, unspecified: Secondary | ICD-10-CM

## 2023-02-07 DIAGNOSIS — L0390 Cellulitis, unspecified: Secondary | ICD-10-CM

## 2023-02-07 DIAGNOSIS — F10939 Alcohol use, unspecified with withdrawal, unspecified: Secondary | ICD-10-CM

## 2023-02-07 DIAGNOSIS — E876 Hypokalemia: Secondary | ICD-10-CM

## 2023-02-07 DIAGNOSIS — L03115 Cellulitis of right lower limb: Secondary | ICD-10-CM

## 2023-02-07 LAB — CBC
HCT: 32.5 % — ABNORMAL LOW (ref 39.0–52.0)
Hemoglobin: 11.3 g/dL — ABNORMAL LOW (ref 13.0–17.0)
MCH: 35.6 pg — ABNORMAL HIGH (ref 26.0–34.0)
MCHC: 34.8 g/dL (ref 30.0–36.0)
MCV: 102.5 fL — ABNORMAL HIGH (ref 80.0–100.0)
Platelets: 82 10*3/uL — ABNORMAL LOW (ref 150–400)
RBC: 3.17 MIL/uL — ABNORMAL LOW (ref 4.22–5.81)
RDW: 13 % (ref 11.5–15.5)
WBC: 17.2 10*3/uL — ABNORMAL HIGH (ref 4.0–10.5)
nRBC: 0 % (ref 0.0–0.2)

## 2023-02-07 LAB — COMPREHENSIVE METABOLIC PANEL
ALT: 52 U/L — ABNORMAL HIGH (ref 0–44)
AST: 117 U/L — ABNORMAL HIGH (ref 15–41)
Albumin: 2.5 g/dL — ABNORMAL LOW (ref 3.5–5.0)
Alkaline Phosphatase: 54 U/L (ref 38–126)
Anion gap: 6 (ref 5–15)
BUN: 8 mg/dL (ref 8–23)
CO2: 27 mmol/L (ref 22–32)
Calcium: 7.6 mg/dL — ABNORMAL LOW (ref 8.9–10.3)
Chloride: 102 mmol/L (ref 98–111)
Creatinine, Ser: 0.82 mg/dL (ref 0.61–1.24)
GFR, Estimated: >60 mL/min (ref >60)
Glucose, Bld: 94 mg/dL (ref 70–99)
Potassium: 3.4 mmol/L — ABNORMAL LOW (ref 3.5–5.1)
Sodium: 135 mmol/L (ref 135–145)
Total Bilirubin: 0.8 mg/dL (ref 0.3–1.2)
Total Protein: 5.9 g/dL — ABNORMAL LOW (ref 6.5–8.1)

## 2023-02-07 LAB — HEPATITIS PANEL, ACUTE
HCV Ab: REACTIVE — AB
Hep A IgM: NONREACTIVE
Hep B C IgM: NONREACTIVE
Hepatitis B Surface Ag: NONREACTIVE

## 2023-02-07 LAB — MAGNESIUM: Magnesium: 2.2 mg/dL (ref 1.7–2.4)

## 2023-02-07 LAB — LACTIC ACID, PLASMA: Lactic Acid, Venous: 1.9 mmol/L (ref 0.5–1.9)

## 2023-02-07 LAB — PHOSPHORUS: Phosphorus: 3.7 mg/dL (ref 2.5–4.6)

## 2023-02-07 MED ORDER — OXYCODONE-ACETAMINOPHEN 5-325 MG PO TABS
1.0000 | ORAL_TABLET | ORAL | Status: DC | PRN
  Administered 2023-02-07 – 2023-02-09 (×3): 2 via ORAL
  Filled 2023-02-07 (×3): qty 2

## 2023-02-07 MED ORDER — SODIUM CHLORIDE 0.9 % IV SOLN: 2.0000 g | Freq: Once | INTRAVENOUS | Status: DC

## 2023-02-07 MED ORDER — POTASSIUM CHLORIDE CRYS ER 20 MEQ PO TBCR
40.0000 meq | EXTENDED_RELEASE_TABLET | Freq: Once | ORAL | Status: AC
  Administered 2023-02-07: 40 meq via ORAL
  Filled 2023-02-07: qty 2

## 2023-02-07 MED ORDER — ACETAMINOPHEN 325 MG PO TABS: 650.0000 mg | ORAL_TABLET | Freq: Four times a day (QID) | ORAL | Status: DC | PRN

## 2023-02-07 MED ORDER — SODIUM CHLORIDE 0.9 % IV SOLN: INTRAVENOUS | Status: AC

## 2023-02-07 MED ORDER — ACETAMINOPHEN 650 MG RE SUPP: 650.0000 mg | Freq: Four times a day (QID) | RECTAL | Status: DC | PRN

## 2023-02-07 MED ORDER — ONDANSETRON HCL 4 MG/2ML IJ SOLN: 4.0000 mg | Freq: Four times a day (QID) | INTRAMUSCULAR | Status: DC | PRN

## 2023-02-07 MED ORDER — NICOTINE 14 MG/24HR TD PT24
14.0000 mg | MEDICATED_PATCH | Freq: Every day | TRANSDERMAL | Status: DC
  Administered 2023-02-08 – 2023-02-16 (×9): 14 mg via TRANSDERMAL
  Filled 2023-02-07 (×9): qty 1

## 2023-02-07 MED ORDER — IPRATROPIUM-ALBUTEROL 0.5-2.5 (3) MG/3ML IN SOLN
3.0000 mL | Freq: Four times a day (QID) | RESPIRATORY_TRACT | Status: DC
  Administered 2023-02-07 (×2): 3 mL via RESPIRATORY_TRACT
  Filled 2023-02-07 (×3): qty 3

## 2023-02-07 MED ORDER — GUAIFENESIN ER 600 MG PO TB12
600.0000 mg | ORAL_TABLET | Freq: Two times a day (BID) | ORAL | Status: DC
  Administered 2023-02-08 – 2023-02-16 (×18): 600 mg via ORAL
  Filled 2023-02-07 (×18): qty 1

## 2023-02-07 MED ORDER — ALBUTEROL SULFATE (2.5 MG/3ML) 0.083% IN NEBU: 2.5000 mg | INHALATION_SOLUTION | RESPIRATORY_TRACT | Status: DC | PRN

## 2023-02-07 MED ORDER — IPRATROPIUM-ALBUTEROL 0.5-2.5 (3) MG/3ML IN SOLN
3.0000 mL | Freq: Two times a day (BID) | RESPIRATORY_TRACT | Status: DC
  Administered 2023-02-08 – 2023-02-09 (×4): 3 mL via RESPIRATORY_TRACT
  Filled 2023-02-07 (×4): qty 3

## 2023-02-07 MED ORDER — PANTOPRAZOLE SODIUM 40 MG PO TBEC
40.0000 mg | DELAYED_RELEASE_TABLET | Freq: Every day | ORAL | Status: DC
  Administered 2023-02-07 – 2023-02-10 (×4): 40 mg via ORAL
  Filled 2023-02-07 (×4): qty 1

## 2023-02-07 MED ORDER — VANCOMYCIN HCL IN DEXTROSE 1-5 GM/200ML-% IV SOLN: 1000.0000 mg | Freq: Once | INTRAVENOUS | Status: DC

## 2023-02-07 MED ORDER — RACEPINEPHRINE HCL 2.25 % IN NEBU
0.5000 mL | INHALATION_SOLUTION | RESPIRATORY_TRACT | Status: DC | PRN
  Administered 2023-02-07 (×2): 0.5 mL via RESPIRATORY_TRACT
  Filled 2023-02-07 (×3): qty 0.5

## 2023-02-07 MED ORDER — SODIUM CHLORIDE 0.9 % IV SOLN
2.0000 g | INTRAVENOUS | Status: DC
  Administered 2023-02-07 – 2023-02-09 (×3): 2 g via INTRAVENOUS
  Filled 2023-02-07 (×3): qty 20

## 2023-02-07 MED ORDER — MORPHINE SULFATE (PF) 2 MG/ML IV SOLN: 1.0000 mg | Freq: Once | INTRAVENOUS | Status: DC

## 2023-02-07 NOTE — Progress Notes (Addendum)
PROGRESS NOTE        PATIENT DETAILS Name: Joseph Bell Age: 62 y.o. Sex: male Date of Birth: 22-Feb-1961 Admit Date: 02/06/2023 Admitting Physician Toy Baker, MD QP:[id number], No Pcp Per  Brief Summary: Patient is a 62 y.o.  male with history of EtOH use, homelessness-presented to the ED with frequent falls, weakness-was found to have EtOH withdrawal, left> right leg cellulitis and numerous electrolyte abnormalities.  He was subsequently [facility].  Significant events: 3/4>> admit to [facility]  Significant studies: 3/4>> CT angio chest: No PE, circumferential wall thickening of distal half of thoracic esophagus. 3/4>> CT abdomen/pelvis: No acute pathology. 3/4>> CT C-spine: No acute findings. 3/4>> x-ray right knee: No fracture 3/4>> x-ray right fibula/tibia: No fracture 3/4>> x-ray left fibula/tibia: No fracture 3/5>> MRI brain: No acute intracranial process  Significant microbiology data: 3/4>> COVID/influenza/RSV PCR: Negative 3/4>> respiratory virus panel: Negative 3/4>> blood culture: Negative  Procedures: None  Consults: None  Subjective: Very poor historian when sleeping-hard to understand but follows commands-reluctantly acknowledges that he is still drinking beer.  Claims that he is living in his [name].  Claims that he has no family here in town-"all my family are dead"  Objective: Vitals: Blood pressure 95/68, pulse 88, temperature 98 F (36.7 C), temperature source Oral, resp. rate 20, height '5\' 4"'$  (1.626 m), weight 71.6 kg, SpO2 98 %.   Exam: Gen Exam:Alert awake-not in any distress.  Appears very disheveled/unkept. HEENT:atraumatic, normocephalic Chest: B/L clear to auscultation anteriorly CVS:S1S2 regular Abdomen:soft non tender, non distended Extremities: Leg erythema left> right.  No crepitus, no fluctuation. Neurology: Non focal Skin: no rash  Pertinent Labs/Radiology:    Latest Ref  Rng & Units 02/07/2023    6:42 AM 02/06/2023    9:42 PM 02/06/2023    9:19 PM  CBC  WBC 4.0 - 10.5 K/uL 17.2   19.5   Hemoglobin 13.0 - 17.0 g/dL 11.3  11.9  12.0   Hematocrit 39.0 - 52.0 % 32.5  35.0  35.2   Platelets 150 - 400 K/uL 82   83     Lab Results  Component Value Date   NA 135 02/07/2023   K 3.4 (L) 02/07/2023   CL 102 02/07/2023   CO2 27 02/07/2023      Assessment/Plan: Left> right leg cellulitis Does not appear to be purulent-appears to have improved compared to on admission. Continues to have significant leukocytosis Stop Vanco/cefepime Start Rocephin Follow cultures  Frequent falls Likely in the setting of EtOH/chronic debility Numerous imaging studies negative for fracture PT/OT  EtOH withdrawal Mildly tremulous Ativan per CIWA protocol Poor historian but reluctantly acknowledged that he still drinks beer-unclear when his last drink was. Swelling to discharge but do not think he has any intention of quitting at this point.  Hypokalemia/hypomagnesemia/hypophosphatemia In the setting of EtOH use Replete/recheck  Prolonged QTc Suspect due to electrolyte abnormalities repeat twelve-lead EKG tomorrow morning.  Thrombocytopenia In the setting of EtOH use Mild-stable for periodic follow-up  Circumferential wall thickening of the distal half of the thoracic esophagus Incidental finding on CTA chest Start PPI-although he is not overtly symptomatic. Will need outpatient follow-up with GI  Nodular opacity right upper lobe, pleural-based density left upper lobe Incidental finding on CT chest Radiology recommending repeat CT chest in 3 months  Bilateral pretibial lower extremity wounds Present  prior to admission Wound care team following Continue dressing per wound care team  Homelessness Lives in van-found covered with feces Per ED documentation Social worker eval  BMI: Estimated body mass index is 27.09 kg/m as calculated from the following:    Height as of this encounter: '5\' 4"'$  (1.626 m).   Weight as of this encounter: 71.6 kg.   Code status:   Code Status: Full Code   DVT Prophylaxis: SCDs given thrombocytopenia   Family Communication: None at bedside   Disposition Plan: Status is: Observation The patient will require care spanning > 2 midnights and should be moved to inpatient because: Severity of illness   Planned Discharge Destination:Home   Diet: Diet Order     None         Antimicrobial agents: Anti-infectives (From admission, onward)    Start     Dose/Rate Route Frequency Ordered Stop   02/07/23 1600  vancomycin (VANCOREADY) IVPB 750 mg/150 mL       See Hyperspace for full Linked Orders Report.   750 mg 150 mL/hr over 60 Minutes Intravenous Every 12 hours 02/06/23 2113     02/07/23 0600  ceFEPIme (MAXIPIME) 2 g in sodium chloride 0.9 % 100 mL IVPB        2 g 200 mL/hr over 30 Minutes Intravenous Every 8 hours 02/06/23 2113     02/06/23 2115  vancomycin (VANCOREADY) IVPB 1750 mg/350 mL       See Hyperspace for full Linked Orders Report.   1,750 mg 175 mL/hr over 120 Minutes Intravenous  Once 02/06/23 2113 02/07/23 0455   02/06/23 1615  cefTRIAXone (ROCEPHIN) 2 g in sodium chloride 0.9 % 100 mL IVPB        2 g 200 mL/hr over 30 Minutes Intravenous STAT 02/06/23 1611 02/06/23 1824   02/06/23 1615  doxycycline (VIBRAMYCIN) 100 mg in sodium chloride 0.9 % 250 mL IVPB        100 mg 125 mL/hr over 120 Minutes Intravenous  Once 02/06/23 1611 02/06/23 1947        MEDICATIONS: Scheduled Meds:  folic acid  1 mg Oral Daily   ipratropium-albuterol  3 mL Nebulization QID   LORazepam  0-4 mg Oral Q6H   Followed by   Derrill Memo ON 02/08/2023] LORazepam  0-4 mg Oral Q12H    morphine injection  1 mg Intravenous Once   multivitamin with minerals  1 tablet Oral Daily   nystatin   Topical TID   thiamine  100 mg Oral Daily   Or   thiamine  100 mg Intravenous Daily   Continuous Infusions:  ceFEPime (MAXIPIME)  IV 2 g (02/07/23 0911)   vancomycin     PRN Meds:.Racepinephrine HCl   I have personally reviewed following labs and imaging studies  LABORATORY DATA: CBC: Recent Labs  Lab 02/06/23 0930 02/06/23 1333 02/06/23 2119 02/06/23 2142 02/07/23 0642  WBC 3.8*  --  19.5*  --  17.2*  HGB 12.6* 10.5* 12.0* 11.9* 11.3*  HCT 35.3* 31.0* 35.2* 35.0* 32.5*  MCV 100.3*  --  104.5*  --  102.5*  PLT 78*  --  83*  --  82*    Basic Metabolic Panel: Recent Labs  Lab 02/06/23 0930 02/06/23 1333 02/06/23 2119 02/06/23 2142 02/07/23 0642  NA 131* 132* 133* 135 135  K 2.9* 3.0* 3.7 3.8 3.4*  CL 95* 95* 99  --  102  CO2 21*  --  22  --  27  GLUCOSE 79  86 103*  --  94  BUN 7* 7* 9  --  8  CREATININE 0.95 0.80 0.88  --  0.82  CALCIUM 8.1*  --  8.0*  --  7.6*  MG 1.3*  --  2.0  --  2.2  PHOS 1.7*  --   --   --  3.7    GFR: Estimated Creatinine Clearance: 84.8 mL/min (by C-G formula based on SCr of 0.82 mg/dL).  Liver Function Tests: Recent Labs  Lab 02/06/23 0930 02/06/23 2119 02/07/23 0642  AST 81* 131* 117*  ALT 29 45* 52*  ALKPHOS 151* 62 54  BILITOT 1.6* 0.7 0.8  PROT 6.3* 6.3* 5.9*  ALBUMIN 3.0* 2.8* 2.5*   Recent Labs  Lab 02/06/23 2119  LIPASE 29   Recent Labs  Lab 02/06/23 1952  AMMONIA 21    Coagulation Profile: Recent Labs  Lab 02/06/23 1952  INR 1.2    Cardiac Enzymes: Recent Labs  Lab 02/06/23 0930  CKTOTAL 216    BNP (last 3 results) No results for input(s): "PROBNP" in the last 8760 hours.  Lipid Profile: No results for input(s): "CHOL", "HDL", "LDLCALC", "TRIG", "CHOLHDL", "LDLDIRECT" in the last 72 hours.  Thyroid Function Tests: Recent Labs    02/06/23 2119  TSH 3.039    Anemia Panel: Recent Labs    02/06/23 2119  VITAMINB12 211  FOLATE 21.4  FERRITIN 581*  TIBC 231*  IRON 15*  RETICCTPCT 1.5    Urine analysis: No results found for: "COLORURINE", "APPEARANCEUR", "LABSPEC", "PHURINE", "GLUCOSEU", "HGBUR",  "BILIRUBINUR", "KETONESUR", "PROTEINUR", "UROBILINOGEN", "NITRITE", "LEUKOCYTESUR"  Sepsis Labs: Lactic Acid, Venous    Component Value Date/Time   LATICACIDVEN 1.9 02/06/2023 2352    MICROBIOLOGY: Recent Results (from the past 240 hour(s))  Resp panel by RT-PCR (RSV, Flu A&B, Covid) Anterior Nasal Swab     Status: None   Collection Time: 02/06/23  9:39 AM   Specimen: Anterior Nasal Swab  Result Value Ref Range Status   SARS Coronavirus 2 by RT PCR NEGATIVE NEGATIVE Final   Influenza A by PCR NEGATIVE NEGATIVE Final   Influenza B by PCR NEGATIVE NEGATIVE Final    Comment: (NOTE) The Xpert Xpress SARS-CoV-2/FLU/RSV plus assay is intended as an aid in the diagnosis of influenza from Nasopharyngeal swab specimens and should not be used as a sole basis for treatment. Nasal washings and aspirates are unacceptable for Xpert Xpress SARS-CoV-2/FLU/RSV testing.  Fact Sheet for Patients: EntrepreneurPulse.com.au  Fact Sheet for Healthcare Providers: IncredibleEmployment.be  This test is not yet approved or cleared by the [place] FDA and has been authorized for detection and/or diagnosis of SARS-CoV-2 by FDA under an Emergency Use Authorization (EUA). This EUA will remain in effect (meaning this test can be used) for the duration of the COVID-19 declaration under Section 564(b)(1) of the Act, 21 U.S.C. section 360bbb-3(b)(1), unless the authorization is terminated or revoked.     Resp Syncytial Virus by PCR NEGATIVE NEGATIVE Final    Comment: (NOTE) Fact Sheet for Patients: EntrepreneurPulse.com.au  Fact Sheet for Healthcare Providers: IncredibleEmployment.be  This test is not yet approved or cleared by the [place] FDA and has been authorized for detection and/or diagnosis of SARS-CoV-2 by FDA under an Emergency Use Authorization (EUA). This EUA will remain in effect (meaning this test can be  used) for the duration of the COVID-19 declaration under Section 564(b)(1) of the Act, 21 U.S.C. section 360bbb-3(b)(1), unless the authorization is terminated or revoked.  Performed at [facility]  Lab, [address] [place], [place] [address]   Culture, blood (x 2)     Status: None (Preliminary result)   Collection Time: 02/06/23  9:19 PM   Specimen: BLOOD  Result Value Ref Range Status   Specimen Description BLOOD RIGHT ANTECUBITAL  Final   Special Requests   Final    BOTTLES DRAWN AEROBIC AND ANAEROBIC Blood Culture adequate volume   Culture   Final    NO GROWTH < 12 HOURS Performed at [facility], [place] [address], [place], [place] [address]    Report Status PENDING  Incomplete  Culture, blood (x 2)     Status: None (Preliminary result)   Collection Time: 02/06/23  9:37 PM   Specimen: BLOOD  Result Value Ref Range Status   Specimen Description BLOOD SITE NOT SPECIFIED  Final   Special Requests   Final    BOTTLES DRAWN AEROBIC AND ANAEROBIC Blood Culture adequate volume   Culture   Final    NO GROWTH < 12 HOURS Performed at [facility], [place] [address], [place], [place] [address]    Report Status PENDING  Incomplete  Respiratory (~20 pathogens) panel by PCR     Status: None   Collection Time: 02/06/23  9:41 PM   Specimen: Nasopharyngeal Swab; Respiratory  Result Value Ref Range Status   Adenovirus NOT DETECTED NOT DETECTED Final   Coronavirus 229E NOT DETECTED NOT DETECTED Final    Comment: (NOTE) The Coronavirus on the Respiratory Panel, DOES NOT test for the novel  Coronavirus (2019 nCoV)    Coronavirus HKU1 NOT DETECTED NOT DETECTED Final   Coronavirus NL63 NOT DETECTED NOT DETECTED Final   Coronavirus OC43 NOT DETECTED NOT DETECTED Final   Metapneumovirus NOT DETECTED NOT DETECTED Final   Rhinovirus / Enterovirus NOT DETECTED NOT DETECTED Final   Influenza A NOT DETECTED NOT DETECTED Final   Influenza B NOT DETECTED NOT DETECTED Final    Parainfluenza Virus 1 NOT DETECTED NOT DETECTED Final   Parainfluenza Virus 2 NOT DETECTED NOT DETECTED Final   Parainfluenza Virus 3 NOT DETECTED NOT DETECTED Final   Parainfluenza Virus 4 NOT DETECTED NOT DETECTED Final   Respiratory Syncytial Virus NOT DETECTED NOT DETECTED Final   Bordetella pertussis NOT DETECTED NOT DETECTED Final   Bordetella Parapertussis NOT DETECTED NOT DETECTED Final   Chlamydophila pneumoniae NOT DETECTED NOT DETECTED Final   Mycoplasma pneumoniae NOT DETECTED NOT DETECTED Final    Comment: Performed at [facility], [place]. [address], [place], [place] [address]    RADIOLOGY STUDIES/RESULTS: MR BRAIN WO CONTRAST  Result Date: 02/07/2023 CLINICAL DATA:  Right lower extremity weakness and numbness for several days EXAM: MRI HEAD WITHOUT CONTRAST TECHNIQUE: Multiplanar, multiecho pulse sequences of the brain and surrounding structures were obtained without intravenous contrast. COMPARISON:  No prior MRI brain available, correlation is made with CT head 02/06/2023 FINDINGS: Brain: No restricted diffusion to suggest acute or subacute infarct. No acute hemorrhage, mass, mass effect, or midline shift. No hydrocephalus or extra-axial collection. Normal pituitary and craniocervical junction. No hemosiderin deposition to suggest remote hemorrhage. Advanced cerebral atrophy for age. Vascular: Normal arterial flow voids. Skull and upper cervical spine: Normal marrow signal. Sinuses/Orbits: Mucosal thickening in the right maxillary sinus and anterior ethmoid air cells. No acute finding in the orbits. Other: Fluid throughout bilateral mastoid air cells. IMPRESSION: 1. No acute intracranial process. 2. Advanced cerebral atrophy for age. Electronically Signed   By: Francetta Found.D.  On: 02/07/2023 02:25   CT ABDOMEN PELVIS W CONTRAST  Result Date: 02/06/2023 CLINICAL DATA:  Blunt abdominal trauma. EXAM: CT ABDOMEN AND PELVIS WITH CONTRAST TECHNIQUE: Multidetector CT imaging of the  abdomen and pelvis was performed using the standard protocol following bolus administration of intravenous contrast. RADIATION DOSE REDUCTION: This exam was performed according to the departmental dose-optimization program which includes automated exposure control, adjustment of the mA and/or kV according to patient size and/or use of iterative reconstruction technique. CONTRAST:  42m OMNIPAQUE IOHEXOL 350 MG/ML SOLN COMPARISON:  None Available. FINDINGS: Evaluation is limited due to streak artifact caused by patient's arms. Lower chest: Partially visualized trace left pleural effusion versus pleural thickening. The visualized lung bases are otherwise clear. No intra-abdominal free air or free fluid. Hepatobiliary: Fatty liver. No biliary dilatation. The gallbladder is unremarkable. Pancreas: Unremarkable. No pancreatic ductal dilatation or surrounding inflammatory changes. Spleen: Normal in size without focal abnormality. Adrenals/Urinary Tract: The adrenal glands are unremarkable. The kidneys, visualized ureters appear unremarkable. There is diffuse thickened appearance of the bladder wall which may be partly related to underdistention. Correlation with urinalysis recommended to evaluate for cystitis. Stomach/Bowel: There are small scattered colonic diverticula without active inflammatory changes. There is no bowel obstruction or active inflammation. The appendix is normal. Vascular/Lymphatic: Mild aortoiliac atherosclerotic disease. The IVC is unremarkable. Six no portal venous gas. There is no adenopathy. Reproductive: Prostate and seminal vesicles are grossly unremarkable. No pelvic mass. Other: None Musculoskeletal: Degenerative changes of the spine. No acute osseous pathology. IMPRESSION: 1. No acute intra-abdominal or pelvic pathology. 2. Fatty liver. 3. Small scattered colonic diverticula. No bowel obstruction. Normal appendix. 4. Partially visualized trace left pleural effusion versus pleural thickening.  5.  Aortic Atherosclerosis (ICD10-I70.0). Electronically Signed   By: AAnner CreteM.D.   On: 02/06/2023 22:42   DG Tibia/Fibula Right Port  Result Date: 02/06/2023 CLINICAL DATA:  Knee injury EXAM: PORTABLE RIGHT TIBIA AND FIBULA - 2 VIEW COMPARISON:  None Available. FINDINGS: There is no evidence of fracture or other focal bone lesions. Soft tissues are unremarkable. IMPRESSION: Negative. Electronically Signed   By: KRolm BaptiseM.D.   On: 02/06/2023 21:57   DG Tibia/Fibula Left Port  Result Date: 02/06/2023 CLINICAL DATA:  Ulcers both lower extremities. EXAM: PORTABLE LEFT TIBIA AND FIBULA - 2 VIEW COMPARISON:  None Available. FINDINGS: There is no evidence of fracture or other focal bone lesions. Soft tissues are unremarkable. No soft tissue gas or radiopaque foreign body. IMPRESSION: Negative. Electronically Signed   By: KRolm BaptiseM.D.   On: 02/06/2023 21:57   DG Knee 1-2 Views Right  Result Date: 02/06/2023 CLINICAL DATA:  Knee injury EXAM: RIGHT KNEE - 1-2 VIEW COMPARISON:  08/31/2022 FINDINGS: No evidence of fracture, dislocation, or joint effusion. No evidence of arthropathy or other focal bone abnormality. Soft tissues are unremarkable. IMPRESSION: Negative. Electronically Signed   By: KRolm BaptiseM.D.   On: 02/06/2023 21:56   CT Cervical Spine Wo Contrast  Result Date: 02/06/2023 CLINICAL DATA:  Fall, scalp contusions. EXAM: CT CERVICAL SPINE WITHOUT CONTRAST TECHNIQUE: Multidetector CT imaging of the cervical spine was performed without intravenous contrast. Multiplanar CT image reconstructions were also generated. RADIATION DOSE REDUCTION: This exam was performed according to the departmental dose-optimization program which includes automated exposure control, adjustment of the mA and/or kV according to patient size and/or use of iterative reconstruction technique. COMPARISON:  01/31/2022 FINDINGS: Despite efforts by the technologist and patient, motion artifact is present on today's  exam  and could not be eliminated. This reduces exam sensitivity and specificity. Alignment: Loss of the normal cervical lordosis, which can be associated with muscle spasm. Skull base and vertebrae: No acute fracture or acute bony findings. Soft tissues and spinal canal: Mild bilateral common carotid atherosclerotic vascular calcification. Disc levels: Moderate right and mild left foraminal stenosis at C5-6 due to disc bulge and uncinate spurring. Borderline bilateral foraminal stenosis at C6-7 likewise due to disc bulge and mild uncinate spurring. Upper chest: Unremarkable Other: The scout image demonstrates deformity compatible with healed fracture in the right humeral diaphysis and left midclavicle. IMPRESSION: 1. No acute cervical spine findings. 2. Loss of the normal cervical lordosis, which can be associated with muscle spasm. 3. Cervical spondylosis and degenerative disc disease causing mild-to-moderate foraminal impingement at C5-6 and borderline foraminal stenosis at C6-7. 4. Mild bilateral common carotid atherosclerotic vascular calcification. Electronically Signed   By: Van Clines M.D.   On: 02/06/2023 18:13   CT Head Wo Contrast  Result Date: 02/06/2023 CLINICAL DATA:  Head trauma, fall, history of subdural hematoma. EXAM: CT HEAD WITHOUT CONTRAST TECHNIQUE: Contiguous axial images were obtained from the base of the skull through the vertex without intravenous contrast. RADIATION DOSE REDUCTION: This exam was performed according to the departmental dose-optimization program which includes automated exposure control, adjustment of the mA and/or kV according to patient size and/or use of iterative reconstruction technique. COMPARISON:  11/30/2022 FINDINGS: Brain: Previous subdural hematoma along the right falx and tentorium has intervally cleared. The brainstem, cerebellum, cerebral peduncles, thalami, basal ganglia, basilar cisterns, and ventricular system appear within normal limits. No  intracranial hemorrhage, mass lesion, or acute CVA. Vascular: Unremarkable Skull: Unremarkable Sinuses/Orbits: Old nasal bone and left zygomatic arch fractures. Chronic right maxillary sinusitis and mild chronic ethmoid sinusitis. Small mastoid effusions bilaterally. Other: Scalp hematomas along the bilateral parietal region, image 25 series 4. IMPRESSION: 1. No acute intracranial findings. Previous subdural hematoma along the right falx and tentorium has completely cleared. 2. Scalp hematomas along the bilateral parietal region. 3. 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 4. Chronic right maxillary sinusitis and mild chronic ethmoid sinusitis. Small mastoid effusions bilaterally. Electronically Signed   By: Van Clines M.D.   On: 02/06/2023 18:05   CT Angio Chest PE W and/or Wo Contrast  Result Date: 02/06/2023 CLINICAL DATA:  Fall, bruising on the right ribs. EXAM: CT ANGIOGRAPHY CHEST WITH CONTRAST TECHNIQUE: Multidetector CT imaging of the chest was performed using the standard protocol during bolus administration of intravenous contrast. Multiplanar CT image reconstructions and MIPs were obtained to evaluate the vascular anatomy. RADIATION DOSE REDUCTION: This exam was performed according to the departmental dose-optimization program which includes automated exposure control, adjustment of the mA and/or kV according to patient size and/or use of iterative reconstruction technique. CONTRAST:  86m OMNIPAQUE IOHEXOL 350 MG/ML SOLN COMPARISON:  Chest radiograph 02/06/2023 FINDINGS: Cardiovascular: No filling defect is identified in the pulmonary arterial tree to suggest pulmonary embolus. Coronary, aortic arch, and branch vessel atherosclerotic vascular disease. Mild cardiomegaly. Mediastinum/Nodes: No pathologic adenopathy. Circumferential wall thickening in the distal half of the thoracic esophagus extending to the GE junction. This may be a manifestation of esophagitis although is technically  nonspecific. Distal paraesophageal lymph node 0.5 cm in short axis on image 94 series 5, possibly reactive. Lungs/Pleura: Scattered mild linear subsegmental atelectasis or scarring. Airway thickening is present, suggesting bronchitis or reactive airways disease. There is frothy fluid in both the right and left mainstem bronchi. Mild dependent  atelectasis in the left lower lobe. Nodularity associated with right upper lobe bandlike density on image 27 series 6, nodular component 9 by 7 by 9 mm (volume = 300 mm^3). Pleural-based nodular density in the left upper lobe just above the major fissure, 6 by 11 by 9 mm (volume = 300 mm^3) on image 40 of series 6. Upper Abdomen: Diffuse hepatic steatosis. Musculoskeletal: Mild midthoracic spondylosis. Review of the MIP images confirms the above findings. IMPRESSION: 1. No filling defect is identified in the pulmonary arterial tree to suggest pulmonary embolus. 2. Nodularity associated with right upper lobe bandlike density nodular component 9 by 7 by 9 mm (volume = 300 mm^3). Pleural-based nodular density in the left upper lobe just above the major fissure, 6 by 11 by 9 mm (volume = 300 mm^3). Per Fleischner Society Guidelines, consider a non-contrast Chest CT at 3 months, a PET/CT, or tissue sampling. These guidelines do not apply to immunocompromised patients and patients with cancer. Follow up in patients with significant comorbidities as clinically warranted. For lung cancer screening, adhere to Lung-RADS guidelines. Reference: Radiology. 2017; 284(1):228-43. 3. Circumferential wall thickening in the distal half of the thoracic esophagus extending to the GE junction. This may be a manifestation of esophagitis although is technically nonspecific. 4. Airway thickening is present, suggesting bronchitis or reactive airways disease. Frothy fluid in both the right and left mainstem bronchi. 5. Coronary, aortic arch, and branch vessel atherosclerotic vascular disease. Mild  cardiomegaly. 6. Diffuse hepatic steatosis. 7. Aortic and coronary atherosclerosis. Aortic Atherosclerosis (ICD10-I70.0). Electronically Signed   By: Van Clines M.D.   On: 02/06/2023 18:01   DG Chest Port 1 View  Result Date: 02/06/2023 CLINICAL DATA:  Shortness of breath. EXAM: PORTABLE CHEST 1 VIEW COMPARISON:  None Available. FINDINGS: Diffuse interstitial prominence, which may reflect mild interstitial pulmonary edema versus atypical or viral infection. Normal heart size and mediastinal contours. No pleural effusion or pneumothorax. Old fracture deformity of the left clavicle IMPRESSION: Diffuse interstitial prominence, which may reflect mild interstitial pulmonary edema versus atypical or viral infection. Electronically Signed   By: Emmit Alexanders M.D.   On: 02/06/2023 09:59     LOS: 0 days   Oren Binet, MD  [facility]    To contact the attending provider between 7A-7P or the covering provider during after hours 7P-7A, please log into the web site [email or web address] and access using universal [place] password for that web site. If you do not have the password, please call the hospital operator.  02/07/2023, 9:44 AM

## 2023-02-07 NOTE — Evaluation (Signed)
Occupational Therapy Evaluation Patient Details Name: Joseph Bell MRN: [id number] DOB: Aug 25, 1961 Today's Date: 02/07/2023   History of Present Illness Pt is a 62 y.o. male arriving via EMS after being found down and covered with stool in his [name]. Found to have SOB on exertion, RLS rhonchi bilaterally, and BLE cellulitis. CT head with no acute findings; CXR with diffuse interstitial prominence (pulmonary edema vs viral infection). PMH significant for alcohol abuse, homelessness, and subdural hematoma in Dec 2023.   Clinical Impression   PTA, pt reports he was homeless, and was modified independent with use of RW until recently in which he has began to experience multiple falls. Upon eval, pt requires up to mod A for LB ADL and min guard A for seated UB ADL. Pt performing basic transfers with min A +2 . Pt presenting with decreased cognition, orientation, safety, awareness, attention, balance, strength, and activity tolerance. Recommending further OT services at SNF upon discharge and will continue to follow acutely.      Recommendations for follow up therapy are one component of a multi-disciplinary discharge planning process, led by the attending physician.  Recommendations may be updated based on patient status, additional functional criteria and insurance authorization.   Follow Up Recommendations  Skilled nursing-short term rehab (<3 hours/day)     Assistance Recommended at Discharge Frequent or constant Supervision/Assistance  Patient can return home with the following A lot of help with walking and/or transfers;A lot of help with bathing/dressing/bathroom;Help with stairs or ramp for entrance;Assist for transportation;Direct supervision/assist for financial management;Direct supervision/assist for medications management;Assistance with cooking/housework    Functional Status Assessment  Patient has had a recent decline in their functional status and demonstrates the ability to make  significant improvements in function in a reasonable and predictable amount of time.  Equipment Recommendations  Other (comment) (defer)    Recommendations for Other Services       Precautions / Restrictions Precautions Precautions: Fall;Other (comment) Precaution Comments: rectal pouch/tube, BLE wounds, condom cath Restrictions Weight Bearing Restrictions: No      Mobility Bed Mobility Overal bed mobility: Needs Assistance Bed Mobility: Supine to Sit, Sit to Supine     Supine to sit: Min assist, +2 for physical assistance Sit to supine: Min guard   General bed mobility comments: Min A +2 to elevate trunk. Min guard -supervision EOB    Transfers Overall transfer level: Needs assistance Equipment used: 2 person hand held assist Transfers: Sit to/from Stand Sit to Stand: Min assist, +2 physical assistance, +2 safety/equipment           General transfer comment: Min A for power up and stedy.      Balance Overall balance assessment: Needs assistance Sitting-balance support: Single extremity supported, Bilateral upper extremity supported, No upper extremity supported Sitting balance-Leahy Scale: Poor Sitting balance - Comments: Reliant on at least one UE during dyamic sitting (doning/doffing socks)   Standing balance support: Bilateral upper extremity supported, During functional activity Standing balance-Leahy Scale: Poor Standing balance comment: reliant on external support                           ADL either performed or assessed with clinical judgement   ADL Overall ADL's : Needs assistance/impaired Eating/Feeding: Supervision/ safety;Sitting   Grooming: Wash/dry face;Supervision/safety;Sitting Grooming Details (indicate cue type and reason): at EOB Upper Body Bathing: Sitting;Min guard   Lower Body Bathing: Sit to/from stand;Minimal assistance   Upper Body Dressing : Min guard;Sitting  Lower Body Dressing: Moderate assistance;Min  guard;Sitting/lateral leans Lower Body Dressing Details (indicate cue type and reason): mod A to don socks; min guard A to doff during session. Decr sitting balance. Toilet Transfer: Minimal assistance;+2 for physical assistance;Stand-pivot Toilet Transfer Details (indicate cue type and reason): side steps toward HOB. Min A to rise and stedy. Pt not wanting to use RW, so providing two person HHA Toileting- Water quality scientist and Hygiene: Total assistance Toileting - Clothing Manipulation Details (indicate cue type and reason): rectal tube and condom catheter     Functional mobility during ADLs: Minimal assistance;+2 for physical assistance;+2 for safety/equipment General ADL Comments: decr activity tolerance, strength, and balance     Vision Baseline Vision/History: 0 No visual deficits (has reading glasses) Ability to See in Adequate Light: 0 Adequate Patient Visual Report: No change from baseline Vision Assessment?: No apparent visual deficits Additional Comments: continue to assess. WFL for tasks assessed. Receiving items from therapist without undershooting or overshooting and "normal" per pt report     Perception     Praxis      Pertinent Vitals/Pain Pain Assessment Pain Assessment: Faces Faces Pain Scale: Hurts even more Pain Location: BLE Pain Descriptors / Indicators: Aching, Tender, Sore, Grimacing, Guarding Pain Intervention(s): Limited activity within patient's tolerance, Monitored during session, Repositioned     Hand Dominance     Extremity/Trunk Assessment Upper Extremity Assessment Upper Extremity Assessment: Generalized weakness (4-/5 and decr endurance to sustain effort)   Lower Extremity Assessment Lower Extremity Assessment: Defer to PT evaluation       Communication Communication Communication: No difficulties (Occasional increased time)   Cognition Arousal/Alertness: Awake/alert Behavior During Therapy: WFL for tasks assessed/performed, Flat  affect Overall Cognitive Status: No family/caregiver present to determine baseline cognitive functioning Area of Impairment: Orientation, Following commands, Awareness, Safety/judgement, Problem solving, Attention, Memory                 Orientation Level: Disoriented to, Time (reporting it is 2021) Current Attention Level: Sustained, Focused (Greater success with sustained attention with limiting external distractions) Memory: Decreased short-term memory Following Commands: Follows one step commands consistently, Follows one step commands with increased time Safety/Judgement: Decreased awareness of safety, Decreased awareness of deficits Awareness: Emergent (Mostly aware of current physical limitations (decr balance and strength)) Problem Solving: Slow processing, Difficulty sequencing, Requires verbal cues, Decreased initiation General Comments: Pt max externally distratced in a minimally distracting environment. Unable to demonstrate selective attention with TV on without volume. Once turned off, better attention to therapists, btu remained internally distracted requiring repeated cues to follow commands. Mildly anxious with attempts to move due to recent falls. Poor health literacy and understanding of rectal tube placement, reporting multiple times he needed to go to restroom after education that he could go where he was due to rectal pouch. Pt aware of sitter in room and what she has done for him this morning (got him cleaned up) as well as a general understanding of why he is in the hospital.     General Comments  VSS on 2L    Exercises     Shoulder Instructions      Home Living Family/patient expects to be discharged to:: Shelter/Homeless                                 Additional Comments: Pt reports he usually stays in friend's truck      Prior Functioning/Environment Prior Level of Function :  Independent/Modified Independent;History of Falls (last six  months)             Mobility Comments: Pt reports use of rollator walker. Multiple recent falls. ADLs Comments: Pt reports he was independent until recently when he began to have multiple falls        OT Problem List: Decreased strength;Decreased activity tolerance;Impaired balance (sitting and/or standing);Decreased cognition;Decreased safety awareness;Decreased knowledge of use of DME or AE;Pain      OT Treatment/Interventions: Self-care/ADL training;Therapeutic exercise;DME and/or AE instruction;Patient/family education;Balance training;Therapeutic activities    OT Goals(Current goals can be found in the care plan section) Acute Rehab OT Goals Patient Stated Goal: get better OT Goal Formulation: With patient Time For Goal Achievement: 02/21/23 Potential to Achieve Goals: Fair  OT Frequency: Min 2X/week    Co-evaluation              AM-PAC OT "6 Clicks" Daily Activity     Outcome Measure Help from another person eating meals?: None Help from another person taking care of personal grooming?: A Little Help from another person toileting, which includes using toliet, bedpan, or urinal?: Total Help from another person bathing (including washing, rinsing, drying)?: A Lot Help from another person to put on and taking off regular upper body clothing?: A Little Help from another person to put on and taking off regular lower body clothing?: A Lot 6 Click Score: 15   End of Session Equipment Utilized During Treatment: Gait belt Nurse Communication: Mobility status  Activity Tolerance: Patient tolerated treatment well Patient left: in bed;with call bell/phone within reach;with bed alarm set;with nursing/sitter in room  OT Visit Diagnosis: Unsteadiness on feet (R26.81);Muscle weakness (generalized) (M62.81);Repeated falls (R29.6);History of falling (Z91.81);Other symptoms and signs involving cognitive function                Time: [id number] OT Time Calculation (min): 21  min Charges:  OT General Charges $OT Visit: 1 Visit OT Evaluation $OT Eval Moderate Complexity: 1 Mod  Elder Bell, OTR/L [facility] Acute Rehabilitation Office: [phone]  Magnus Ivan 02/07/2023, 11:23 AM

## 2023-02-07 NOTE — Consult Note (Signed)
WOC Nurse Consult Note: Reason for Consult:bilateral LE wounds at pretibial areas. Right knee skin tear (partial thickness). Consult performed remotely following review of medical record including photodocumentation of wounds. Irritant contact dermatitis due to fecal incontinence.  ICD-10 CM Codes for Irritant Dermatitis L24A2 - Due to fecal, urinary or dual incontinence L24A9 - Due to friction or contact with other specified body fluids  Wound type: trauma, infection Pressure Injury POA: N/A Measurement: Right knee skin tear 3cm x 1.5cm x 0.1cm Scattered lesions on bilateral pretibial region Wound [id number] tear: pink, moist.  Pretibial lesions are dry, scabbed Drainage (amount, consistency, odor) scant serous Periwound:erythema Dressing procedure/placement/frequency:I have provided conservative care guidance for these lesions using a cleanse followed by covering with folded layers of xeroform gauze (antimicrobial, nonadherent). These will be covered and secured on the LEs with an ABD pad and Kerlix roll gauze and on the right knee skin tear with a silicone foam.  Prevalon boots are provided. House skin care products are to be used for fecal incontinence and resultant ICD. Bruise over right flank is secondary to trauma, fall.  [place] nursing team will not follow, but will remain available to this patient, the nursing and medical teams.  Please re-consult if needed.  Thank you for inviting [place] to participate in this patient's Plan of Care.  Maudie Flakes, MSN, RN, CNS, [place], Serita Grammes, [employer], [employer] phone:  [phone]

## 2023-02-07 NOTE — Evaluation (Signed)
Physical Therapy Evaluation Patient Details Name: Joseph Bell MRN: [id number] DOB: June 22, 1961 Today's Date: 02/07/2023  History of Present Illness  Pt is a 62 y.o. male arriving via EMS after being found down and covered with stool in his [name]. Found to have SOB on exertion, RLS rhonchi bilaterally, and BLE cellulitis. CT head with no acute findings; CXR with diffuse interstitial prominence (pulmonary edema vs viral infection). PMH significant for alcohol abuse, homelessness, and subdural hematoma in Dec 2023.  Clinical Impression  Pt presents today functioning below his mobility baseline, with deficits in strength, balance, pain, and endurance. Pt reports at baseline he is modified independent with mobility with use of rollator, pt currently requiring minAx2 for bed mobility and sit<>stand transfers on 2L O2 [place]. Pt able to take ~2 side steps before fatiguing and needing a rest break due to pain, wide BOS and hip strategy utilized for balance, declining any attempts at further ambulation at this time. Pt is motivated to progress his mobility but required min encouragement to participate. Pt will continue to benefit from skilled acute PT at this time to progress mobility and address deficits, recommend SNF at discharge. Acute PT will follow as appropriate.      Recommendations for follow up therapy are one component of a multi-disciplinary discharge planning process, led by the attending physician.  Recommendations may be updated based on patient status, additional functional criteria and insurance authorization.  Follow Up Recommendations Skilled nursing-short term rehab (<3 hours/day) Can patient physically be transported by private vehicle: No    Assistance Recommended at Discharge Frequent or constant Supervision/Assistance  Patient can return home with the following  A lot of help with walking and/or transfers;Assist for transportation;Help with stairs or ramp for entrance    Equipment  Recommendations None recommended by PT  Recommendations for Other Services       Functional Status Assessment Patient has had a recent decline in their functional status and demonstrates the ability to make significant improvements in function in a reasonable and predictable amount of time.     Precautions / Restrictions Precautions Precautions: Fall;Other (comment) Precaution Comments: rectal pouch, BLE wounds Restrictions Weight Bearing Restrictions: No      Mobility  Bed Mobility Overal bed mobility: Needs Assistance Bed Mobility: Supine to Sit, Sit to Supine     Supine to sit: Min assist, +2 for physical assistance, HOB elevated Sit to supine: Min guard, HOB elevated   General bed mobility comments: use of side rails, increased time for supine>sit with assist required for trunk support, minG for return to supine for line management    Transfers Overall transfer level: Needs assistance Equipment used: 2 person hand held assist Transfers: Sit to/from Stand Sit to Stand: Min assist, +2 physical assistance           General transfer comment: minA for power up and steadying balance    Ambulation/Gait             Pre-gait activities: took ~2 side steps towards [facility] with minAx2 for balance, wide BOS and limited by pain General Gait Details: pt declining attempt, limited by pain in BLE  Stairs            Wheelchair Mobility    Modified Rankin (Stroke Patients Only)       Balance Overall balance assessment: Needs assistance Sitting-balance support: Single extremity supported, Feet supported Sitting balance-Leahy Scale: Poor Sitting balance - Comments: stable static sitting but requiring UE support with dynamic sitting  Standing balance support: Bilateral upper extremity supported, During functional activity Standing balance-Leahy Scale: Poor Standing balance comment: reliant on external support with standing and side stepping                              Pertinent Vitals/Pain Pain Assessment Pain Assessment: Faces Faces Pain Scale: Hurts even more Pain Location: BLE Pain Descriptors / Indicators: Tender, Aching, Guarding Pain Intervention(s): Limited activity within patient's tolerance, Monitored during session    Home Living Family/patient expects to be discharged to:: Shelter/Homeless                   Additional Comments: pt reports most recently staying in a friend's truck    Prior Function Prior Level of Function : Independent/Modified Independent;History of Falls (last six months)             Mobility Comments: modI with rollator, reports several falls recently. Room air at baseline ADLs Comments: Pt reports he was independent until recently when he began to have multiple falls     Hand Dominance        Extremity/Trunk Assessment   Upper Extremity Assessment Upper Extremity Assessment: Defer to OT evaluation    Lower Extremity Assessment Lower Extremity Assessment: Generalized weakness (BLE sensitive to touch so formal MMT not performed)    Cervical / Trunk Assessment Cervical / Trunk Assessment: Normal  Communication   Communication: No difficulties  Cognition Arousal/Alertness: Awake/alert Behavior During Therapy: WFL for tasks assessed/performed Overall Cognitive Status: No family/caregiver present to determine baseline cognitive functioning Area of Impairment: Orientation, Following commands, Awareness, Safety/judgement, Problem solving, Attention, Memory                 Orientation Level: Disoriented to, Time (correctly states month but not year) Current Attention Level: Sustained Memory: Decreased short-term memory Following Commands: Follows one step commands with increased time Safety/Judgement: Decreased awareness of safety, Decreased awareness of deficits Awareness: Emergent Problem Solving: Slow processing, Difficulty sequencing, Requires verbal cues General  Comments: pt requiring increased time to follow commands, easily distracted by the TV, but following commands with repeated cues        General Comments General comments (skin integrity, edema, etc.): VSS on 2L    Exercises     Assessment/Plan    PT Assessment Patient needs continued PT services  PT Problem List Decreased strength;Decreased activity tolerance;Decreased mobility;Decreased balance;Decreased cognition;Decreased safety awareness;Decreased knowledge of precautions;Cardiopulmonary status limiting activity       PT Treatment Interventions DME instruction;Gait training;Functional mobility training;Therapeutic activities;Therapeutic exercise;Balance training;Neuromuscular re-education;Cognitive remediation;Patient/family education    PT Goals (Current goals can be found in the Care Plan section)  Acute Rehab PT Goals Patient Stated Goal: get better PT Goal Formulation: With patient Time For Goal Achievement: 02/21/23 Potential to Achieve Goals: Good    Frequency Min 2X/week     Co-evaluation PT/OT/SLP Co-Evaluation/Treatment: Yes Reason for Co-Treatment: Necessary to address cognition/behavior during functional activity;To address functional/ADL transfers PT goals addressed during session: Mobility/safety with mobility;Balance         AM-PAC PT "6 Clicks" Mobility  Outcome Measure Help needed turning from your back to your side while in a flat bed without using bedrails?: A Lot Help needed moving from lying on your back to sitting on the side of a flat bed without using bedrails?: A Lot Help needed moving to and from a bed to a chair (including a wheelchair)?: A Lot Help needed standing up  from a chair using your arms (e.g., wheelchair or bedside chair)?: A Lot Help needed to walk in hospital room?: Total Help needed climbing 3-5 steps with a railing? : Total 6 Click Score: 10    End of Session Equipment Utilized During Treatment: Gait belt;Oxygen Activity  Tolerance: Patient limited by pain Patient left: in bed;with call bell/phone within reach;with bed alarm set;with nursing/sitter in room Nurse Communication: Mobility status PT Visit Diagnosis: Repeated falls (R29.6);Muscle weakness (generalized) (M62.81);Difficulty in walking, not elsewhere classified (R26.2)    Time: [id number] PT Time Calculation (min) (ACUTE ONLY): 19 min   Charges:   PT Evaluation $PT Eval Moderate Complexity: 1 Mod          [name], PT DPT Acute Rehabilitation Services Office [phone]   Luvenia Heller 02/07/2023, 1:24 PM

## 2023-02-07 NOTE — Progress Notes (Signed)
ABI exam has been completed.   Results can be found under chart review under CV PROC. 02/07/2023 5:10 PM Sedalia Greeson RVT, RDMS

## 2023-02-07 NOTE — Progress Notes (Signed)
  Transition of Care [facility]) Screening Note   Patient Details  Name: ACKEEM BAILIN Date of Birth: Jun 15, 1961   [facility] CM/SW Contact:    Benard Halsted, LCSW Phone Number: 02/07/2023, 1:57 PM    Transition of Care Department [facility]) has reviewed patient and acknowledges SNF recommendation. Barriers include lack of insurance and homelessness/ETOH use. We will continue to monitor patient advancement through interdisciplinary progression rounds. If new patient transition needs arise, please place a TOC consult.

## 2023-02-07 NOTE — Progress Notes (Signed)
Initial Nutrition Assessment  DOCUMENTATION CODES:   Not applicable  INTERVENTION:   Continue Folic Acid, Thiamine, and Multivitamin w/ minerals daily Diet ordered per MD request Mighty Shake TID with meals, each supplement provides 330 kcals and 9 grams of protein Meal ordering with assist Encourage good PO intake   NUTRITION DIAGNOSIS:   Increased nutrient needs related to wound healing as evidenced by estimated needs.  GOAL:   Patient will meet greater than or equal to 90% of their needs  MONITOR:   PO intake, Supplement acceptance, Labs, I & O's, Skin  REASON FOR ASSESSMENT:   Consult Assessment of nutrition requirement/status  ASSESSMENT:   62 y.o. male presented to the ED after multiple falls. PMH include EtOH abuse, homeless, and SDH. Pt admitted with hypokalemia, hypomagnesemia, hypophosphatasia, EtOH withdrawal, and BLE cellulitis.   Met with pt in room. Pt reports that his appetite has not been good and that he typically only eats one meal per day. Unable to tell me what that one meal is or what time of day it is. Reports that he vomited this morning after the RN gave him medicine. Denies any recent weight loss. No weight history in EMR to assess accuracy of weight history.   Pt with no diet ordered, discussed with MD during rounds. MD ok with RD ordering pt regular diet. RD provided pt a menu and discussed that [employer] employee would come into room to take meal order.   Medications reviewed and include: Folic acid, MVI, Potassium Chloride, Thiamine, IV antibiotics  Labs reviewed: Sodium 135, Potassium 3.4, Phosphorus 3.7, Magnesium 2.2, Vitamin B12 211, Folate 21.4   NUTRITION - FOCUSED PHYSICAL EXAM:  Flowsheet Row Most Recent Value  Orbital Region No depletion  Upper Arm Region No depletion  Thoracic and Lumbar Region No depletion  Buccal Region No depletion  Temple Region No depletion  Clavicle Bone Region No depletion  Clavicle and Acromion Bone Region  No depletion  Scapular Bone Region No depletion  Dorsal Hand No depletion  Patellar Region No depletion  Anterior Thigh Region No depletion  Posterior Calf Region No depletion   Diet Order:   Diet Order             Diet regular Room service appropriate? Yes with Assist; Fluid consistency: Thin  Diet effective now                   EDUCATION NEEDS:   Not appropriate for education at this time  Skin:  Skin Assessment: Reviewed RN Assessment  Last BM:  Unknown  Height:   Ht Readings from Last 1 Encounters:  02/06/23 '5\' 4"'$  (1.626 m)    Weight:   Wt Readings from Last 1 Encounters:  02/06/23 71.6 kg    Ideal Body Weight:  59.1 kg  BMI:  Body mass index is 27.09 kg/m.  Estimated Nutritional Needs:  Kcal:  1900-2100 Protein:  95-115 grams Fluid:  >/= 1.9 L   Hermina Barters RD, LDN Clinical Dietitian See [facility] for contact information.

## 2023-02-08 ENCOUNTER — Encounter (HOSPITAL_COMMUNITY): Payer: Self-pay | Admitting: Internal Medicine

## 2023-02-08 LAB — GASTROINTESTINAL PANEL BY PCR, STOOL (REPLACES STOOL CULTURE)

## 2023-02-08 LAB — CBC
HCT: 31 % — ABNORMAL LOW (ref 39.0–52.0)
HCT: 31.8 % — ABNORMAL LOW (ref 39.0–52.0)
Hemoglobin: 10.5 g/dL — ABNORMAL LOW (ref 13.0–17.0)
Hemoglobin: 10.9 g/dL — ABNORMAL LOW (ref 13.0–17.0)
MCH: 35.6 pg — ABNORMAL HIGH (ref 26.0–34.0)
MCH: 35.7 pg — ABNORMAL HIGH (ref 26.0–34.0)
MCHC: 33.9 g/dL (ref 30.0–36.0)
MCHC: 34.3 g/dL (ref 30.0–36.0)
MCV: 103.9 fL — ABNORMAL HIGH (ref 80.0–100.0)
MCV: 105.4 fL — ABNORMAL HIGH (ref 80.0–100.0)
Platelets: 85 10*3/uL — ABNORMAL LOW (ref 150–400)
Platelets: 87 10*3/uL — ABNORMAL LOW (ref 150–400)
RBC: 2.94 MIL/uL — ABNORMAL LOW (ref 4.22–5.81)
RBC: 3.06 MIL/uL — ABNORMAL LOW (ref 4.22–5.81)
RDW: 13 % (ref 11.5–15.5)
RDW: 13 % (ref 11.5–15.5)
WBC: 11.4 10*3/uL — ABNORMAL HIGH (ref 4.0–10.5)
WBC: 13.8 10*3/uL — ABNORMAL HIGH (ref 4.0–10.5)
nRBC: 0 % (ref 0.0–0.2)
nRBC: 0 % (ref 0.0–0.2)

## 2023-02-08 LAB — MAGNESIUM
Magnesium: 2 mg/dL (ref 1.7–2.4)
Magnesium: 2.1 mg/dL (ref 1.7–2.4)

## 2023-02-08 LAB — COMPREHENSIVE METABOLIC PANEL
ALT: 41 U/L (ref 0–44)
AST: 61 U/L — ABNORMAL HIGH (ref 15–41)
Albumin: 2.4 g/dL — ABNORMAL LOW (ref 3.5–5.0)
Alkaline Phosphatase: 63 U/L (ref 38–126)
Anion gap: 11 (ref 5–15)
BUN: 10 mg/dL (ref 8–23)
CO2: 21 mmol/L — ABNORMAL LOW (ref 22–32)
Calcium: 7.7 mg/dL — ABNORMAL LOW (ref 8.9–10.3)
Chloride: 102 mmol/L (ref 98–111)
Creatinine, Ser: 0.67 mg/dL (ref 0.61–1.24)
GFR, Estimated: >60 mL/min (ref >60)
Glucose, Bld: 97 mg/dL (ref 70–99)
Potassium: 3.2 mmol/L — ABNORMAL LOW (ref 3.5–5.1)
Sodium: 134 mmol/L — ABNORMAL LOW (ref 135–145)
Total Bilirubin: 0.6 mg/dL (ref 0.3–1.2)
Total Protein: 5.6 g/dL — ABNORMAL LOW (ref 6.5–8.1)

## 2023-02-08 LAB — PHOSPHORUS
Phosphorus: 2.5 mg/dL (ref 2.5–4.6)
Phosphorus: 2.9 mg/dL (ref 2.5–4.6)

## 2023-02-08 LAB — BETA-HYDROXYBUTYRIC ACID: Beta-Hydroxybutyric Acid: 0.13 mmol/L (ref 0.05–0.27)

## 2023-02-08 LAB — MRSA NEXT GEN BY PCR, NASAL: MRSA by PCR Next Gen: DETECTED — AB

## 2023-02-08 LAB — HIV ANTIBODY (ROUTINE TESTING W REFLEX): HIV Screen 4th Generation wRfx: NONREACTIVE

## 2023-02-08 MED ORDER — CHLORHEXIDINE GLUCONATE CLOTH 2 % EX PADS
6.0000 | MEDICATED_PAD | Freq: Every day | CUTANEOUS | Status: AC
  Administered 2023-02-08 – 2023-02-12 (×4): 6 via TOPICAL

## 2023-02-08 MED ORDER — MUPIROCIN 2 % EX OINT
1.0000 | TOPICAL_OINTMENT | Freq: Two times a day (BID) | CUTANEOUS | Status: AC
  Administered 2023-02-08 – 2023-02-12 (×10): 1 via NASAL
  Filled 2023-02-08 (×2): qty 22

## 2023-02-08 MED ORDER — POTASSIUM CHLORIDE CRYS ER 20 MEQ PO TBCR
40.0000 meq | EXTENDED_RELEASE_TABLET | ORAL | Status: AC
  Administered 2023-02-08 (×2): 40 meq via ORAL
  Filled 2023-02-08 (×2): qty 2

## 2023-02-08 NOTE — Evaluation (Signed)
Clinical/Bedside Swallow Evaluation Patient Details  Name: Joseph Bell MRN: [id number] Date of Birth: 05-Feb-1961  Today's Date: 02/08/2023 Time: SLP Start Time (ACUTE ONLY): 1340 SLP Stop Time (ACUTE ONLY): 1350 SLP Time Calculation (min) (ACUTE ONLY): 10 min  Past Medical History: History reviewed. No pertinent past medical history.  HPI:  Patient is a 62 y.o. male with PMH: ETOH use, homelessness who presented to the ED with frequent falls, weakness and was found to be in ETOH withdrawal and left>right leg cellulitis and numerous electrolyte abnormalities. MRI brain: No acute intracranial process, CT angio chest: No PE, circumferential wall thickening of distal half of thoracic esophagus.    Assessment / Plan / Recommendation  Clinical Impression  Patient not presenting with clinical s/s of dysphagia as per this bedside swallow evaluation. He denied any h/o or current swallowing difficulties or GERD symptoms. SLP did not observe any overt s/s of aspiration or penetration with consecutive straw sips of thin liquids (water) and swallow initiation was timely. SLP not recommending f/u at this time. He is missing some teeth and others are in poor condition, which may impact his ability to masticate solids. SLP Visit Diagnosis: Dysphagia, unspecified (R13.10)    Aspiration Risk  No limitations    Diet Recommendation Regular;Thin liquid   Liquid Administration via: Cup;Straw Medication Administration: Whole meds with liquid Supervision: Patient able to self feed Postural Changes: Seated upright at 90 degrees    Other  Recommendations Oral Care Recommendations: Oral care BID    Recommendations for follow up therapy are one component of a multi-disciplinary discharge planning process, led by the attending physician.  Recommendations may be updated based on patient status, additional functional criteria and insurance authorization.  Follow up Recommendations No SLP follow up       Assistance Recommended at Discharge    Functional Status Assessment Patient has not had a recent decline in their functional status  Frequency and Duration   N /A         Prognosis   N/A     Swallow Study   General Date of Onset: 02/07/23 HPI: Patient is a 62 y.o. male with PMH: ETOH use, homelessness who presented to the ED with frequent falls, weakness and was found to be in ETOH withdrawal and left>right leg cellulitis and numerous electrolyte abnormalities. MRI brain: No acute intracranial process, CT angio chest: No PE, circumferential wall thickening of distal half of thoracic esophagus. Type of Study: Bedside Swallow Evaluation Previous Swallow Assessment: none found Diet Prior to this Study: Regular;Thin liquids (Level 0) Temperature Spikes Noted: No Respiratory Status: Room air History of Recent Intubation: No Behavior/Cognition: Alert;Cooperative;Pleasant mood Oral Cavity Assessment: Within Functional Limits Oral Care Completed by SLP: No Oral Cavity - Dentition: Poor condition;Missing dentition Vision: Functional for self-feeding Self-Feeding Abilities: Able to feed self Patient Positioning: Upright in bed Baseline Vocal Quality: Normal Volitional Cough: Strong Volitional Swallow: Able to elicit    Oral/Motor/Sensory Function Overall Oral Motor/Sensory Function: Within functional limits   Ice Chips     Thin Liquid Thin Liquid: Within functional limits Presentation: Straw;Self Fed    Nectar Thick     Honey Thick     Puree Puree: Not tested   Solid     Solid: Not tested      Sonia Baller, MA, CCC-SLP Speech Therapy

## 2023-02-08 NOTE — Progress Notes (Addendum)
PROGRESS NOTE        PATIENT DETAILS Name: Joseph Bell Age: 62 y.o. Sex: male Date of Birth: Oct 28, 1961 Admit Date: 02/06/2023 Admitting Physician Evalee Mutton Kristeen Mans, MD QP:[id number], No Pcp Per  Brief Summary: Patient is a 62 y.o.  male with history of EtOH use, homelessness-presented to the ED with frequent falls, weakness-was found to have EtOH withdrawal, left> right leg cellulitis and numerous electrolyte abnormalities.  He was subsequently admitted to the [facility].  Significant events: 3/4>> admit to [facility]  Significant studies: 3/4>> CT angio chest: No PE, circumferential wall thickening of distal half of thoracic esophagus. 3/4>> CT abdomen/pelvis: No acute pathology. 3/4>> CT C-spine: No acute findings. 3/4>> x-ray right knee: No fracture 3/4>> x-ray right fibula/tibia: No fracture 3/4>> x-ray left fibula/tibia: No fracture 3/5>> MRI brain: No acute intracranial process  Significant microbiology data: 3/4>> COVID/influenza/RSV PCR: Negative 3/4>> respiratory virus panel: Negative 3/4>> blood culture: Negative  Procedures: None  Consults: None  Subjective: Much more awake and alert today claims he has been living in his [name] for the past 5 years or so.  Claims he has been walking with the help of a walker for at least 6 months.  No major issues overnight-no chest pain/shortness of breath.  Answering all my questions appropriately.  Objective: Vitals: Blood pressure 119/80, pulse 81, temperature 98.6 F (37 C), temperature source Oral, resp. rate (!) 21, height '5\' 4"'$  (1.626 m), weight 71.6 kg, SpO2 93 %.   Exam: Gen Exam:Alert awake-not in any distress HEENT:atraumatic, normocephalic Chest: B/L clear to auscultation anteriorly CVS:S1S2 regular Abdomen:soft non tender, non distended Extremities:no edema-significant improvement in erythema overnight. Neurology: Non focal-but w gen weakness Skin: no rash  Pertinent  Labs/Radiology:    Latest Ref Rng & Units 02/08/2023    6:38 AM 02/07/2023   11:57 PM 02/07/2023    6:42 AM  CBC  WBC 4.0 - 10.5 K/uL 11.4  13.8  17.2   Hemoglobin 13.0 - 17.0 g/dL 10.5  10.9  11.3   Hematocrit 39.0 - 52.0 % 31.0  31.8  32.5   Platelets 150 - 400 K/uL 85  87  82     Lab Results  Component Value Date   NA 134 (L) 02/08/2023   K 3.2 (L) 02/08/2023   CL 102 02/08/2023   CO2 21 (L) 02/08/2023      Assessment/Plan: Left> right leg cellulitis Significant improvement in erythema Leukocytosis has essentially resolved Continue Rocephin Follow cultures s  Frequent falls Likely in the setting of EtOH/chronic debility/chronic ambulatory dysfunction (uses cane at baseline)/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   Will continue to counsel but do not think he has any intention of quitting at this point.    Hypokalemia/hypomagnesemia/hypophosphatemia In the setting of EtOH use Continue to replete.  Prolonged QTc Suspect due to electrolyte abnormalities Please repeat twelve-lead EKG on 3/6 with resolution of prolonged QTc.  Thrombocytopenia In the setting of EtOH use Mild-stable for periodic follow-up  Circumferential wall thickening of the distal half of the thoracic esophagus Incidental finding on CTA chest Continue PPI-although he is not overtly symptomatic. Will need outpatient follow-up with GI  Nodular opacity right upper lobe, pleural-based density left upper lobe Incidental  finding on CT chest Radiology recommending repeat CT chest in 3 months  Bilateral pretibial lower extremity wounds Present prior to admission Wound care team following Continue dressing per wound care team  Homelessness Lives in van-found covered with feces Per ED documentation Social worker  eval  BMI: Estimated body mass index is 27.09 kg/m as calculated from the following:   Height as of this encounter: '5\' 4"'$  (1.626 m).   Weight as of this encounter: 71.6 kg.   Code status:   Code Status: Full Code   DVT Prophylaxis: SCDs Start: 02/07/23 2313 Place and maintain sequential compression device Start: 02/07/23 1345SCDs given thrombocytopenia   Family Communication: None at bedside   Disposition Plan: Status is: Observation The patient will require care spanning > 2 midnights and should be moved to inpatient because: Severity of illness   Planned Discharge Destination:Home versus SNF   Diet: Diet Order             Diet regular Room service appropriate? Yes with Assist; Fluid consistency: Thin  Diet effective now                     Antimicrobial agents: Anti-infectives (From admission, onward)    Start     Dose/Rate Route Frequency Ordered Stop   02/08/23 0000  vancomycin (VANCOCIN) IVPB 1000 mg/200 mL premix  Status:  Discontinued        1,000 mg 200 mL/hr over 60 Minutes Intravenous  Once 02/07/23 2312 02/07/23 2317   02/08/23 0000  ceFEPIme (MAXIPIME) 2 g in sodium chloride 0.9 % 100 mL IVPB  Status:  Discontinued        2 g 200 mL/hr over 30 Minutes Intravenous  Once 02/07/23 2312 02/07/23 2319   02/07/23 1600  vancomycin (VANCOREADY) IVPB 750 mg/150 mL  Status:  Discontinued       See Hyperspace for full Linked Orders Report.   750 mg 150 mL/hr over 60 Minutes Intravenous Every 12 hours 02/06/23 2113 02/07/23 0957   02/07/23 1600  cefTRIAXone (ROCEPHIN) 2 g in sodium chloride 0.9 % 100 mL IVPB        2 g 200 mL/hr over 30 Minutes Intravenous Every 24 hours 02/07/23 0957     02/07/23 0600  ceFEPIme (MAXIPIME) 2 g in sodium chloride 0.9 % 100 mL IVPB  Status:  Discontinued        2 g 200 mL/hr over 30 Minutes Intravenous Every 8 hours 02/06/23 2113 02/07/23 0957   02/06/23 2115  vancomycin (VANCOREADY) IVPB 1750 mg/350 mL       See Hyperspace  for full Linked Orders Report.   1,750 mg 175 mL/hr over 120 Minutes Intravenous  Once 02/06/23 2113 02/07/23 0455   02/06/23 1615  cefTRIAXone (ROCEPHIN) 2 g in sodium chloride 0.9 % 100 mL IVPB        2 g 200 mL/hr over 30 Minutes Intravenous STAT 02/06/23 1611 02/06/23 1824   02/06/23 1615  doxycycline (VIBRAMYCIN) 100 mg in sodium chloride 0.9 % 250 mL IVPB        100 mg 125 mL/hr over 120 Minutes Intravenous  Once 02/06/23 1611 02/06/23 1947        MEDICATIONS: Scheduled Meds:  Chlorhexidine Gluconate Cloth  6 each Topical 99991111   folic acid  1 mg Oral Daily   guaiFENesin  600 mg Oral BID   ipratropium-albuterol  3 mL Nebulization BID   LORazepam  0-4 mg Oral Q6H   Followed by  LORazepam  0-4 mg Oral Q12H    morphine injection  1 mg Intravenous Once   multivitamin with minerals  1 tablet Oral Daily   mupirocin ointment  1 Application Nasal BID   nicotine  14 mg Transdermal Daily   nystatin   Topical TID   pantoprazole  40 mg Oral Q1200   thiamine  100 mg Oral Daily   Or   thiamine  100 mg Intravenous Daily   Continuous Infusions:  cefTRIAXone (ROCEPHIN)  IV Stopped (02/07/23 1634)   PRN Meds:.acetaminophen **OR** acetaminophen, albuterol, oxyCODONE-acetaminophen, Racepinephrine HCl   I have personally reviewed following labs and imaging studies  LABORATORY DATA: CBC: Recent Labs  Lab 02/06/23 0930 02/06/23 1333 02/06/23 2119 02/06/23 2142 02/07/23 0642 02/07/23 2357 02/08/23 0638  WBC 3.8*  --  19.5*  --  17.2* 13.8* 11.4*  HGB 12.6*   < > 12.0* 11.9* 11.3* 10.9* 10.5*  HCT 35.3*   < > 35.2* 35.0* 32.5* 31.8* 31.0*  MCV 100.3*  --  104.5*  --  102.5* 103.9* 105.4*  PLT 78*  --  83*  --  82* 87* 85*   < > = values in this interval not displayed.     Basic Metabolic Panel: Recent Labs  Lab 02/06/23 0930 02/06/23 1333 02/06/23 2119 02/06/23 2142 02/07/23 0642 02/07/23 2357 02/08/23 0638  NA 131* 132* 133* 135 135  --  134*  K 2.9* 3.0* 3.7  3.8 3.4*  --  3.2*  CL 95* 95* 99  --  102  --  102  CO2 21*  --  22  --  27  --  21*  GLUCOSE 79 86 103*  --  94  --  97  BUN 7* 7* 9  --  8  --  10  CREATININE 0.95 0.80 0.88  --  0.82  --  0.67  CALCIUM 8.1*  --  8.0*  --  7.6*  --  7.7*  MG 1.3*  --  2.0  --  2.2 2.1 2.0  PHOS 1.7*  --   --   --  3.7 2.5 2.9     GFR: Estimated Creatinine Clearance: 86.9 mL/min (by C-G formula based on SCr of 0.67 mg/dL).  Liver Function Tests: Recent Labs  Lab 02/06/23 0930 02/06/23 2119 02/07/23 0642 02/08/23 0638  AST 81* 131* 117* 61*  ALT 29 45* 52* 41  ALKPHOS 151* 62 54 63  BILITOT 1.6* 0.7 0.8 0.6  PROT 6.3* 6.3* 5.9* 5.6*  ALBUMIN 3.0* 2.8* 2.5* 2.4*    Recent Labs  Lab 02/06/23 2119  LIPASE 29    Recent Labs  Lab 02/06/23 1952  AMMONIA 21     Coagulation Profile: Recent Labs  Lab 02/06/23 1952  INR 1.2     Cardiac Enzymes: Recent Labs  Lab 02/06/23 0930  CKTOTAL 216     BNP (last 3 results) No results for input(s): "PROBNP" in the last 8760 hours.  Lipid Profile: No results for input(s): "CHOL", "HDL", "LDLCALC", "TRIG", "CHOLHDL", "LDLDIRECT" in the last 72 hours.  Thyroid Function Tests: Recent Labs    02/06/23 2119  TSH 3.039     Anemia Panel: Recent Labs    02/06/23 2119  VITAMINB12 211  FOLATE 21.4  FERRITIN 581*  TIBC 231*  IRON 15*  RETICCTPCT 1.5     Urine analysis: No results found for: "COLORURINE", "APPEARANCEUR", "LABSPEC", "PHURINE", "GLUCOSEU", "HGBUR", "BILIRUBINUR", "KETONESUR", "PROTEINUR", "UROBILINOGEN", "NITRITE", "LEUKOCYTESUR"  Sepsis Labs: Lactic Acid, Venous  Component Value Date/Time   LATICACIDVEN 1.9 02/06/2023 2352    MICROBIOLOGY: Recent Results (from the past 240 hour(s))  Resp panel by RT-PCR (RSV, Flu A&B, Covid) Anterior Nasal Swab     Status: None   Collection Time: 02/06/23  9:39 AM   Specimen: Anterior Nasal Swab  Result Value Ref Range Status   SARS Coronavirus 2 by RT PCR NEGATIVE  NEGATIVE Final   Influenza A by PCR NEGATIVE NEGATIVE Final   Influenza B by PCR NEGATIVE NEGATIVE Final    Comment: (NOTE) The Xpert Xpress SARS-CoV-2/FLU/RSV plus assay is intended as an aid in the diagnosis of influenza from Nasopharyngeal swab specimens and should not be used as a sole basis for treatment. Nasal washings and aspirates are unacceptable for Xpert Xpress SARS-CoV-2/FLU/RSV testing.  Fact Sheet for Patients: EntrepreneurPulse.com.au  Fact Sheet for Healthcare Providers: IncredibleEmployment.be  This test is not yet approved or cleared by the [place] FDA and has been authorized for detection and/or diagnosis of SARS-CoV-2 by FDA under an Emergency Use Authorization (EUA). This EUA will remain in effect (meaning this test can be used) for the duration of the COVID-19 declaration under Section 564(b)(1) of the Act, 21 U.S.C. section 360bbb-3(b)(1), unless the authorization is terminated or revoked.     Resp Syncytial Virus by PCR NEGATIVE NEGATIVE Final    Comment: (NOTE) Fact Sheet for Patients: EntrepreneurPulse.com.au  Fact Sheet for Healthcare Providers: IncredibleEmployment.be  This test is not yet approved or cleared by the [place] FDA and has been authorized for detection and/or diagnosis of SARS-CoV-2 by FDA under an Emergency Use Authorization (EUA). This EUA will remain in effect (meaning this test can be used) for the duration of the COVID-19 declaration under Section 564(b)(1) of the Act, 21 U.S.C. section 360bbb-3(b)(1), unless the authorization is terminated or revoked.  Performed at [facility], [place] [address] [place], [place] [address]   MRSA Next Gen by PCR, Nasal     Status: Abnormal   Collection Time: 02/06/23  9:06 PM   Specimen: Nasal Mucosa; Nasal Swab  Result Value Ref Range Status   MRSA by PCR Next Gen DETECTED (A) NOT DETECTED Final     Comment: RESULT CALLED TO, READ BACK BY AND VERIFIED WITH: K JOHNS,RN'@0232'$  02/08/23 [place] (NOTE) The GeneXpert MRSA Assay (FDA approved for NASAL specimens only), is one component of a comprehensive MRSA colonization surveillance program. It is not intended to diagnose MRSA infection nor to guide or monitor treatment for MRSA infections. Test performance is not FDA approved in patients less than 45 years old. Performed at [facility], [place] [address], [place], [place] [address]   Culture, blood (x 2)     Status: None (Preliminary result)   Collection Time: 02/06/23  9:19 PM   Specimen: BLOOD  Result Value Ref Range Status   Specimen Description BLOOD RIGHT ANTECUBITAL  Final   Special Requests   Final    BOTTLES DRAWN AEROBIC AND ANAEROBIC Blood Culture adequate volume   Culture   Final    NO GROWTH 2 DAYS Performed at [facility], [place] [address] [place], [place] [address]    Report Status PENDING  Incomplete  Culture, blood (x 2)     Status: None (Preliminary result)   Collection Time: 02/06/23  9:37 PM   Specimen: BLOOD  Result Value Ref Range Status   Specimen Description BLOOD SITE NOT SPECIFIED  Final   Special Requests   Final  BOTTLES DRAWN AEROBIC AND ANAEROBIC Blood Culture adequate volume   Culture   Final    NO GROWTH 2 DAYS Performed at [facility], [place] [address] [place], [place] [address]    Report Status PENDING  Incomplete  Respiratory (~20 pathogens) panel by PCR     Status: None   Collection Time: 02/06/23  9:41 PM   Specimen: Nasopharyngeal Swab; Respiratory  Result Value Ref Range Status   Adenovirus NOT DETECTED NOT DETECTED Final   Coronavirus 229E NOT DETECTED NOT DETECTED Final    Comment: (NOTE) The Coronavirus on the Respiratory Panel, DOES NOT test for the novel  Coronavirus (2019 nCoV)    Coronavirus HKU1 NOT DETECTED NOT DETECTED Final   Coronavirus NL63 NOT DETECTED NOT DETECTED Final   Coronavirus OC43 NOT DETECTED NOT  DETECTED Final   Metapneumovirus NOT DETECTED NOT DETECTED Final   Rhinovirus / Enterovirus NOT DETECTED NOT DETECTED Final   Influenza A NOT DETECTED NOT DETECTED Final   Influenza B NOT DETECTED NOT DETECTED Final   Parainfluenza Virus 1 NOT DETECTED NOT DETECTED Final   Parainfluenza Virus 2 NOT DETECTED NOT DETECTED Final   Parainfluenza Virus 3 NOT DETECTED NOT DETECTED Final   Parainfluenza Virus 4 NOT DETECTED NOT DETECTED Final   Respiratory Syncytial Virus NOT DETECTED NOT DETECTED Final   Bordetella pertussis NOT DETECTED NOT DETECTED Final   Bordetella Parapertussis NOT DETECTED NOT DETECTED Final   Chlamydophila pneumoniae NOT DETECTED NOT DETECTED Final   Mycoplasma pneumoniae NOT DETECTED NOT DETECTED Final    Comment: Performed at [facility], [place]. [address], [place], [place] [address]    RADIOLOGY STUDIES/RESULTS: VAS [place] ABI WITH/WO TBI  Result Date: 02/07/2023  LOWER EXTREMITY DOPPLER STUDY Patient Name:  Joseph Bell  Date of Exam:   02/07/2023 Medical Rec #: [id number]           Accession #:    [id number] Date of Birth: [birth year]            Patient Gender: M Patient Age:   6 years Exam Location:  [facility] Procedure:      VAS [place] ABI WITH/WO TBI Referring Phys: Nyoka Lint DOUTOVA --------------------------------------------------------------------------------  Indications: Pain (BLE cellulitis) High Risk Factors: Current smoker.  Comparison Study: No previous exams Performing Technologist: Hill, Jody RVT, RDMS  Examination Guidelines: A complete evaluation includes at minimum, Doppler waveform signals and systolic blood pressure reading at the level of bilateral brachial, anterior tibial, and posterior tibial arteries, when vessel segments are accessible. Bilateral testing is considered an integral part of a complete examination. Photoelectric Plethysmograph (PPG) waveforms and toe systolic pressure readings are included as required and additional duplex  testing as needed. Limited examinations for reoccurring indications may be performed as noted.  ABI Findings: +---------+------------------+-----+---------+--------+ Right    Rt Pressure (mmHg)IndexWaveform Comment  +---------+------------------+-----+---------+--------+ Brachial 103                    triphasic         +---------+------------------+-----+---------+--------+ PTA      115               1.12 triphasic         +---------+------------------+-----+---------+--------+ DP       104               1.01 triphasic         +---------+------------------+-----+---------+--------+ Matthew Saras  0.76 Normal            +---------+------------------+-----+---------+--------+ +---------+------------------+-----+---------+-------+ Left     Lt Pressure (mmHg)IndexWaveform Comment +---------+------------------+-----+---------+-------+ Brachial 99                     triphasic        +---------+------------------+-----+---------+-------+ PTA      116               1.13 triphasic        +---------+------------------+-----+---------+-------+ DP       102               0.99 biphasic         +---------+------------------+-----+---------+-------+ Great Toe64                0.62 Normal           +---------+------------------+-----+---------+-------+  Summary: Right: Resting right ankle-brachial index is within normal range. The right toe-brachial index is normal. Left: Resting left ankle-brachial index is within normal range. The left toe-brachial index is mildly abnormal. *See table(s) above for measurements and observations.  Electronically signed by Harold Barban MD on 02/07/2023 at 10:09:29 PM.    Final    MR BRAIN WO CONTRAST  Result Date: 02/07/2023 CLINICAL DATA:  Right lower extremity weakness and numbness for several days EXAM: MRI HEAD WITHOUT CONTRAST TECHNIQUE: Multiplanar, multiecho pulse sequences of the brain and surrounding structures were  obtained without intravenous contrast. COMPARISON:  No prior MRI brain available, correlation is made with CT head 02/06/2023 FINDINGS: Brain: No restricted diffusion to suggest acute or subacute infarct. No acute hemorrhage, mass, mass effect, or midline shift. No hydrocephalus or extra-axial collection. Normal pituitary and craniocervical junction. No hemosiderin deposition to suggest remote hemorrhage. Advanced cerebral atrophy for age. Vascular: Normal arterial flow voids. Skull and upper cervical spine: Normal marrow signal. Sinuses/Orbits: Mucosal thickening in the right maxillary sinus and anterior ethmoid air cells. No acute finding in the orbits. Other: Fluid throughout bilateral mastoid air cells. IMPRESSION: 1. No acute intracranial process. 2. Advanced cerebral atrophy for age. Electronically Signed   By: Merilyn Baba M.D.   On: 02/07/2023 02:25   CT ABDOMEN PELVIS W CONTRAST  Result Date: 02/06/2023 CLINICAL DATA:  Blunt abdominal trauma. EXAM: CT ABDOMEN AND PELVIS WITH CONTRAST TECHNIQUE: Multidetector CT imaging of the abdomen and pelvis was performed using the standard protocol following bolus administration of intravenous contrast. RADIATION DOSE REDUCTION: This exam was performed according to the departmental dose-optimization program which includes automated exposure control, adjustment of the mA and/or kV according to patient size and/or use of iterative reconstruction technique. CONTRAST:  66m OMNIPAQUE IOHEXOL 350 MG/ML SOLN COMPARISON:  None Available. FINDINGS: Evaluation is limited due to streak artifact caused by patient's arms. Lower chest: Partially visualized trace left pleural effusion versus pleural thickening. The visualized lung bases are otherwise clear. No intra-abdominal free air or free fluid. Hepatobiliary: Fatty liver. No biliary dilatation. The gallbladder is unremarkable. Pancreas: Unremarkable. No pancreatic ductal dilatation or surrounding inflammatory changes.  Spleen: Normal in size without focal abnormality. Adrenals/Urinary Tract: The adrenal glands are unremarkable. The kidneys, visualized ureters appear unremarkable. There is diffuse thickened appearance of the bladder wall which may be partly related to underdistention. Correlation with urinalysis recommended to evaluate for cystitis. Stomach/Bowel: There are small scattered colonic diverticula without active inflammatory changes. There is no bowel obstruction or active inflammation. The appendix is normal. Vascular/Lymphatic: Mild aortoiliac atherosclerotic disease. The IVC is unremarkable. Six no portal  venous gas. There is no adenopathy. Reproductive: Prostate and seminal vesicles are grossly unremarkable. No pelvic mass. Other: None Musculoskeletal: Degenerative changes of the spine. No acute osseous pathology. IMPRESSION: 1. No acute intra-abdominal or pelvic pathology. 2. Fatty liver. 3. Small scattered colonic diverticula. No bowel obstruction. Normal appendix. 4. Partially visualized trace left pleural effusion versus pleural thickening. 5.  Aortic Atherosclerosis (ICD10-I70.0). Electronically Signed   By: Anner Crete M.D.   On: 02/06/2023 22:42   DG Tibia/Fibula Right Port  Result Date: 02/06/2023 CLINICAL DATA:  Knee injury EXAM: PORTABLE RIGHT TIBIA AND FIBULA - 2 VIEW COMPARISON:  None Available. FINDINGS: There is no evidence of fracture or other focal bone lesions. Soft tissues are unremarkable. IMPRESSION: Negative. Electronically Signed   By: Rolm Baptise M.D.   On: 02/06/2023 21:57   DG Tibia/Fibula Left Port  Result Date: 02/06/2023 CLINICAL DATA:  Ulcers both lower extremities. EXAM: PORTABLE LEFT TIBIA AND FIBULA - 2 VIEW COMPARISON:  None Available. FINDINGS: There is no evidence of fracture or other focal bone lesions. Soft tissues are unremarkable. No soft tissue gas or radiopaque foreign body. IMPRESSION: Negative. Electronically Signed   By: Rolm Baptise M.D.   On: 02/06/2023 21:57    DG Knee 1-2 Views Right  Result Date: 02/06/2023 CLINICAL DATA:  Knee injury EXAM: RIGHT KNEE - 1-2 VIEW COMPARISON:  08/31/2022 FINDINGS: No evidence of fracture, dislocation, or joint effusion. No evidence of arthropathy or other focal bone abnormality. Soft tissues are unremarkable. IMPRESSION: Negative. Electronically Signed   By: Rolm Baptise M.D.   On: 02/06/2023 21:56   CT Cervical Spine Wo Contrast  Result Date: 02/06/2023 CLINICAL DATA:  Fall, scalp contusions. EXAM: CT CERVICAL SPINE WITHOUT CONTRAST TECHNIQUE: Multidetector CT imaging of the cervical spine was performed without intravenous contrast. Multiplanar CT image reconstructions were also generated. RADIATION DOSE REDUCTION: This exam was performed according to the departmental dose-optimization program which includes automated exposure control, adjustment of the mA and/or kV according to patient size and/or use of iterative reconstruction technique. COMPARISON:  01/31/2022 FINDINGS: Despite efforts by the technologist and patient, motion artifact is present on today's exam and could not be eliminated. This reduces exam sensitivity and specificity. Alignment: Loss of the normal cervical lordosis, which can be associated with muscle spasm. Skull base and vertebrae: No acute fracture or acute bony findings. Soft tissues and spinal canal: Mild bilateral common carotid atherosclerotic vascular calcification. Disc levels: Moderate right and mild left foraminal stenosis at C5-6 due to disc bulge and uncinate spurring. Borderline bilateral foraminal stenosis at C6-7 likewise due to disc bulge and mild uncinate spurring. Upper chest: Unremarkable Other: The scout image demonstrates deformity compatible with healed fracture in the right humeral diaphysis and left midclavicle. IMPRESSION: 1. No acute cervical spine findings. 2. Loss of the normal cervical lordosis, which can be associated with muscle spasm. 3. Cervical spondylosis and degenerative  disc disease causing mild-to-moderate foraminal impingement at C5-6 and borderline foraminal stenosis at C6-7. 4. Mild bilateral common carotid atherosclerotic vascular calcification. Electronically Signed   By: Van Clines M.D.   On: 02/06/2023 18:13   CT Head Wo Contrast  Result Date: 02/06/2023 CLINICAL DATA:  Head trauma, fall, history of subdural hematoma. EXAM: CT HEAD WITHOUT CONTRAST TECHNIQUE: Contiguous axial images were obtained from the base of the skull through the vertex without intravenous contrast. RADIATION DOSE REDUCTION: This exam was performed according to the departmental dose-optimization program which includes automated exposure control, adjustment of the mA and/or kV  according to patient size and/or use of iterative reconstruction technique. COMPARISON:  11/30/2022 FINDINGS: Brain: Previous subdural hematoma along the right falx and tentorium has intervally cleared. The brainstem, cerebellum, cerebral peduncles, thalami, basal ganglia, basilar cisterns, and ventricular system appear within normal limits. No intracranial hemorrhage, mass lesion, or acute CVA. Vascular: Unremarkable Skull: Unremarkable Sinuses/Orbits: Old nasal bone and left zygomatic arch fractures. Chronic right maxillary sinusitis and mild chronic ethmoid sinusitis. Small mastoid effusions bilaterally. Other: Scalp hematomas along the bilateral parietal region, image 25 series 4. IMPRESSION: 1. No acute intracranial findings. Previous subdural hematoma along the right falx and tentorium has completely cleared. 2. Scalp hematomas along the bilateral parietal region. 3. 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 4. Chronic right maxillary sinusitis and mild chronic ethmoid sinusitis. Small mastoid effusions bilaterally. Electronically Signed   By: Van Clines M.D.   On: 02/06/2023 18:05   CT Angio Chest PE W and/or Wo Contrast  Result Date: 02/06/2023 CLINICAL DATA:  Fall, bruising on the right ribs.  EXAM: CT ANGIOGRAPHY CHEST WITH CONTRAST TECHNIQUE: Multidetector CT imaging of the chest was performed using the standard protocol during bolus administration of intravenous contrast. Multiplanar CT image reconstructions and MIPs were obtained to evaluate the vascular anatomy. RADIATION DOSE REDUCTION: This exam was performed according to the departmental dose-optimization program which includes automated exposure control, adjustment of the mA and/or kV according to patient size and/or use of iterative reconstruction technique. CONTRAST:  8m OMNIPAQUE IOHEXOL 350 MG/ML SOLN COMPARISON:  Chest radiograph 02/06/2023 FINDINGS: Cardiovascular: No filling defect is identified in the pulmonary arterial tree to suggest pulmonary embolus. Coronary, aortic arch, and branch vessel atherosclerotic vascular disease. Mild cardiomegaly. Mediastinum/Nodes: No pathologic adenopathy. Circumferential wall thickening in the distal half of the thoracic esophagus extending to the GE junction. This may be a manifestation of esophagitis although is technically nonspecific. Distal paraesophageal lymph node 0.5 cm in short axis on image 94 series 5, possibly reactive. Lungs/Pleura: Scattered mild linear subsegmental atelectasis or scarring. Airway thickening is present, suggesting bronchitis or reactive airways disease. There is frothy fluid in both the right and left mainstem bronchi. Mild dependent atelectasis in the left lower lobe. Nodularity associated with right upper lobe bandlike density on image 27 series 6, nodular component 9 by 7 by 9 mm (volume = 300 mm^3). Pleural-based nodular density in the left upper lobe just above the major fissure, 6 by 11 by 9 mm (volume = 300 mm^3) on image 40 of series 6. Upper Abdomen: Diffuse hepatic steatosis. Musculoskeletal: Mild midthoracic spondylosis. Review of the MIP images confirms the above findings. IMPRESSION: 1. No filling defect is identified in the pulmonary arterial tree to  suggest pulmonary embolus. 2. Nodularity associated with right upper lobe bandlike density nodular component 9 by 7 by 9 mm (volume = 300 mm^3). Pleural-based nodular density in the left upper lobe just above the major fissure, 6 by 11 by 9 mm (volume = 300 mm^3). Per Fleischner Society Guidelines, consider a non-contrast Chest CT at 3 months, a PET/CT, or tissue sampling. These guidelines do not apply to immunocompromised patients and patients with cancer. Follow up in patients with significant comorbidities as clinically warranted. For lung cancer screening, adhere to Lung-RADS guidelines. Reference: Radiology. 2017; 284(1):228-43. 3. Circumferential wall thickening in the distal half of the thoracic esophagus extending to the GE junction. This may be a manifestation of esophagitis although is technically nonspecific. 4. Airway thickening is present, suggesting bronchitis or reactive airways disease. Frothy fluid in  both the right and left mainstem bronchi. 5. Coronary, aortic arch, and branch vessel atherosclerotic vascular disease. Mild cardiomegaly. 6. Diffuse hepatic steatosis. 7. Aortic and coronary atherosclerosis. Aortic Atherosclerosis (ICD10-I70.0). Electronically Signed   By: Van Clines M.D.   On: 02/06/2023 18:01     LOS: 1 day   Oren Binet, MD  [facility]    To contact the attending provider between 7A-7P or the covering provider during after hours 7P-7A, please log into the web site [email or web address] and access using universal [place] password for that web site. If you do not have the password, please call the hospital operator.  02/08/2023, 10:46 AM

## 2023-02-08 NOTE — Plan of Care (Signed)
  Problem: Fluid Volume: Goal: Hemodynamic stability will improve Outcome: Progressing   Problem: Clinical Measurements: Goal: Diagnostic test results will improve Outcome: Progressing Goal: Signs and symptoms of infection will decrease Outcome: Progressing   Problem: Respiratory: Goal: Ability to maintain adequate ventilation will improve Outcome: Progressing   Problem: Clinical Measurements: Goal: Ability to avoid or minimize complications of infection will improve Outcome: Progressing   Problem: Skin Integrity: Goal: Skin integrity will improve Outcome: Progressing   Problem: Education: Goal: Knowledge of General Education information will improve Description: Including pain rating scale, medication(s)/side effects and non-pharmacologic comfort measures Outcome: Progressing   Problem: Health Behavior/Discharge Planning: Goal: Ability to manage health-related needs will improve Outcome: Progressing   Problem: Clinical Measurements: Goal: Ability to maintain clinical measurements within normal limits will improve Outcome: Progressing Goal: Will remain free from infection Outcome: Progressing Goal: Diagnostic test results will improve Outcome: Progressing Goal: Respiratory complications will improve Outcome: Progressing Goal: Cardiovascular complication will be avoided Outcome: Progressing   Problem: Activity: Goal: Risk for activity intolerance will decrease Outcome: Progressing   Problem: Nutrition: Goal: Adequate nutrition will be maintained Outcome: Progressing   Problem: Coping: Goal: Level of anxiety will decrease Outcome: Progressing   Problem: Elimination: Goal: Will not experience complications related to bowel motility Outcome: Progressing Goal: Will not experience complications related to urinary retention Outcome: Progressing   Problem: Pain Managment: Goal: General experience of comfort will improve Outcome: Progressing   Problem:  Safety: Goal: Ability to remain free from injury will improve Outcome: Progressing   Problem: Skin Integrity: Goal: Risk for impaired skin integrity will decrease Outcome: Progressing

## 2023-02-09 LAB — BASIC METABOLIC PANEL
Anion gap: 6 (ref 5–15)
BUN: 5 mg/dL — ABNORMAL LOW (ref 8–23)
CO2: 25 mmol/L (ref 22–32)
Calcium: 8.1 mg/dL — ABNORMAL LOW (ref 8.9–10.3)
Chloride: 99 mmol/L (ref 98–111)
Creatinine, Ser: 0.59 mg/dL — ABNORMAL LOW (ref 0.61–1.24)
GFR, Estimated: >60 mL/min (ref >60)
Glucose, Bld: 93 mg/dL (ref 70–99)
Potassium: 3.4 mmol/L — ABNORMAL LOW (ref 3.5–5.1)
Sodium: 130 mmol/L — ABNORMAL LOW (ref 135–145)

## 2023-02-09 LAB — MAGNESIUM: Magnesium: 1.4 mg/dL — ABNORMAL LOW (ref 1.7–2.4)

## 2023-02-09 LAB — PHOSPHORUS: Phosphorus: 2.6 mg/dL (ref 2.5–4.6)

## 2023-02-09 MED ORDER — POTASSIUM CHLORIDE CRYS ER 20 MEQ PO TBCR
40.0000 meq | EXTENDED_RELEASE_TABLET | ORAL | Status: AC
  Administered 2023-02-09 (×2): 40 meq via ORAL
  Filled 2023-02-09 (×2): qty 2

## 2023-02-09 MED ORDER — MAGNESIUM SULFATE 4 GM/100ML IV SOLN
4.0000 g | Freq: Once | INTRAVENOUS | Status: AC
  Administered 2023-02-09: 4 g via INTRAVENOUS
  Filled 2023-02-09 (×2): qty 100

## 2023-02-09 NOTE — Progress Notes (Addendum)
PROGRESS NOTE        PATIENT DETAILS Name: Joseph Bell Age: 62 y.o. Sex: male Date of Birth: August 11, 1961 Admit Date: 02/06/2023 Admitting Physician Evalee Mutton Kristeen Mans, MD BP:[id number], No Pcp Per  Brief Summary: Patient is a 62 y.o.  male with history of EtOH use, homelessness-presented to the ED with frequent falls, weakness-was found to have EtOH withdrawal, left> right leg cellulitis and numerous electrolyte abnormalities.  He [facility].  Significant events: 3/4>> admit to [facility]  Significant studies: 3/4>> CT angio chest: No PE, circumferential wall thickening of distal half of thoracic esophagus. 3/4>> CT abdomen/pelvis: No acute pathology. 3/4>> CT C-spine: No acute findings. 3/4>> x-ray right knee: No fracture 3/4>> x-ray right fibula/tibia: No fracture 3/4>> x-ray left fibula/tibia: No fracture 3/5>> MRI brain: No acute intracranial process  Significant microbiology data: 3/4>> COVID/influenza/RSV PCR: Negative 3/4>> respiratory virus panel: Negative 3/4>> blood culture: Negative  Procedures: None  Consults: None  Subjective: No major issues overnight.  Completely awake/alert.  No tremors.  Acknowledges some neuropathic symptoms in his lower extremities for several months.  Objective: Vitals: Blood pressure 138/79, pulse 82, temperature 97.8 F (36.6 C), temperature source Oral, resp. rate 12, height '5\' 4"'$  (1.626 m), weight 71.6 kg, SpO2 91 %.   Exam: Gen Exam:Alert awake-not in any distress HEENT:atraumatic, normocephalic Chest: B/L clear to auscultation anteriorly CVS:S1S2 regular Abdomen:soft non tender, non distended Extremities:no edema Neurology: Non focal-history of chronic mild lower extremity weakness. Skin: no rash  Pertinent Labs/Radiology:    Latest Ref Rng & Units 02/08/2023    6:38 AM 02/07/2023   11:57 PM 02/07/2023    6:42 AM  CBC  WBC 4.0 - 10.5 K/uL 11.4  13.8   17.2   Hemoglobin 13.0 - 17.0 g/dL 10.5  10.9  11.3   Hematocrit 39.0 - 52.0 % 31.0  31.8  32.5   Platelets 150 - 400 K/uL 85  87  82     Lab Results  Component Value Date   NA 130 (L) 02/09/2023   K 3.4 (L) 02/09/2023   CL 99 02/09/2023   CO2 25 02/09/2023      Assessment/Plan: Left> right leg cellulitis Significant improvement in erythema Leukocytosis has essentially resolved Continue Rocephin Cultures negative so far  Frequent falls Likely in the setting of EtOH/chronic debility/chronic ambulatory dysfunction (uses cane at baseline)/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   Will continue to counsel but do not think he has any intention of quitting at this point.    Hypokalemia/hypomagnesemia/hypophosphatemia In the setting of EtOH use Continue to replete.  Prolonged QTc Suspect due to electrolyte abnormalities Please repeat twelve-lead EKG on 3/6 with resolution of prolonged QTc.  Thrombocytopenia In the setting of EtOH use Mild-stable for periodic follow-up  Circumferential wall thickening of the distal half of the thoracic esophagus Incidental finding on CTA chest Continue PPI-although he is not overtly symptomatic. Will need outpatient follow-up with GI  Nodular opacity right upper lobe, pleural-based density left upper lobe Incidental finding on CT chest Radiology recommending repeat CT chest in 3 months  Bilateral pretibial lower extremity wounds Present prior to admission Wound care team following Continue dressing per wound care team  Homelessness Lives in van-found covered with feces Per ED documentation Social worker eval  BMI: Estimated body mass index is 27.09 kg/m as calculated from the following:   Height as of this encounter: '5\' 4"'$  (1.626 m).   Weight as  of this encounter: 71.6 kg.   Code status:   Code Status: Full Code   DVT Prophylaxis: SCDs Start: 02/07/23 2313 Place and maintain sequential compression device Start: 02/07/23 1345SCDs given thrombocytopenia   Family Communication: None at bedside   Disposition Plan: Status is: Observation The patient will require care spanning > 2 midnights and should be moved to inpatient because: Severity of illness   Planned Discharge Destination:Home versus SNF   Diet: Diet Order             Diet regular Room service appropriate? Yes with Assist; Fluid consistency: Thin  Diet effective now                     Antimicrobial agents: Anti-infectives (From admission, onward)    Start     Dose/Rate Route Frequency Ordered Stop   02/08/23 0000  vancomycin (VANCOCIN) IVPB 1000 mg/200 mL premix  Status:  Discontinued        1,000 mg 200 mL/hr over 60 Minutes Intravenous  Once 02/07/23 2312 02/07/23 2317   02/08/23 0000  ceFEPIme (MAXIPIME) 2 g in sodium chloride 0.9 % 100 mL IVPB  Status:  Discontinued        2 g 200 mL/hr over 30 Minutes Intravenous  Once 02/07/23 2312 02/07/23 2319   02/07/23 1600  vancomycin (VANCOREADY) IVPB 750 mg/150 mL  Status:  Discontinued       See Hyperspace for full Linked Orders Report.   750 mg 150 mL/hr over 60 Minutes Intravenous Every 12 hours 02/06/23 2113 02/07/23 0957   02/07/23 1600  cefTRIAXone (ROCEPHIN) 2 g in sodium chloride 0.9 % 100 mL IVPB        2 g 200 mL/hr over 30 Minutes Intravenous Every 24 hours 02/07/23 0957     02/07/23 0600  ceFEPIme (MAXIPIME) 2 g in sodium chloride 0.9 % 100 mL IVPB  Status:  Discontinued        2 g 200 mL/hr over 30 Minutes Intravenous Every 8 hours 02/06/23 2113 02/07/23 0957   02/06/23 2115  vancomycin (VANCOREADY) IVPB 1750 mg/350 mL       See Hyperspace for full Linked Orders Report.   1,750 mg 175 mL/hr over 120 Minutes Intravenous  Once 02/06/23 2113 02/07/23 0455   02/06/23 1615  cefTRIAXone  (ROCEPHIN) 2 g in sodium chloride 0.9 % 100 mL IVPB        2 g 200 mL/hr over 30 Minutes Intravenous STAT 02/06/23 1611 02/06/23 1824   02/06/23 1615  doxycycline (VIBRAMYCIN) 100 mg in sodium chloride 0.9 % 250 mL IVPB        100 mg 125 mL/hr over 120 Minutes Intravenous  Once 02/06/23 1611 02/06/23 1947        MEDICATIONS: Scheduled Meds:  Chlorhexidine Gluconate Cloth  6 each Topical 99991111   folic acid  1 mg Oral Daily   guaiFENesin  600 mg Oral BID   ipratropium-albuterol  3 mL Nebulization BID   LORazepam  0-4 mg Oral Q12H    morphine injection  1 mg Intravenous Once   multivitamin with minerals  1 tablet Oral Daily   mupirocin ointment  1 Application Nasal BID   nicotine  14 mg Transdermal Daily  nystatin   Topical TID   pantoprazole  40 mg Oral Q1200   thiamine  100 mg Oral Daily   Or   thiamine  100 mg Intravenous Daily   Continuous Infusions:  cefTRIAXone (ROCEPHIN)  IV 2 g (02/08/23 1651)   PRN Meds:.acetaminophen **OR** acetaminophen, albuterol, oxyCODONE-acetaminophen, Racepinephrine HCl   I have personally reviewed following labs and imaging studies  LABORATORY DATA: CBC: Recent Labs  Lab 02/06/23 0930 02/06/23 1333 02/06/23 2119 02/06/23 2142 02/07/23 0642 02/07/23 2357 02/08/23 0638  WBC 3.8*  --  19.5*  --  17.2* 13.8* 11.4*  HGB 12.6*   < > 12.0* 11.9* 11.3* 10.9* 10.5*  HCT 35.3*   < > 35.2* 35.0* 32.5* 31.8* 31.0*  MCV 100.3*  --  104.5*  --  102.5* 103.9* 105.4*  PLT 78*  --  83*  --  82* 87* 85*   < > = values in this interval not displayed.     Basic Metabolic Panel: Recent Labs  Lab 02/06/23 0930 02/06/23 1333 02/06/23 2119 02/06/23 2142 02/07/23 0642 02/07/23 2357 02/08/23 0638 02/09/23 0555  NA 131* 132* 133* 135 135  --  134* 130*  K 2.9* 3.0* 3.7 3.8 3.4*  --  3.2* 3.4*  CL 95* 95* 99  --  102  --  102 99  CO2 21*  --  22  --  27  --  21* 25  GLUCOSE 79 86 103*  --  94  --  97 93  BUN 7* 7* 9  --  8  --  10 5*   CREATININE 0.95 0.80 0.88  --  0.82  --  0.67 0.59*  CALCIUM 8.1*  --  8.0*  --  7.6*  --  7.7* 8.1*  MG 1.3*  --  2.0  --  2.2 2.1 2.0 1.4*  PHOS 1.7*  --   --   --  3.7 2.5 2.9 2.6     GFR: Estimated Creatinine Clearance: 86.9 mL/min (A) (by C-G formula based on SCr of 0.59 mg/dL (L)).  Liver Function Tests: Recent Labs  Lab 02/06/23 0930 02/06/23 2119 02/07/23 0642 02/08/23 0638  AST 81* 131* 117* 61*  ALT 29 45* 52* 41  ALKPHOS 151* 62 54 63  BILITOT 1.6* 0.7 0.8 0.6  PROT 6.3* 6.3* 5.9* 5.6*  ALBUMIN 3.0* 2.8* 2.5* 2.4*    Recent Labs  Lab 02/06/23 2119  LIPASE 29    Recent Labs  Lab 02/06/23 1952  AMMONIA 21     Coagulation Profile: Recent Labs  Lab 02/06/23 1952  INR 1.2     Cardiac Enzymes: Recent Labs  Lab 02/06/23 0930  CKTOTAL 216     BNP (last 3 results) No results for input(s): "PROBNP" in the last 8760 hours.  Lipid Profile: No results for input(s): "CHOL", "HDL", "LDLCALC", "TRIG", "CHOLHDL", "LDLDIRECT" in the last 72 hours.  Thyroid Function Tests: Recent Labs    02/06/23 2119  TSH 3.039     Anemia Panel: Recent Labs    02/06/23 2119  VITAMINB12 211  FOLATE 21.4  FERRITIN 581*  TIBC 231*  IRON 15*  RETICCTPCT 1.5     Urine analysis: No results found for: "COLORURINE", "APPEARANCEUR", "LABSPEC", "PHURINE", "GLUCOSEU", "HGBUR", "BILIRUBINUR", "KETONESUR", "PROTEINUR", "UROBILINOGEN", "NITRITE", "LEUKOCYTESUR"  Sepsis Labs: Lactic Acid, Venous    Component Value Date/Time   LATICACIDVEN 1.9 02/06/2023 2352    MICROBIOLOGY: Recent Results (from the past 240 hour(s))  Resp panel by RT-PCR (RSV, Flu A&B, Covid)  Anterior Nasal Swab     Status: None   Collection Time: 02/06/23  9:39 AM   Specimen: Anterior Nasal Swab  Result Value Ref Range Status   SARS Coronavirus 2 by RT PCR NEGATIVE NEGATIVE Final   Influenza A by PCR NEGATIVE NEGATIVE Final   Influenza B by PCR NEGATIVE NEGATIVE Final    Comment:  (NOTE) The Xpert Xpress SARS-CoV-2/FLU/RSV plus assay is intended as an aid in the diagnosis of influenza from Nasopharyngeal swab specimens and should not be used as a sole basis for treatment. Nasal washings and aspirates are unacceptable for Xpert Xpress SARS-CoV-2/FLU/RSV testing.  Fact Sheet for Patients: EntrepreneurPulse.com.au  Fact Sheet for Healthcare Providers: IncredibleEmployment.be  This test is not yet approved or cleared by the [place] FDA and has been authorized for detection and/or diagnosis of SARS-CoV-2 by FDA under an Emergency Use Authorization (EUA). This EUA will remain in effect (meaning this test can be used) for the duration of the COVID-19 declaration under Section 564(b)(1) of the Act, 21 U.S.C. section 360bbb-3(b)(1), unless the authorization is terminated or revoked.     Resp Syncytial Virus by PCR NEGATIVE NEGATIVE Final    Comment: (NOTE) Fact Sheet for Patients: EntrepreneurPulse.com.au  Fact Sheet for Healthcare Providers: IncredibleEmployment.be  This test is not yet approved or cleared by the [place] FDA and has been authorized for detection and/or diagnosis of SARS-CoV-2 by FDA under an Emergency Use Authorization (EUA). This EUA will remain in effect (meaning this test can be used) for the duration of the COVID-19 declaration under Section 564(b)(1) of the Act, 21 U.S.C. section 360bbb-3(b)(1), unless the authorization is terminated or revoked.  Performed at [facility], [place] [address] [place], [place] [address]   MRSA Next Gen by PCR, Nasal     Status: Abnormal   Collection Time: 02/06/23  9:06 PM   Specimen: Nasal Mucosa; Nasal Swab  Result Value Ref Range Status   MRSA by PCR Next Gen DETECTED (A) NOT DETECTED Final    Comment: RESULT CALLED TO, READ BACK BY AND VERIFIED WITH: K JOHNS,RN'@0232'$  02/08/23 [place] (NOTE) The GeneXpert MRSA Assay  (FDA approved for NASAL specimens only), is one component of a comprehensive MRSA colonization surveillance program. It is not intended to diagnose MRSA infection nor to guide or monitor treatment for MRSA infections. Test performance is not FDA approved in patients less than 48 years old. Performed at [facility], [place] [address], [place], [place] [address]   Culture, blood (x 2)     Status: None (Preliminary result)   Collection Time: 02/06/23  9:19 PM   Specimen: BLOOD  Result Value Ref Range Status   Specimen Description BLOOD RIGHT ANTECUBITAL  Final   Special Requests   Final    BOTTLES DRAWN AEROBIC AND ANAEROBIC Blood Culture adequate volume   Culture   Final    NO GROWTH 3 DAYS Performed at [facility], [place] [address] [place], [place] [address]    Report Status PENDING  Incomplete  Gastrointestinal Panel by PCR , Stool     Status: Abnormal   Collection Time: 02/06/23  9:26 PM   Specimen: Nasal Mucosa; Stool  Result Value Ref Range Status   Campylobacter species NOT DETECTED NOT DETECTED Final   Plesimonas shigelloides NOT DETECTED NOT DETECTED Final   Salmonella species NOT DETECTED NOT DETECTED Final   Yersinia enterocolitica NOT DETECTED NOT DETECTED Final   Vibrio species NOT DETECTED NOT DETECTED Final  Vibrio cholerae NOT DETECTED NOT DETECTED Final   Enteroaggregative E coli (EAEC) NOT DETECTED NOT DETECTED Final   Enteropathogenic E coli (EPEC) DETECTED (A) NOT DETECTED Final    Comment: RESULT CALLED TO, READ BACK BY AND VERIFIED WITH: JENN COOK AT 1844 ON 02/08/23 BY SS    Enterotoxigenic E coli (ETEC) NOT DETECTED NOT DETECTED Final   Shiga like toxin producing E coli (STEC) NOT DETECTED NOT DETECTED Final   Shigella/Enteroinvasive E coli (EIEC) NOT DETECTED NOT DETECTED Final   Cryptosporidium NOT DETECTED NOT DETECTED Final   Cyclospora cayetanensis NOT DETECTED NOT DETECTED Final   Entamoeba histolytica NOT DETECTED NOT DETECTED Final    Giardia lamblia NOT DETECTED NOT DETECTED Final   Adenovirus F40/41 NOT DETECTED NOT DETECTED Final   Astrovirus NOT DETECTED NOT DETECTED Final   Norovirus GI/GII NOT DETECTED NOT DETECTED Final   Rotavirus A NOT DETECTED NOT DETECTED Final   Sapovirus (I, II, IV, and V) NOT DETECTED NOT DETECTED Final    Comment: Performed at [facility], [address] [place], [place] [address]  Culture, blood (x 2)     Status: None (Preliminary result)   Collection Time: 02/06/23  9:37 PM   Specimen: BLOOD  Result Value Ref Range Status   Specimen Description BLOOD SITE NOT SPECIFIED  Final   Special Requests   Final    BOTTLES DRAWN AEROBIC AND ANAEROBIC Blood Culture adequate volume   Culture   Final    NO GROWTH 3 DAYS Performed at [facility], [place] [address] [place], [place] [address]    Report Status PENDING  Incomplete  Respiratory (~20 pathogens) panel by PCR     Status: None   Collection Time: 02/06/23  9:41 PM   Specimen: Nasopharyngeal Swab; Respiratory  Result Value Ref Range Status   Adenovirus NOT DETECTED NOT DETECTED Final   Coronavirus 229E NOT DETECTED NOT DETECTED Final    Comment: (NOTE) The Coronavirus on the Respiratory Panel, DOES NOT test for the novel  Coronavirus (2019 nCoV)    Coronavirus HKU1 NOT DETECTED NOT DETECTED Final   Coronavirus NL63 NOT DETECTED NOT DETECTED Final   Coronavirus OC43 NOT DETECTED NOT DETECTED Final   Metapneumovirus NOT DETECTED NOT DETECTED Final   Rhinovirus / Enterovirus NOT DETECTED NOT DETECTED Final   Influenza A NOT DETECTED NOT DETECTED Final   Influenza B NOT DETECTED NOT DETECTED Final   Parainfluenza Virus 1 NOT DETECTED NOT DETECTED Final   Parainfluenza Virus 2 NOT DETECTED NOT DETECTED Final   Parainfluenza Virus 3 NOT DETECTED NOT DETECTED Final   Parainfluenza Virus 4 NOT DETECTED NOT DETECTED Final   Respiratory Syncytial Virus NOT DETECTED NOT DETECTED Final   Bordetella pertussis NOT DETECTED NOT  DETECTED Final   Bordetella Parapertussis NOT DETECTED NOT DETECTED Final   Chlamydophila pneumoniae NOT DETECTED NOT DETECTED Final   Mycoplasma pneumoniae NOT DETECTED NOT DETECTED Final    Comment: Performed at [facility], [place]. [address] [place], [place] [address]    RADIOLOGY STUDIES/RESULTS: VAS [place] ABI WITH/WO TBI  Result Date: 02/07/2023  LOWER EXTREMITY DOPPLER STUDY Patient Name:  AWAIS MANZA  Date of Exam:   02/07/2023 Medical Rec #: [id number]           Accession #:    [id number] Date of Birth: 1961/10/30            Patient Gender: M Patient Age:   62 years Exam Location:  [facility] Procedure:      VAS [place] ABI WITH/WO TBI Referring Phys: ANASTASSIA DOUTOVA --------------------------------------------------------------------------------  Indications: Pain (BLE cellulitis) High Risk Factors: Current smoker.  Comparison Study: No previous exams Performing Technologist: Hill, Jody RVT, RDMS  Examination Guidelines: A complete evaluation includes at minimum, Doppler waveform signals and systolic blood pressure reading at the level of bilateral brachial, anterior tibial, and posterior tibial arteries, when vessel segments are accessible. Bilateral testing is considered an integral part of a complete examination. Photoelectric Plethysmograph (PPG) waveforms and toe systolic pressure readings are included as required and additional duplex testing as needed. Limited examinations for reoccurring indications may be performed as noted.  ABI Findings: +---------+------------------+-----+---------+--------+ Right    Rt Pressure (mmHg)IndexWaveform Comment  +---------+------------------+-----+---------+--------+ Brachial 103                    triphasic         +---------+------------------+-----+---------+--------+ PTA      115               1.12 triphasic         +---------+------------------+-----+---------+--------+ DP       104               1.01 triphasic          +---------+------------------+-----+---------+--------+ Great Toe78                0.76 Normal            +---------+------------------+-----+---------+--------+ +---------+------------------+-----+---------+-------+ Left     Lt Pressure (mmHg)IndexWaveform Comment +---------+------------------+-----+---------+-------+ Brachial 99                     triphasic        +---------+------------------+-----+---------+-------+ PTA      116               1.13 triphasic        +---------+------------------+-----+---------+-------+ DP       102               0.99 biphasic         +---------+------------------+-----+---------+-------+ Great Toe64                0.62 Normal           +---------+------------------+-----+---------+-------+  Summary: Right: Resting right ankle-brachial index is within normal range. The right toe-brachial index is normal. Left: Resting left ankle-brachial index is within normal range. The left toe-brachial index is mildly abnormal. *See table(s) above for measurements and observations.  Electronically signed by Harold Barban MD on 02/07/2023 at 10:09:29 PM.    Final      LOS: 2 days   Oren Binet, MD  [facility]    To contact the attending provider between 7A-7P or the covering provider during after hours 7P-7A, please log into the web site [email or web address] and access using universal [place] password for that web site. If you do not have the password, please call the hospital operator.  02/09/2023, 11:12 AM

## 2023-02-09 NOTE — Progress Notes (Signed)
Physical Therapy Treatment Patient Details Name: Joseph Bell MRN: [id number] DOB: Oct 13, 1961 Today's Date: 02/09/2023   History of Present Illness Pt is a 62 y.o. male arriving via EMS after being found down and covered with stool in his [name]. Found to have SOB on exertion, RLS rhonchi bilaterally, and BLE cellulitis. CT head with no acute findings; CXR with diffuse interstitial prominence (pulmonary edema vs viral infection). PMH significant for alcohol abuse, homelessness, and subdural hematoma in Dec 2023.    PT Comments    Pt tolerated today's session well, noted improvement from previous session, able to progress to ambulation today with minA and RW. Pt cued for hand placement prior to standing as well as proximity to RW with ambulation, cueing with cues but intermittently required throughout session. Pt reports feeling better today but reports still below his mobility baseline, will continue to recommend SNF at discharge. Acute PT will follow during admission as appropriate to progress mobility.    Recommendations for follow up therapy are one component of a multi-disciplinary discharge planning process, led by the attending physician.  Recommendations may be updated based on patient status, additional functional criteria and insurance authorization.  Follow Up Recommendations  Skilled nursing-short term rehab (<3 hours/day) Can patient physically be transported by private vehicle: No   Assistance Recommended at Discharge Frequent or constant Supervision/Assistance  Patient can return home with the following A lot of help with walking and/or transfers;Assist for transportation;Help with stairs or ramp for entrance   Equipment Recommendations  None recommended by PT    Recommendations for Other Services       Precautions / Restrictions Precautions Precautions: Fall Restrictions Weight Bearing Restrictions: No     Mobility  Bed Mobility Overal bed mobility: Needs  Assistance Bed Mobility: Supine to Sit, Sit to Supine     Supine to sit: Min assist, HOB elevated Sit to supine: Min assist, HOB elevated   General bed mobility comments: use of bed rails, support for trunk on supine?sit, assist for BLE for return to supine    Transfers Overall transfer level: Needs assistance Equipment used: Rolling walker (2 wheels) Transfers: Sit to/from Stand Sit to Stand: Min assist           General transfer comment: minA for power up and steady    Ambulation/Gait Ambulation/Gait assistance: Min assist Gait Distance (Feet): 75 Feet Assistive device: Rolling walker (2 wheels) Gait Pattern/deviations: Step-through pattern, Decreased stride length, Trunk flexed, Drifts right/left, Wide base of support Gait velocity: decreased     General Gait Details: cued for proximity to RW and forward gaze, mild path deviation noted, minA for balance and safety   Stairs             Wheelchair Mobility    Modified Rankin (Stroke Patients Only)       Balance Overall balance assessment: Needs assistance Sitting-balance support: No upper extremity supported, Feet supported Sitting balance-Leahy Scale: Fair Sitting balance - Comments: stable sitting balance   Standing balance support: Bilateral upper extremity supported, During functional activity Standing balance-Leahy Scale: Poor Standing balance comment: reliant on external support for balance                            Cognition Arousal/Alertness: Awake/alert Behavior During Therapy: WFL for tasks assessed/performed Overall Cognitive Status: No family/caregiver present to determine baseline cognitive functioning Area of Impairment: Following commands, Safety/judgement, Problem solving  Following Commands: Follows one step commands with increased time, Follows one step commands consistently Safety/Judgement: Decreased awareness of safety, Decreased  awareness of deficits   Problem Solving: Slow processing, Requires verbal cues General Comments: pt with improvement noted in cognition, pleasant throughout and better attention to task, continuing to require cues for safety        Exercises      General Comments General comments (skin integrity, edema, etc.): VSS on room air      Pertinent Vitals/Pain Pain Assessment Pain Assessment: Faces Faces Pain Scale: Hurts little more Pain Location: BLE Pain Descriptors / Indicators: Aching Pain Intervention(s): Monitored during session, Limited activity within patient's tolerance    Home Living                          Prior Function            PT Goals (current goals can now be found in the care plan section) Acute Rehab PT Goals Patient Stated Goal: get better PT Goal Formulation: With patient Time For Goal Achievement: 02/21/23 Potential to Achieve Goals: Good Progress towards PT goals: Progressing toward goals    Frequency    Min 2X/week      PT Plan Current plan remains appropriate    Co-evaluation              AM-PAC PT "6 Clicks" Mobility   Outcome Measure  Help needed turning from your back to your side while in a flat bed without using bedrails?: A Little Help needed moving from lying on your back to sitting on the side of a flat bed without using bedrails?: A Little Help needed moving to and from a bed to a chair (including a wheelchair)?: A Little Help needed standing up from a chair using your arms (e.g., wheelchair or bedside chair)?: A Little Help needed to walk in hospital room?: A Little Help needed climbing 3-5 steps with a railing? : Total 6 Click Score: 16    End of Session Equipment Utilized During Treatment: Gait belt Activity Tolerance: Patient tolerated treatment well Patient left: in bed;with call bell/phone within reach;with bed alarm set Nurse Communication: Mobility status PT Visit Diagnosis: Repeated falls  (R29.6);Muscle weakness (generalized) (M62.81);Difficulty in walking, not elsewhere classified (R26.2)     Time: [id number] PT Time Calculation (min) (ACUTE ONLY): 23 min  Charges:  $Gait Training: 8-22 mins $Therapeutic Activity: 8-22 mins                     [name], PT DPT Acute Rehabilitation Services Office [phone]    Luvenia Heller 02/09/2023, 2:55 PM

## 2023-02-10 LAB — BASIC METABOLIC PANEL
Anion gap: 9 (ref 5–15)
BUN: <5 mg/dL — ABNORMAL LOW (ref 8–23)
CO2: 23 mmol/L (ref 22–32)
Calcium: 8.6 mg/dL — ABNORMAL LOW (ref 8.9–10.3)
Chloride: 99 mmol/L (ref 98–111)
Creatinine, Ser: 0.59 mg/dL — ABNORMAL LOW (ref 0.61–1.24)
GFR, Estimated: >60 mL/min (ref >60)
Glucose, Bld: 94 mg/dL (ref 70–99)
Potassium: 3.5 mmol/L (ref 3.5–5.1)
Sodium: 131 mmol/L — ABNORMAL LOW (ref 135–145)

## 2023-02-10 LAB — MAGNESIUM: Magnesium: 1.5 mg/dL — ABNORMAL LOW (ref 1.7–2.4)

## 2023-02-10 MED ORDER — MAGNESIUM SULFATE 4 GM/100ML IV SOLN
4.0000 g | Freq: Once | INTRAVENOUS | Status: AC
  Administered 2023-02-10: 4 g via INTRAVENOUS
  Filled 2023-02-10: qty 100

## 2023-02-10 MED ORDER — POTASSIUM CHLORIDE CRYS ER 20 MEQ PO TBCR
20.0000 meq | EXTENDED_RELEASE_TABLET | Freq: Once | ORAL | Status: AC
  Administered 2023-02-10: 20 meq via ORAL
  Filled 2023-02-10: qty 1

## 2023-02-10 MED ORDER — DOXYCYCLINE HYCLATE 100 MG PO TABS
100.0000 mg | ORAL_TABLET | Freq: Two times a day (BID) | ORAL | Status: AC
  Administered 2023-02-10 – 2023-02-12 (×6): 100 mg via ORAL
  Filled 2023-02-10 (×6): qty 1

## 2023-02-10 NOTE — Progress Notes (Addendum)
PROGRESS NOTE        PATIENT DETAILS Name: Joseph Bell Age: 62 y.o. Sex: male Date of Birth: [birth year] Admit Date: 02/06/2023 Admitting Physician Evalee Mutton Kristeen Mans, MD [id number], No Pcp Per  Brief Summary: Patient is a 62 y.o.  male with history of EtOH use, homelessness-presented to the ED with frequent falls, weakness-was found to have EtOH withdrawal, left> right leg cellulitis and numerous electrolyte abnormalities.  [facility].  Significant events: 3/4>> admit to [facility]  Significant studies: 3/4>> CT angio chest: No PE, circumferential wall thickening of distal half of thoracic esophagus. 3/4>> CT abdomen/pelvis: No acute pathology. 3/4>> CT C-spine: No acute findings. 3/4>> x-ray right knee: No fracture 3/4>> x-ray right fibula/tibia: No fracture 3/4>> x-ray left fibula/tibia: No fracture 3/5>> MRI brain: No acute intracranial process  Significant microbiology data: 3/4>> COVID/influenza/RSV PCR: Negative 3/4>> respiratory virus panel: Negative 3/4>> blood culture: Negative  Procedures: None  Consults: None  Subjective: Awake/alert-hardly any LLE erythema evident on my exam.  Claims lower extremity weakness is getting better  Objective: Vitals: Blood pressure 107/68, pulse 69, temperature 98 F (36.7 C), temperature source Oral, resp. rate 15, height '5\' 4"'$  ([id number] m), weight 71.6 kg, SpO2 94 %.   Exam: Gen Exam:Alert awake-not in any distress HEENT:atraumatic, normocephalic Chest: B/L clear to auscultation anteriorly CVS:S1S2 regular Abdomen:soft non tender, non distended Extremities:no edema Neurology: Non focal Skin: no rash  Pertinent Labs/Radiology:    Latest Ref Rng & Units 02/08/2023    6:38 AM 02/07/2023   11:57 PM 02/07/2023    6:42 AM  CBC  WBC 4.0 - 10.5 K/uL 11.4  13.8  17.2   Hemoglobin 13.0 - 17.0 g/dL 10.5  10.9  11.3   Hematocrit 39.0 - 52.0 % 31.0  31.8  32.5    Platelets 150 - 400 K/uL 85  87  82     Lab Results  Component Value Date   NA 131 (L) 02/10/2023   K 3.5 02/10/2023   CL 99 02/10/2023   CO2 23 02/10/2023      Assessment/Plan: Left> right leg cellulitis Significant improvement in erythema Leukocytosis has essentially resolved Stop Rocephin-switch to oral doxycycline for a few more days. 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 Per SW-he will he hard to place due to lack of insurance  EtOH withdrawal No tremors Completely awake/alert today Ativan per CIWA protocol.   Will continue to counsel but do not think he has any intention of quitting at this point.    Hypokalemia/hypomagnesemia/hypophosphatemia In the setting of EtOH use Continue to replete.  Prolonged QTc Suspect due to electrolyte abnormalities Repeat twelve-lead EKG on 3/6 with resolution of prolonged QTc.  Thrombocytopenia In the setting of EtOH use Mild-stable for periodic follow-up  Circumferential wall thickening of the distal half of the thoracic esophagus Incidental finding on CTA chest Continue PPI-although he is not overtly symptomatic. Will need outpatient follow-up with GI  Nodular opacity right upper lobe, pleural-based density left upper lobe Incidental finding on CT  chest Radiology recommending repeat CT chest in 3 months  Bilateral pretibial lower extremity wounds Present prior to admission Wound care team following Continue dressing per wound care team  Homelessness Lives in van-found covered with feces Per ED  documentation Social worker eval  BMI: Estimated body mass index is 27.09 kg/m as calculated from the following:   Height as of this encounter: '5\' 4"'$  (1.626 m).   Weight as of this encounter: 71.6 kg.   Code status:   Code Status: Full Code   DVT Prophylaxis: SCDs Start: 02/07/23 2313 Place and maintain sequential compression device Start: 02/07/23 1345SCDs given thrombocytopenia   Family Communication: None at bedside   Disposition Plan: Status is: Observation The patient will require care spanning > 2 midnights and should be moved to inpatient because: Severity of illness   Planned Discharge Destination:Home versus SNF   Diet: Diet Order             Diet regular Room service appropriate? Yes with Assist; Fluid consistency: Thin  Diet effective now                     Antimicrobial agents: Anti-infectives (From admission, onward)    Start     Dose/Rate Route Frequency Ordered Stop   02/08/23 0000  vancomycin (VANCOCIN) IVPB 1000 mg/200 mL premix  Status:  Discontinued        1,000 mg 200 mL/hr over 60 Minutes Intravenous  Once 02/07/23 2312 02/07/23 2317   02/08/23 0000  ceFEPIme (MAXIPIME) 2 g in sodium chloride 0.9 % 100 mL IVPB  Status:  Discontinued        2 g 200 mL/hr over 30 Minutes Intravenous  Once 02/07/23 2312 02/07/23 2319   02/07/23 1600  vancomycin (VANCOREADY) IVPB 750 mg/150 mL  Status:  Discontinued       See Hyperspace for full Linked Orders Report.   750 mg 150 mL/hr over 60 Minutes Intravenous Every 12 hours 02/06/23 2113 02/07/23 0957   02/07/23 1600  cefTRIAXone (ROCEPHIN) 2 g in sodium chloride 0.9 % 100 mL IVPB        2 g 200 mL/hr over 30 Minutes Intravenous Every 24 hours 02/07/23 0957     02/07/23 0600  ceFEPIme (MAXIPIME) 2 g in sodium chloride 0.9 % 100 mL IVPB  Status:  Discontinued        2 g 200 mL/hr over 30 Minutes Intravenous Every 8 hours 02/06/23 2113 02/07/23 0957   02/06/23 2115  vancomycin (VANCOREADY) IVPB 1750  mg/350 mL       See Hyperspace for full Linked Orders Report.   1,750 mg 175 mL/hr over 120 Minutes Intravenous  Once 02/06/23 2113 02/07/23 0455   02/06/23 1615  cefTRIAXone (ROCEPHIN) 2 g in sodium chloride 0.9 % 100 mL IVPB        2 g 200 mL/hr over 30 Minutes Intravenous STAT 02/06/23 1611 02/06/23 1824   02/06/23 1615  doxycycline (VIBRAMYCIN) 100 mg in sodium chloride 0.9 % 250 mL IVPB        100 mg 125 mL/hr over 120 Minutes Intravenous  Once 02/06/23 1611 02/06/23 1947        MEDICATIONS: Scheduled Meds:  Chlorhexidine Gluconate Cloth  6 each Topical 99991111   folic acid  1 mg Oral Daily   guaiFENesin  600 mg Oral BID   LORazepam  0-4 mg Oral Q12H    morphine injection  1 mg Intravenous Once   multivitamin with minerals  1 tablet Oral Daily   mupirocin ointment  1 Application Nasal BID   nicotine  14 mg Transdermal Daily   nystatin   Topical TID   pantoprazole  40 mg Oral Q1200   thiamine  100 mg Oral Daily   Or   thiamine  100 mg Intravenous Daily   Continuous Infusions:  cefTRIAXone (ROCEPHIN)  IV 2 g (02/09/23 1641)   PRN Meds:.acetaminophen **OR** acetaminophen, albuterol, oxyCODONE-acetaminophen, Racepinephrine HCl   I have personally reviewed following labs and imaging studies  LABORATORY DATA: CBC: Recent Labs  Lab 02/06/23 0930 02/06/23 1333 02/06/23 2119 02/06/23 2142 02/07/23 0642 02/07/23 2357 02/08/23 0638  WBC 3.8*  --  19.5*  --  17.2* 13.8* 11.4*  HGB 12.6*   < > 12.0* 11.9* 11.3* 10.9* 10.5*  HCT 35.3*   < > 35.2* 35.0* 32.5* 31.8* 31.0*  MCV 100.3*  --  104.5*  --  102.5* 103.9* 105.4*  PLT 78*  --  83*  --  82* 87* 85*   < > = values in this interval not displayed.     Basic Metabolic Panel: Recent Labs  Lab 02/06/23 0930 02/06/23 1333 02/06/23 2119 02/06/23 2142 02/07/23 [id number] 02/07/23 2357 02/08/23 0638 02/09/23 0555 02/10/23 0835  NA 131*   < > 133* 135 135  --  134* 130* 131*  K 2.9*   < > 3.7 3.8 3.4*  --  3.2* 3.4*  3.5  CL 95*   < > 99  --  102  --  102 99 99  CO2 21*  --  22  --  27  --  21* 25 23  GLUCOSE 79   < > 103*  --  94  --  97 93 94  BUN 7*   < > 9  --  8  --  10 5* <5*  CREATININE 0.95   < > 0.88  --  0.82  --  0.67 0.59* 0.59*  CALCIUM 8.1*  --  8.0*  --  7.6*  --  7.7* 8.1* 8.6*  MG 1.3*  --  2.0  --  2.2 2.1 2.0 1.4* 1.5*  PHOS 1.7*  --   --   --  3.7 2.5 2.9 2.6  --    < > = values in this interval not displayed.     GFR: Estimated Creatinine Clearance: 86.9 mL/min (A) (by C-G formula based on SCr of 0.59 mg/dL (L)).  Liver Function Tests: Recent Labs  Lab 02/06/23 0930 02/06/23 2119 02/07/23 0642 02/08/23 0638  AST 81* 131* 117* 61*  ALT 29 45* 52* 41  ALKPHOS 151* 62 54 63  BILITOT 1.6* 0.7 0.8 0.6  PROT 6.3* 6.3* 5.9* 5.6*  ALBUMIN 3.0* 2.8* 2.5* 2.4*    Recent Labs  Lab 02/06/23 2119  LIPASE 29    Recent Labs  Lab 02/06/23 1952  AMMONIA 21     Coagulation Profile: Recent Labs  Lab 02/06/23 1952  INR 1.2     Cardiac Enzymes: Recent Labs  Lab 02/06/23 0930  CKTOTAL 216     BNP (last 3 results) No results for input(s): "PROBNP" in the last 8760 hours.  Lipid Profile: No results for input(s): "CHOL", "HDL", "LDLCALC", "TRIG", "CHOLHDL", "LDLDIRECT" in the last 72 hours.  Thyroid Function Tests: No results for input(s): "TSH", "T4TOTAL", "FREET4", "T3FREE", "THYROIDAB" in the last 72 hours.   Anemia Panel: No results for input(s): "VITAMINB12", "FOLATE", "FERRITIN", "TIBC", "IRON", "RETICCTPCT" in the last 72 hours.  Urine analysis: No results found for: "COLORURINE", "APPEARANCEUR", "LABSPEC", "PHURINE", "GLUCOSEU", "HGBUR", "BILIRUBINUR", "KETONESUR", "PROTEINUR", "UROBILINOGEN", "NITRITE", "LEUKOCYTESUR"  Sepsis Labs: Lactic Acid, Venous    Component Value Date/Time   LATICACIDVEN 1.9 02/06/2023 2352    MICROBIOLOGY: Recent Results (from the past 240 hour(s))  Resp panel by RT-PCR (RSV, Flu A&B, Covid) Anterior Nasal Swab      Status: None   Collection Time: 02/06/23  9:39 AM   Specimen: Anterior Nasal Swab  Result Value Ref Range Status   SARS Coronavirus 2 by RT PCR NEGATIVE NEGATIVE Final   Influenza A by PCR NEGATIVE NEGATIVE Final   Influenza B by PCR NEGATIVE NEGATIVE Final    Comment: (NOTE) The Xpert Xpress SARS-CoV-2/FLU/RSV plus assay is intended as an aid in the diagnosis of influenza from Nasopharyngeal swab specimens and should not be used as a sole basis for treatment. Nasal washings and aspirates are unacceptable for Xpert Xpress SARS-CoV-2/FLU/RSV testing.  Fact Sheet for Patients: EntrepreneurPulse.com.au  Fact Sheet for Healthcare Providers: IncredibleEmployment.be  This test is not yet approved or cleared by the [place] FDA and has been authorized for detection and/or diagnosis of SARS-CoV-2 by FDA under an Emergency Use Authorization (EUA). This EUA will remain in effect (meaning this test can be used) for the duration of the COVID-19 declaration under Section 564(b)(1) of the Act, 21 U.S.C. section 360bbb-3(b)(1), unless the authorization is terminated or revoked.     Resp Syncytial Virus by PCR NEGATIVE NEGATIVE Final    Comment: (NOTE) Fact Sheet for Patients: EntrepreneurPulse.com.au  Fact Sheet for Healthcare Providers: IncredibleEmployment.be  This test is not yet approved or cleared by the [place] FDA and has been authorized for detection and/or diagnosis of SARS-CoV-2 by FDA under an Emergency Use Authorization (EUA). This EUA will remain in effect (meaning this test can be used) for the duration of the COVID-19 declaration under Section 564(b)(1) of the Act, 21 U.S.C. section 360bbb-3(b)(1), unless the authorization is terminated or revoked.  Performed at [facility], [place] [address], [place], [place] [address]   MRSA Next Gen by PCR, Nasal     Status: Abnormal   Collection  Time: 02/06/23  9:06 PM   Specimen: Nasal Mucosa; Nasal Swab  Result Value Ref Range Status   MRSA by PCR Next Gen DETECTED (A) NOT DETECTED Final    Comment: RESULT CALLED TO, READ BACK BY AND VERIFIED WITH: K JOHNS,RN'@0232'$  02/08/23 [place] (NOTE) The GeneXpert MRSA Assay (FDA approved for NASAL specimens only), is one component of a comprehensive MRSA colonization surveillance program. It is not intended to diagnose MRSA infection nor to guide or monitor treatment for MRSA infections. Test performance is not FDA approved in patients less than 59 years old. Performed at [facility], [place] [address], [place], [place] [address]   Culture, blood (x 2)     Status: None (Preliminary result)   Collection Time: 02/06/23  9:19 PM   Specimen: BLOOD  Result Value Ref Range Status   Specimen Description BLOOD RIGHT ANTECUBITAL  Final   Special Requests   Final    BOTTLES DRAWN AEROBIC AND ANAEROBIC Blood Culture adequate volume   Culture   Final    NO GROWTH 4 DAYS Performed at [facility], [place] [address], [place], [place] [address]    Report Status PENDING  Incomplete  Gastrointestinal Panel by PCR , Stool     Status: Abnormal   Collection Time: 02/06/23  9:26 PM  Specimen: Nasal Mucosa; Stool  Result Value Ref Range Status   Campylobacter species NOT DETECTED NOT DETECTED Final   Plesimonas shigelloides NOT DETECTED NOT DETECTED Final   Salmonella species NOT DETECTED NOT DETECTED Final   Yersinia enterocolitica NOT DETECTED NOT DETECTED Final   Vibrio species NOT DETECTED NOT DETECTED Final   Vibrio cholerae NOT DETECTED NOT DETECTED Final   Enteroaggregative E coli (EAEC) NOT DETECTED NOT DETECTED Final   Enteropathogenic E coli (EPEC) DETECTED (A) NOT DETECTED Final    Comment: RESULT CALLED TO, READ BACK BY AND VERIFIED WITH: JENN COOK AT 1844 ON 02/08/23 BY SS    Enterotoxigenic E coli (ETEC) NOT DETECTED NOT DETECTED Final   Shiga like toxin producing E coli (STEC) NOT  DETECTED NOT DETECTED Final   Shigella/Enteroinvasive E coli (EIEC) NOT DETECTED NOT DETECTED Final   Cryptosporidium NOT DETECTED NOT DETECTED Final   Cyclospora cayetanensis NOT DETECTED NOT DETECTED Final   Entamoeba histolytica NOT DETECTED NOT DETECTED Final   Giardia lamblia NOT DETECTED NOT DETECTED Final   Adenovirus F40/41 NOT DETECTED NOT DETECTED Final   Astrovirus NOT DETECTED NOT DETECTED Final   Norovirus GI/GII NOT DETECTED NOT DETECTED Final   Rotavirus A NOT DETECTED NOT DETECTED Final   Sapovirus (I, II, IV, and V) NOT DETECTED NOT DETECTED Final    Comment: Performed at [facility], [place] [place], [place] [address]  Culture, blood (x 2)     Status: None (Preliminary result)   Collection Time: 02/06/23  9:37 PM   Specimen: BLOOD  Result Value Ref Range Status   Specimen Description BLOOD SITE NOT SPECIFIED  Final   Special Requests   Final    BOTTLES DRAWN AEROBIC AND ANAEROBIC Blood Culture adequate volume   Culture   Final    NO GROWTH 4 DAYS Performed at [facility], [place] [address], [place], [place] [address]    Report Status PENDING  Incomplete  Respiratory (~20 pathogens) panel by PCR     Status: None   Collection Time: 02/06/23  9:41 PM   Specimen: Nasopharyngeal Swab; Respiratory  Result Value Ref Range Status   Adenovirus NOT DETECTED NOT DETECTED Final   Coronavirus 229E NOT DETECTED NOT DETECTED Final    Comment: (NOTE) The Coronavirus on the Respiratory Panel, DOES NOT test for the novel  Coronavirus (2019 nCoV)    Coronavirus HKU1 NOT DETECTED NOT DETECTED Final   Coronavirus NL63 NOT DETECTED NOT DETECTED Final   Coronavirus OC43 NOT DETECTED NOT DETECTED Final   Metapneumovirus NOT DETECTED NOT DETECTED Final   Rhinovirus / Enterovirus NOT DETECTED NOT DETECTED Final   Influenza A NOT DETECTED NOT DETECTED Final   Influenza B NOT DETECTED NOT DETECTED Final   Parainfluenza Virus 1 NOT DETECTED NOT DETECTED Final    Parainfluenza Virus 2 NOT DETECTED NOT DETECTED Final   Parainfluenza Virus 3 NOT DETECTED NOT DETECTED Final   Parainfluenza Virus 4 NOT DETECTED NOT DETECTED Final   Respiratory Syncytial Virus NOT DETECTED NOT DETECTED Final   Bordetella pertussis NOT DETECTED NOT DETECTED Final   Bordetella Parapertussis NOT DETECTED NOT DETECTED Final   Chlamydophila pneumoniae NOT DETECTED NOT DETECTED Final   Mycoplasma pneumoniae NOT DETECTED NOT DETECTED Final    Comment: Performed at [facility], [place]. [address], [place], [place] [address]    RADIOLOGY STUDIES/RESULTS: No results found.   LOS: 3 days   Oren Binet, MD  [facility]  To contact the attending provider between 7A-7P or the covering provider during after hours 7P-7A, please log into the web site [email or web address] and access using universal [place] password for that web site. If you do not have the password, please call the hospital operator.  02/10/2023, 10:59 AM

## 2023-02-10 NOTE — Progress Notes (Signed)
Occupational Therapy Treatment Patient Details Name: Joseph Bell MRN: [id number] DOB: 1961/12/02 Today's Date: 02/10/2023   History of present illness Pt is a 62 y.o. male arriving via EMS after being found down and covered with stool in his [name]. Found to have SOB on exertion, RLS rhonchi bilaterally, and BLE cellulitis. CT head with no acute findings; CXR with diffuse interstitial prominence (pulmonary edema vs viral infection). PMH significant for alcohol abuse, homelessness, and subdural hematoma in Dec 2023.   OT comments  Pt sleepy, but needing to use bathroom. Min assist for all mobility. Total assist for pericare. Pt stood and washed his hands at sink with min guard assist. Pt declined remaining up to chair to eat, preferring to go back to sleep. VSS on RA. Pt demonstrating improved mobility and cognition, but remains impaired.    Recommendations for follow up therapy are one component of a multi-disciplinary discharge planning process, led by the attending physician.  Recommendations may be updated based on patient status, additional functional criteria and insurance authorization.    Follow Up Recommendations  Skilled nursing-short term rehab (<3 hours/day)     Assistance Recommended at Discharge Frequent or constant Supervision/Assistance  Patient can return home with the following  A lot of help with bathing/dressing/bathroom;Help with stairs or ramp for entrance;Assist for transportation;Direct supervision/assist for financial management;Direct supervision/assist for medications management;Assistance with cooking/housework;A little help with walking and/or transfers   Equipment Recommendations   (RW)    Recommendations for Other Services      Precautions / Restrictions Precautions Precautions: Fall Restrictions Weight Bearing Restrictions: No       Mobility Bed Mobility Overal bed mobility: Needs Assistance Bed Mobility: Sidelying to Sit, Sit to Sidelying    Sidelying to sit: Min assist     Sit to sidelying: Min assist General bed mobility comments: use of rail, assist to raise trunk and for LEs back into bed    Transfers Overall transfer level: Needs assistance Equipment used: Rolling walker (2 wheels) Transfers: Sit to/from Stand Sit to Stand: Min assist           General transfer comment: assist to rise and steady, cues for hand placement     Balance Overall balance assessment: Needs assistance   Sitting balance-Leahy Scale: Fair     Standing balance support: Bilateral upper extremity supported, During functional activity Standing balance-Leahy Scale: Poor Standing balance comment: fair static balance at sink                           ADL either performed or assessed with clinical judgement   ADL Overall ADL's : Needs assistance/impaired     Grooming: Wash/dry hands;Standing;Min guard                   Toilet Transfer: Minimal assistance;Ambulation   Toileting- Clothing Manipulation and Hygiene: Total assistance;Sit to/from stand       Functional mobility during ADLs: Minimal assistance;Rolling walker (2 wheels)      Extremity/Trunk Assessment              Vision       Perception     Praxis      Cognition Arousal/Alertness: Awake/alert Behavior During Therapy: Flat affect Overall Cognitive Status: No family/caregiver present to determine baseline cognitive functioning Area of Impairment: Following commands, Safety/judgement, Problem solving, Orientation, Memory                 Orientation Level: Time  Memory: Decreased short-term memory Following Commands: Follows one step commands with increased time, Follows one step commands consistently Safety/Judgement: Decreased awareness of safety, Decreased awareness of deficits   Problem Solving: Slow processing, Requires verbal cues General Comments: pt reports being sleepy with interrupted sleep due to blood draw         Exercises      Shoulder Instructions       General Comments      Pertinent Vitals/ Pain       Pain Assessment Pain Assessment: Faces Faces Pain Scale: Hurts little more Pain Location: BLE Pain Descriptors / Indicators: Aching Pain Intervention(s): Monitored during session, Repositioned  Home Living                                          Prior Functioning/Environment              Frequency  Min 2X/week        Progress Toward Goals  OT Goals(current goals can now be found in the care plan section)  Progress towards OT goals: Progressing toward goals  Acute Rehab OT Goals OT Goal Formulation: With patient Time For Goal Achievement: 02/21/23 Potential to Achieve Goals: [place] Discharge plan remains appropriate    Co-evaluation                 AM-PAC OT "6 Clicks" Daily Activity     Outcome Measure   Help from another person eating meals?: None Help from another person taking care of personal grooming?: A Little Help from another person toileting, which includes using toliet, bedpan, or urinal?: Total Help from another person bathing (including washing, rinsing, drying)?: A Lot Help from another person to put on and taking off regular upper body clothing?: A Little Help from another person to put on and taking off regular lower body clothing?: A Lot 6 Click Score: 15    End of Session Equipment Utilized During Treatment: Gait belt;Rolling walker (2 wheels)  OT Visit Diagnosis: Unsteadiness on feet (R26.81);Muscle weakness (generalized) (M62.81);Repeated falls (R29.6);History of falling (Z91.81);Other symptoms and signs involving cognitive function   Activity Tolerance Patient limited by fatigue   Patient Left in bed;with call bell/phone within reach;with bed alarm set   Nurse Communication          Time: [phone] OT Time Calculation (min): 16 min  Charges: OT General Charges $OT Visit: 1 Visit OT  Treatments $Self Care/Home Management : 8-22 mins  Joseph Bell, OTR/L Acute Rehabilitation Services Office: [phone]  Joseph Bell 02/10/2023, 9:05 AM

## 2023-02-11 LAB — CULTURE, BLOOD (ROUTINE X 2)
Culture: NO GROWTH
Culture: NO GROWTH
Special Requests: ADEQUATE
Special Requests: ADEQUATE

## 2023-02-11 LAB — BASIC METABOLIC PANEL
Anion gap: 9 (ref 5–15)
BUN: <5 mg/dL — ABNORMAL LOW (ref 8–23)
CO2: 21 mmol/L — ABNORMAL LOW (ref 22–32)
Calcium: 8.8 mg/dL — ABNORMAL LOW (ref 8.9–10.3)
Chloride: 99 mmol/L (ref 98–111)
Creatinine, Ser: 0.66 mg/dL (ref 0.61–1.24)
GFR, Estimated: >60 mL/min (ref >60)
Glucose, Bld: 96 mg/dL (ref 70–99)
Potassium: 3.8 mmol/L (ref 3.5–5.1)
Sodium: 129 mmol/L — ABNORMAL LOW (ref 135–145)

## 2023-02-11 LAB — SODIUM, URINE, RANDOM
Sodium, Ur: 81 mmol/L
Sodium, Ur: 39 mmol/L

## 2023-02-11 LAB — OSMOLALITY, URINE: Osmolality, Ur: 301 mOsm/kg (ref 300–900)

## 2023-02-11 LAB — MAGNESIUM: Magnesium: 1.5 mg/dL — ABNORMAL LOW (ref 1.7–2.4)

## 2023-02-11 LAB — CREATININE, URINE, RANDOM: Creatinine, Urine: 63 mg/dL

## 2023-02-11 MED ORDER — MAGNESIUM SULFATE 4 GM/100ML IV SOLN
4.0000 g | Freq: Once | INTRAVENOUS | Status: AC
  Administered 2023-02-11: 4 g via INTRAVENOUS
  Filled 2023-02-11: qty 100

## 2023-02-11 MED ORDER — ORAL CARE MOUTH RINSE: 15.0000 mL | OROMUCOSAL | Status: DC | PRN

## 2023-02-11 MED ORDER — MAGNESIUM OXIDE -MG SUPPLEMENT 400 (240 MG) MG PO TABS
400.0000 mg | ORAL_TABLET | Freq: Two times a day (BID) | ORAL | Status: AC
  Administered 2023-02-11 – 2023-02-12 (×4): 400 mg via ORAL
  Filled 2023-02-11 (×4): qty 1

## 2023-02-11 NOTE — Progress Notes (Addendum)
PROGRESS NOTE        PATIENT DETAILS Name: Joseph Bell Age: 62 y.o. Sex: male Date of Birth: 1961-01-29 Admit Date: 02/06/2023 Admitting Physician Evalee Mutton Kristeen Mans, MD [id number], No Pcp Per  Brief Summary: Patient is a 62 y.o.  male with history of EtOH use, homelessness-presented to the ED with frequent falls, weakness-was found to have EtOH withdrawal, left> right leg cellulitis and numerous electrolyte abnormalities.  He was subsequently [facility].  Significant events: 3/4>> admit to [facility]  Significant studies: 3/4>> CT angio chest: No PE, circumferential wall thickening of distal half of thoracic esophagus. 3/4>> CT abdomen/pelvis: No acute pathology. 3/4>> CT C-spine: No acute findings. 3/4>> x-ray right knee: No fracture 3/4>> x-ray right fibula/tibia: No fracture 3/4>> x-ray left fibula/tibia: No fracture 3/5>> MRI brain: No acute intracranial process  Significant microbiology data: 3/4>> COVID/influenza/RSV PCR: Negative 3/4>> respiratory virus panel: Negative 3/4>> blood culture: Negative  Procedures: None  Consults: None  Subjective: No major events overnight.  Objective: Vitals: Blood pressure 111/75, pulse 89, temperature 99.2 F (37.3 C), temperature source Oral, resp. rate 15, height '5\' 4"'$  (1.626 m), weight 71.6 kg, SpO2 90 %.   Exam: Gen Exam:Alert awake-not in any distress HEENT:atraumatic, normocephalic Chest: B/L clear to auscultation anteriorly CVS:S1S2 regular Abdomen:soft non tender, non distended Extremities:no edema-erythema has resolved. Neurology: Non focal Skin: no rash  Pertinent Labs/Radiology:    Latest Ref Rng & Units 02/08/2023    6:38 AM 02/07/2023   11:57 PM 02/07/2023    6:42 AM  CBC  WBC 4.0 - 10.5 K/uL 11.4  13.8  17.2   Hemoglobin 13.0 - 17.0 g/dL 10.5  10.9  11.3   Hematocrit 39.0 - 52.0 % 31.0  31.8  32.5   Platelets 150 - 400 K/uL 85  87  82     Lab  Results  Component Value Date   NA 129 (L) 02/11/2023   K 3.8 02/11/2023   CL 99 02/11/2023   CO2 21 (L) 02/11/2023      Assessment/Plan: Left> right leg cellulitis Significant improvement in erythema Leukocytosis has essentially resolved Initially on Rocephin has been switched to doxycycline for a few more days. 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 Per SW-he will he hard to place due to lack of insurance  EtOH withdrawal No tremors Completely awake/alert today Completed Ativan per CIWA protocol Will continue to counsel but do not think he has any intention of quitting at this point.    Hypokalemia/phosphatemia Repleted  Hypomagnesemia/hypophosphatemia In the setting of EtOH use Continues to have persistent hypomagnesemia-repeat 4 g IV mag sulfate today-will also place on mag oxide orally x 2 days. 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  Thrombocytopenia In the setting of EtOH  use Mild-stable for periodic follow-up  Circumferential wall thickening of the distal half of the thoracic esophagus Incidental finding on CTA chest Continue PPI-although he is not overtly  symptomatic. Will need outpatient follow-up with GI  Nodular opacity right upper lobe, pleural-based density left upper lobe Incidental finding on CT chest Radiology recommending repeat CT chest in 3 months  Bilateral pretibial lower extremity wounds Present prior to admission Wound care team following Continue dressing per wound care team  Homelessness Lives in van-found covered with feces Per ED documentation Social worker eval  BMI: Estimated body mass index is 27.09 kg/m as calculated from the following:   Height as of this encounter: '5\' 4"'$  (1.626 m).   Weight as of this encounter: 71.6 kg.   Code status:   Code Status: Full Code   DVT Prophylaxis: SCDs Start: 02/07/23 2313 Place and maintain sequential compression device Start: 02/07/23 1345SCDs given thrombocytopenia   Family Communication: None at bedside   Disposition Plan: Status is: Observation The patient will require care spanning > 2 midnights and should be moved to inpatient because: Severity of illness   Planned Discharge Destination:Home versus SNF   Diet: Diet Order             Diet regular Room service appropriate? Yes with Assist; Fluid consistency: Thin  Diet effective now                     Antimicrobial agents: Anti-infectives (From admission, onward)    Start     Dose/Rate Route Frequency Ordered Stop   02/10/23 1415  doxycycline (VIBRA-TABS) tablet 100 mg        100 mg Oral Every 12 hours 02/10/23 1317 02/13/23 0959   02/08/23 0000  vancomycin (VANCOCIN) IVPB 1000 mg/200 mL premix  Status:  Discontinued        1,000 mg 200 mL/hr over 60 Minutes Intravenous  Once 02/07/23 2312 02/07/23 2317   02/08/23 0000  ceFEPIme (MAXIPIME) 2 g in sodium chloride 0.9 % 100 mL IVPB  Status:  Discontinued        2 g 200 mL/hr over 30 Minutes Intravenous  Once 02/07/23 2312 02/07/23 2319   02/07/23 1600  vancomycin (VANCOREADY) IVPB 750 mg/150 mL  Status:  Discontinued       See Hyperspace for  full Linked Orders Report.   750 mg 150 mL/hr over 60 Minutes Intravenous Every 12 hours 02/06/23 2113 02/07/23 0957   02/07/23 1600  cefTRIAXone (ROCEPHIN) 2 g in sodium chloride 0.9 % 100 mL IVPB  Status:  Discontinued        2 g 200 mL/hr over 30 Minutes Intravenous Every 24 hours 02/07/23 0957 02/10/23 1317   02/07/23 0600  ceFEPIme (MAXIPIME) 2 g in sodium chloride 0.9 % 100 mL IVPB  Status:  Discontinued        2 g 200 mL/hr over 30 Minutes Intravenous Every 8 hours 02/06/23 2113 02/07/23 0957   02/06/23 2115  vancomycin (VANCOREADY) IVPB 1750 mg/350 mL       See Hyperspace for full Linked Orders Report.   1,750 mg 175 mL/hr over 120 Minutes Intravenous  Once 02/06/23 2113 02/07/23 0455   02/06/23 1615  cefTRIAXone (ROCEPHIN) 2 g in sodium chloride 0.9 % 100 mL IVPB        2 g 200 mL/hr over 30 Minutes Intravenous STAT 02/06/23 1611 02/06/23 1824   02/06/23 1615  doxycycline (VIBRAMYCIN) 100 mg in sodium chloride 0.9 % 250 mL IVPB  100 mg 125 mL/hr over 120 Minutes Intravenous  Once 02/06/23 1611 02/06/23 1947        MEDICATIONS: Scheduled Meds:  Chlorhexidine Gluconate Cloth  6 each Topical Q0600   doxycycline  100 mg Oral Q000111Q   folic acid  1 mg Oral Daily   guaiFENesin  600 mg Oral BID    morphine injection  1 mg Intravenous Once   multivitamin with minerals  1 tablet Oral Daily   mupirocin ointment  1 Application Nasal BID   nicotine  14 mg Transdermal Daily   nystatin   Topical TID   thiamine  100 mg Oral Daily   Or   thiamine  100 mg Intravenous Daily   Continuous Infusions:  magnesium sulfate bolus IVPB 4 g (02/11/23 0933)   PRN Meds:.acetaminophen **OR** acetaminophen, albuterol, oxyCODONE-acetaminophen, Racepinephrine HCl   I have personally reviewed following labs and imaging studies  LABORATORY DATA: CBC: Recent Labs  Lab 02/06/23 0930 02/06/23 1333 02/06/23 2119 02/06/23 2142 02/07/23 0642 02/07/23 2357 02/08/23 0638  WBC 3.8*  --   19.5*  --  17.2* 13.8* 11.4*  HGB 12.6*   < > 12.0* 11.9* 11.3* 10.9* 10.5*  HCT 35.3*   < > 35.2* 35.0* 32.5* 31.8* 31.0*  MCV 100.3*  --  104.5*  --  102.5* 103.9* 105.4*  PLT 78*  --  83*  --  82* 87* 85*   < > = values in this interval not displayed.     Basic Metabolic Panel: Recent Labs  Lab 02/06/23 0930 02/06/23 1333 02/07/23 0642 02/07/23 2357 02/08/23 [id number] 02/09/23 0555 02/10/23 0835 02/11/23 0338  NA 131*   < > 135  --  134* 130* 131* 129*  K 2.9*   < > 3.4*  --  3.2* 3.4* 3.5 3.8  CL 95*   < > 102  --  102 99 99 99  CO2 21*   < > 27  --  21* 25 23 21*  GLUCOSE 79   < > 94  --  97 93 94 96  BUN 7*   < > 8  --  10 5* <5* <5*  CREATININE 0.95   < > 0.82  --  0.67 0.59* 0.59* 0.66  CALCIUM 8.1*   < > 7.6*  --  7.7* 8.1* 8.6* 8.8*  MG 1.3*   < > 2.2 2.1 2.0 1.4* 1.5* 1.5*  PHOS 1.7*  --  3.7 2.5 2.9 2.6  --   --    < > = values in this interval not displayed.     GFR: Estimated Creatinine Clearance: 86.9 mL/min (by C-G formula based on SCr of 0.66 mg/dL).  Liver Function Tests: Recent Labs  Lab 02/06/23 0930 02/06/23 2119 02/07/23 0642 02/08/23 0638  AST 81* 131* 117* 61*  ALT 29 45* 52* 41  ALKPHOS 151* 62 54 63  BILITOT 1.6* 0.7 0.8 0.6  PROT 6.3* 6.3* 5.9* 5.6*  ALBUMIN 3.0* 2.8* 2.5* 2.4*    Recent Labs  Lab 02/06/23 2119  LIPASE 29    Recent Labs  Lab 02/06/23 1952  AMMONIA 21     Coagulation Profile: Recent Labs  Lab 02/06/23 1952  INR 1.2     Cardiac Enzymes: Recent Labs  Lab 02/06/23 0930  CKTOTAL 216     BNP (last 3 results) No results for input(s): "PROBNP" in the last 8760 hours.  Lipid Profile: No results for input(s): "CHOL", "HDL", "LDLCALC", "TRIG", "CHOLHDL", "LDLDIRECT" in the last 72 hours.  Thyroid Function Tests: No results for input(s): "TSH", "T4TOTAL", "FREET4", "T3FREE", "THYROIDAB" in the last 72 hours.   Anemia Panel: No results for input(s): "VITAMINB12", "FOLATE", "FERRITIN", "TIBC", "IRON",  "RETICCTPCT" in the last 72 hours.   Urine analysis: No results found for: "COLORURINE", "APPEARANCEUR", "LABSPEC", "PHURINE", "GLUCOSEU", "HGBUR", "BILIRUBINUR", "KETONESUR", "PROTEINUR", "UROBILINOGEN", "NITRITE", "LEUKOCYTESUR"  Sepsis Labs: Lactic Acid, Venous    Component Value Date/Time   LATICACIDVEN 1.9 02/06/2023 2352    MICROBIOLOGY: Recent Results (from the past 240 hour(s))  Resp panel by RT-PCR (RSV, Flu A&B, Covid) Anterior Nasal Swab     Status: None   Collection Time: 02/06/23  9:39 AM   Specimen: Anterior Nasal Swab  Result Value Ref Range Status   SARS Coronavirus 2 by RT PCR NEGATIVE NEGATIVE Final   Influenza A by PCR NEGATIVE NEGATIVE Final   Influenza B by PCR NEGATIVE NEGATIVE Final    Comment: (NOTE) The Xpert Xpress SARS-CoV-2/FLU/RSV plus assay is intended as an aid in the diagnosis of influenza from Nasopharyngeal swab specimens and should not be used as a sole basis for treatment. Nasal washings and aspirates are unacceptable for Xpert Xpress SARS-CoV-2/FLU/RSV testing.  Fact Sheet for Patients: EntrepreneurPulse.com.au  Fact Sheet for Healthcare Providers: IncredibleEmployment.be  This test is not yet approved or cleared by the [place] FDA and has been authorized for detection and/or diagnosis of SARS-CoV-2 by FDA under an Emergency Use Authorization (EUA). This EUA will remain in effect (meaning this test can be used) for the duration of the COVID-19 declaration under Section 564(b)(1) of the Act, 21 U.S.C. section 360bbb-3(b)(1), unless the authorization is terminated or revoked.     Resp Syncytial Virus by PCR NEGATIVE NEGATIVE Final    Comment: (NOTE) Fact Sheet for Patients: EntrepreneurPulse.com.au  Fact Sheet for Healthcare Providers: IncredibleEmployment.be  This test is not yet approved or cleared by the [place] FDA and has been authorized for  detection and/or diagnosis of SARS-CoV-2 by FDA under an Emergency Use Authorization (EUA). This EUA will remain in effect (meaning this test can be used) for the duration of the COVID-19 declaration under Section 564(b)(1) of the Act, 21 U.S.C. section 360bbb-3(b)(1), unless the authorization is terminated or revoked.  Performed at [facility], [place] [address], [place], [place] [address]   MRSA Next Gen by PCR, Nasal     Status: Abnormal   Collection Time: 02/06/23  9:06 PM   Specimen: Nasal Mucosa; Nasal Swab  Result Value Ref Range Status   MRSA by PCR Next Gen DETECTED (A) NOT DETECTED Final    Comment: RESULT CALLED TO, READ BACK BY AND VERIFIED WITH: K JOHNS,RN'@0232'$  02/08/23 [place] (NOTE) The GeneXpert MRSA Assay (FDA approved for NASAL specimens only), is one component of a comprehensive MRSA colonization surveillance program. It is not intended to diagnose MRSA infection nor to guide or monitor treatment for MRSA infections. Test performance is not FDA approved in patients less than 15 years old. Performed at [facility], [place] [address], [place], [place] [address]   Culture, blood (x 2)     Status: None   Collection Time: 02/06/23  9:19 PM   Specimen: BLOOD  Result Value Ref Range Status   Specimen Description BLOOD RIGHT ANTECUBITAL  Final   Special Requests   Final    BOTTLES DRAWN AEROBIC AND ANAEROBIC Blood Culture adequate volume   Culture   Final    NO GROWTH 5 DAYS Performed at [facility], [place]  [address] [place], [place] [address]    Report Status 02/11/2023 FINAL  Final  Gastrointestinal Panel by PCR , Stool     Status: Abnormal   Collection Time: 02/06/23  9:26 PM   Specimen: Nasal Mucosa; Stool  Result Value Ref Range Status   Campylobacter species NOT DETECTED NOT DETECTED Final   Plesimonas shigelloides NOT DETECTED NOT DETECTED Final   Salmonella species NOT DETECTED NOT DETECTED Final   Yersinia enterocolitica NOT DETECTED NOT  DETECTED Final   Vibrio species NOT DETECTED NOT DETECTED Final   Vibrio cholerae NOT DETECTED NOT DETECTED Final   Enteroaggregative E coli (EAEC) NOT DETECTED NOT DETECTED Final   Enteropathogenic E coli (EPEC) DETECTED (A) NOT DETECTED Final    Comment: RESULT CALLED TO, READ BACK BY AND VERIFIED WITH: JENN COOK AT 1844 ON 02/08/23 BY SS    Enterotoxigenic E coli (ETEC) NOT DETECTED NOT DETECTED Final   Shiga like toxin producing E coli (STEC) NOT DETECTED NOT DETECTED Final   Shigella/Enteroinvasive E coli (EIEC) NOT DETECTED NOT DETECTED Final   Cryptosporidium NOT DETECTED NOT DETECTED Final   Cyclospora cayetanensis NOT DETECTED NOT DETECTED Final   Entamoeba histolytica NOT DETECTED NOT DETECTED Final   Giardia lamblia NOT DETECTED NOT DETECTED Final   Adenovirus F40/41 NOT DETECTED NOT DETECTED Final   Astrovirus NOT DETECTED NOT DETECTED Final   Norovirus GI/GII NOT DETECTED NOT DETECTED Final   Rotavirus A NOT DETECTED NOT DETECTED Final   Sapovirus (I, II, IV, and V) NOT DETECTED NOT DETECTED Final    Comment: Performed at [facility], [address] [place], [place] [address]  Culture, blood (x 2)     Status: None   Collection Time: 02/06/23  9:37 PM   Specimen: BLOOD  Result Value Ref Range Status   Specimen Description BLOOD SITE NOT SPECIFIED  Final   Special Requests   Final    BOTTLES DRAWN AEROBIC AND ANAEROBIC Blood Culture adequate volume   Culture   Final    NO GROWTH 5 DAYS Performed at [facility], [place] [address] [place], [place] [address]    Report Status 02/11/2023 FINAL  Final  Respiratory (~20 pathogens) panel by PCR     Status: None   Collection Time: 02/06/23  9:41 PM   Specimen: Nasopharyngeal Swab; Respiratory  Result Value Ref Range Status   Adenovirus NOT DETECTED NOT DETECTED Final   Coronavirus 229E NOT DETECTED NOT DETECTED Final    Comment: (NOTE) The Coronavirus on the Respiratory Panel, DOES NOT test for the novel   Coronavirus (2019 nCoV)    Coronavirus HKU1 NOT DETECTED NOT DETECTED Final   Coronavirus NL63 NOT DETECTED NOT DETECTED Final   Coronavirus OC43 NOT DETECTED NOT DETECTED Final   Metapneumovirus NOT DETECTED NOT DETECTED Final   Rhinovirus / Enterovirus NOT DETECTED NOT DETECTED Final   Influenza A NOT DETECTED NOT DETECTED Final   Influenza B NOT DETECTED NOT DETECTED Final   Parainfluenza Virus 1 NOT DETECTED NOT DETECTED Final   Parainfluenza Virus 2 NOT DETECTED NOT DETECTED Final   Parainfluenza Virus 3 NOT DETECTED NOT DETECTED Final   Parainfluenza Virus 4 NOT DETECTED NOT DETECTED Final   Respiratory Syncytial Virus NOT DETECTED NOT DETECTED Final   Bordetella pertussis NOT DETECTED NOT DETECTED Final   Bordetella Parapertussis NOT DETECTED NOT DETECTED Final   Chlamydophila pneumoniae NOT DETECTED NOT DETECTED Final   Mycoplasma pneumoniae NOT DETECTED NOT DETECTED Final    Comment:  Performed at [facility], [place] [address], [place], [place] [address]    RADIOLOGY STUDIES/RESULTS: No results found.   LOS: 4 days   Oren Binet, MD  [facility]    To contact the attending provider between 7A-7P or the covering provider during after hours 7P-7A, please log into the web site [email or web address] and access using universal [place] password for that web site. If you do not have the password, please call the hospital operator.  02/11/2023, 10:24 AM

## 2023-02-12 LAB — CBC WITH DIFFERENTIAL/PLATELET
Abs Immature Granulocytes: 0.18 10*3/uL — ABNORMAL HIGH (ref 0.00–0.07)
Basophils Absolute: 0.1 10*3/uL (ref 0.0–0.1)
Basophils Relative: 1 %
Eosinophils Absolute: 0.4 10*3/uL (ref 0.0–0.5)
Eosinophils Relative: 5 %
HCT: 39 % (ref 39.0–52.0)
Hemoglobin: 13.4 g/dL (ref 13.0–17.0)
Immature Granulocytes: 3 %
Lymphocytes Relative: 28 %
Lymphs Abs: 2 10*3/uL (ref 0.7–4.0)
MCH: 34.4 pg — ABNORMAL HIGH (ref 26.0–34.0)
MCHC: 34.4 g/dL (ref 30.0–36.0)
MCV: 100 fL (ref 80.0–100.0)
Monocytes Absolute: 1.8 10*3/uL — ABNORMAL HIGH (ref 0.1–1.0)
Monocytes Relative: 26 %
Neutro Abs: 2.6 10*3/uL (ref 1.7–7.7)
Neutrophils Relative %: 37 %
Platelets: 214 10*3/uL (ref 150–400)
RBC: 3.9 MIL/uL — ABNORMAL LOW (ref 4.22–5.81)
RDW: 11.9 % (ref 11.5–15.5)
WBC: 7 10*3/uL (ref 4.0–10.5)
nRBC: 0 % (ref 0.0–0.2)

## 2023-02-12 LAB — COMPREHENSIVE METABOLIC PANEL
ALT: 26 U/L (ref 0–44)
AST: 28 U/L (ref 15–41)
Albumin: 2.9 g/dL — ABNORMAL LOW (ref 3.5–5.0)
Alkaline Phosphatase: 78 U/L (ref 38–126)
Anion gap: 6 (ref 5–15)
BUN: 9 mg/dL (ref 8–23)
CO2: 29 mmol/L (ref 22–32)
Calcium: 8.9 mg/dL (ref 8.9–10.3)
Chloride: 94 mmol/L — ABNORMAL LOW (ref 98–111)
Creatinine, Ser: 0.74 mg/dL (ref 0.61–1.24)
GFR, Estimated: >60 mL/min (ref >60)
Glucose, Bld: 102 mg/dL — ABNORMAL HIGH (ref 70–99)
Potassium: 3.8 mmol/L (ref 3.5–5.1)
Sodium: 129 mmol/L — ABNORMAL LOW (ref 135–145)
Total Bilirubin: 0.8 mg/dL (ref 0.3–1.2)
Total Protein: 6.7 g/dL (ref 6.5–8.1)

## 2023-02-12 LAB — OSMOLALITY: Osmolality: 277 mOsm/kg (ref 275–295)

## 2023-02-12 LAB — BRAIN NATRIURETIC PEPTIDE: B Natriuretic Peptide: 88.6 pg/mL (ref 0.0–100.0)

## 2023-02-12 LAB — URIC ACID: Uric Acid, Serum: 3.2 mg/dL — ABNORMAL LOW (ref 3.7–8.6)

## 2023-02-12 LAB — MAGNESIUM: Magnesium: 1.7 mg/dL (ref 1.7–2.4)

## 2023-02-12 LAB — OSMOLALITY, URINE: Osmolality, Ur: 296 mOsm/kg — ABNORMAL LOW (ref 300–900)

## 2023-02-12 MED ORDER — FUROSEMIDE 10 MG/ML IJ SOLN
40.0000 mg | Freq: Once | INTRAMUSCULAR | Status: DC
  Filled 2023-02-12: qty 4

## 2023-02-12 NOTE — Plan of Care (Signed)
  Problem: Fluid Volume: Goal: Hemodynamic stability will improve Outcome: Progressing   Problem: Clinical Measurements: Goal: Diagnostic test results will improve Outcome: Progressing Goal: Signs and symptoms of infection will decrease Outcome: Progressing   Problem: Respiratory: Goal: Ability to maintain adequate ventilation will improve Outcome: Progressing   Problem: Clinical Measurements: Goal: Ability to avoid or minimize complications of infection will improve Outcome: Progressing   Problem: Skin Integrity: Goal: Skin integrity will improve Outcome: Progressing   Problem: Education: Goal: Knowledge of General Education information will improve Description: Including pain rating scale, medication(s)/side effects and non-pharmacologic comfort measures Outcome: Progressing   Problem: Health Behavior/Discharge Planning: Goal: Ability to manage health-related needs will improve Outcome: Progressing   Problem: Clinical Measurements: Goal: Ability to maintain clinical measurements within normal limits will improve Outcome: Progressing Goal: Will remain free from infection Outcome: Progressing Goal: Diagnostic test results will improve Outcome: Progressing Goal: Respiratory complications will improve Outcome: Progressing Goal: Cardiovascular complication will be avoided Outcome: Progressing   Problem: Activity: Goal: Risk for activity intolerance will decrease Outcome: Progressing   Problem: Nutrition: Goal: Adequate nutrition will be maintained Outcome: Progressing   Problem: Coping: Goal: Level of anxiety will decrease Outcome: Progressing   Problem: Elimination: Goal: Will not experience complications related to bowel motility Outcome: Progressing Goal: Will not experience complications related to urinary retention Outcome: Progressing   Problem: Pain Managment: Goal: General experience of comfort will improve Outcome: Progressing   Problem:  Safety: Goal: Ability to remain free from injury will improve Outcome: Progressing   Problem: Skin Integrity: Goal: Risk for impaired skin integrity will decrease Outcome: Progressing   

## 2023-02-12 NOTE — Progress Notes (Signed)
Mobility Specialist Progress Note   02/12/23 0950  Mobility  Activity Ambulated independently in room;Ambulated independently to bathroom  Level of [place] wheel walker  Distance Ambulated (ft) 15 ft  Range of Motion/Exercises Active;All extremities  Activity Response Tolerated well   Patient received in supine. Deferred hallway ambulation but requested assistance to bathroom. Ambulated independently with steady gait. Returned to bed without complaint or incident. Was left in supine with all needs met, call bell in reach.   [place] Nhan Qualley, BS EXP Mobility Specialist Please contact via SecureChat or Rehab office at [phone]

## 2023-02-12 NOTE — Progress Notes (Signed)
PROGRESS NOTE        PATIENT DETAILS Name: Joseph Bell Age: 62 y.o. Sex: male Date of Birth: 23-Aug-1961 Admit Date: 02/06/2023 Admitting Physician Evalee Mutton Kristeen Mans, MD BP:[id number], No Pcp Per  Brief Summary: Patient is a 62 y.o.  male with history of EtOH use, homelessness-presented to the ED with frequent falls, weakness-was found to have EtOH withdrawal, left> right leg cellulitis and numerous electrolyte abnormalities.  He was subsequently admitted to the [facility] hospitalist service.  Significant events: 3/4>> admit to [facility]  Significant studies: 3/4>> CT angio chest: No PE, circumferential wall thickening of distal half of thoracic esophagus. 3/4>> CT abdomen/pelvis: No acute pathology. 3/4>> CT C-spine: No acute findings. 3/4>> x-ray right knee: No fracture 3/4>> x-ray right fibula/tibia: No fracture 3/4>> x-ray left fibula/tibia: No fracture 3/5>> MRI brain: No acute intracranial process  Significant microbiology data: 3/4>> COVID/influenza/RSV PCR: Negative 3/4>> respiratory virus panel: Negative 3/4>> blood culture: Negative  Procedures: None  Consults: None  Subjective:   Patient in bed, appears comfortable, denies any headache, no fever, no chest pain or pressure, no shortness of breath , no abdominal pain. No new focal weakness.   Objective: Vitals: Blood pressure 123/84, pulse 97, temperature 97.8 F (36.6 C), temperature source Oral, resp. rate 17, height '5\' 4"'$  (1.626 m), weight 71.6 kg, SpO2 92 %.   Exam:  Awake Alert, No new F.N deficits, Normal affect [place].AT,PERRAL Supple Neck, No JVD,   Symmetrical Chest wall movement, Good air movement bilaterally, CTAB RRR,No Gallops, Rubs or new Murmurs,  +ve B.Sounds, Abd Soft, No tenderness,   No Cyanosis, Clubbing or edema    Assessment/Plan:  Left> right leg cellulitis Significant improvement in erythema Leukocytosis has essentially resolved Initially on Rocephin has been  switched to doxycycline for a few more days. 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 Per SW-he will he hard to place due to lack of insurance  EtOH withdrawal No tremors Completely awake/alert today Completed Ativan per CIWA protocol Will continue to counsel but do not think he has any intention of quitting at this point.    Hypokalemia/phosphatemia Repleted  Hypomagnesemia/hypophosphatemia In the setting of EtOH use Continues to have persistent hypomagnesemia-repeat 4 g IV mag sulfate today-will also place on mag oxide orally x 2 days - monitor. Although likely secondary to EtOH-will stop PPI   Hyponatremia SIADH, placed on fluid restriction, Lasix on 02/12/2023 and monitor.  Prolonged QTc Suspect due to electrolyte abnormalities Repeat twelve-lead EKG on 3/6 with resolution of prolonged QTc.  Thrombocytopenia In the setting of EtOH use Mild-stable for periodic follow-up  Circumferential wall thickening of the distal half of the thoracic esophagus Incidental finding on CTA chest Continue PPI-although he is not overtly symptomatic. Will need outpatient follow-up with GI  Nodular opacity right upper lobe, pleural-based density left upper lobe Incidental finding on CT chest Radiology recommending repeat CT chest in 3 months  Bilateral pretibial lower extremity wounds Present prior to admission Wound care team following Continue dressing per wound care team  Homelessness Lives in van-found covered  with feces Per ED documentation Social worker  eval  BMI: Estimated body mass index is 27.09 kg/m as calculated from the following:   Height as of this encounter: '5\' 4"'$  (1.626 m).   Weight as of this encounter: 71.6 kg.   Code status:   Code Status: Full Code   DVT Prophylaxis: SCDs Start: 02/07/23 2313 Place and maintain sequential compression device Start: 02/07/23 1345SCDs given thrombocytopenia   Family Communication: None at bedside   Disposition Plan: Status is: Observation The patient will require care spanning > 2 midnights and should be moved to inpatient because: Severity of illness   Planned Discharge Destination:Home versus SNF   Diet: Diet Order             Diet regular Room service appropriate? Yes with Assist; Fluid consistency: Thin; Fluid restriction: 1200 mL Fluid  Diet effective now                    MEDICATIONS: Scheduled Meds:  Chlorhexidine Gluconate Cloth  6 each Topical Q0600   doxycycline  100 mg Oral [id number]   folic acid  1 mg Oral Daily   furosemide  40 mg Intravenous Once   guaiFENesin  600 mg Oral BID   magnesium oxide  400 mg Oral BID    morphine injection  1 mg Intravenous Once   multivitamin with minerals  1 tablet Oral Daily   mupirocin ointment  1 Application Nasal BID   nicotine  14 mg Transdermal Daily   nystatin   Topical TID   thiamine  100 mg Oral Daily   Or   thiamine  100 mg Intravenous Daily   Continuous Infusions:   PRN Meds:.acetaminophen **OR** acetaminophen, albuterol, mouth rinse, oxyCODONE-acetaminophen, Racepinephrine HCl   I have personally reviewed following labs and imaging studies  LABORATORY DATA:  Recent Labs  Lab 02/06/23 2119 02/06/23 2142 02/07/23 0642 02/07/23 2357 02/08/23 0638 02/12/23 0336  WBC 19.5*  --  17.2* 13.8* 11.4* 7.0  HGB 12.0* 11.9* 11.3* 10.9* 10.5* 13.4  HCT 35.2* 35.0* 32.5* 31.8* 31.0* 39.0  PLT 83*  --  82* 87* 85* 214  MCV 104.5*  --  102.5* 103.9* 105.4* 100.0  MCH 35.6*  --  35.6* 35.6* 35.7* 34.4*  MCHC  34.1  --  34.8 34.3 33.9 34.4  RDW 12.8  --  13.0 13.0 13.0 11.9  LYMPHSABS  --   --   --   --   --  2.0  MONOABS  --   --   --   --   --  1.8*  EOSABS  --   --   --   --   --  0.4  BASOSABS  --   --   --   --   --  0.1    Recent Labs  Lab 02/06/23 0930 02/06/23 1333 02/06/23 1952 02/06/23 2119 02/06/23 2142 02/06/23 2352 02/07/23 [id number] 02/07/23 2357 02/08/23 [id number] 02/09/23 0555 02/10/23 0835 02/11/23 0338 02/12/23 0336  NA 131*   < >  --  133*   < >  --  135  --  134* 130* 131* 129* 129*  K 2.9*   < >  --  3.7   < >  --  3.4*  --  3.2* 3.4* 3.5 3.8 3.8  CL 95*   < >  --  99  --   --  102  --  102 99 99 99 94*  CO2 21*  --   --  22  --   --  27  --  21* 25 23 21* 29  ANIONGAP 15  --   --  12  --   --  6  --  '11 6 9 9 6  '$ GLUCOSE 79   < >  --  103*  --   --  94  --  97 93 94 96 102*  BUN 7*   < >  --  9  --   --  8  --  10 5* <5* <5* 9  CREATININE 0.95   < >  --  0.88  --   --  0.82  --  0.67 0.59* 0.59* 0.66 0.74  AST 81*  --   --  131*  --   --  117*  --  61*  --   --   --  28  ALT 29  --   --  45*  --   --  52*  --  41  --   --   --  26  ALKPHOS 151*  --   --  62  --   --  54  --  63  --   --   --  78  BILITOT 1.6*  --   --  0.7  --   --  0.8  --  0.6  --   --   --  0.8  ALBUMIN 3.0*  --   --  2.8*  --   --  2.5*  --  2.4*  --   --   --  2.9*  PROCALCITON  --   --   --  24.50  --   --   --   --   --   --   --   --   --   LATICACIDVEN  --   --   --  3.6*  --  1.9  --   --   --   --   --   --   --   INR  --   --  1.2  --   --   --   --   --   --   --   --   --   --   TSH  --   --   --  3.039  --   --   --   --   --   --   --   --   --   AMMONIA  --   --  21  --   --   --   --   --   --   --   --   --   --   BNP 139.0*  --   --   --   --   --   --   --   --   --   --   --  88.6  MG 1.3*  --   --  2.0  --   --  2.2   < > 2.0 1.4* 1.5* 1.5* 1.7  CALCIUM 8.1*  --   --  8.0*  --   --  7.6*  --  7.7* 8.1* 8.6* 8.8* 8.9   < > = values in this interval not displayed.     RADIOLOGY  STUDIES/RESULTS: No results found.   LOS: 5 days   Signature  -    Lala Lund M.D on 02/12/2023 at 10:31 AM   -  To page go to [email or web address]

## 2023-02-13 LAB — COMPREHENSIVE METABOLIC PANEL
ALT: 29 U/L (ref 0–44)
AST: 32 U/L (ref 15–41)
Albumin: 2.7 g/dL — ABNORMAL LOW (ref 3.5–5.0)
Alkaline Phosphatase: 84 U/L (ref 38–126)
Anion gap: 8 (ref 5–15)
BUN: 11 mg/dL (ref 8–23)
CO2: 25 mmol/L (ref 22–32)
Calcium: 8.7 mg/dL — ABNORMAL LOW (ref 8.9–10.3)
Chloride: 95 mmol/L — ABNORMAL LOW (ref 98–111)
Creatinine, Ser: 0.72 mg/dL (ref 0.61–1.24)
GFR, Estimated: >60 mL/min (ref >60)
Glucose, Bld: 115 mg/dL — ABNORMAL HIGH (ref 70–99)
Potassium: 3.5 mmol/L (ref 3.5–5.1)
Sodium: 128 mmol/L — ABNORMAL LOW (ref 135–145)
Total Bilirubin: 0.7 mg/dL (ref 0.3–1.2)
Total Protein: 6.7 g/dL (ref 6.5–8.1)

## 2023-02-13 LAB — CBC WITH DIFFERENTIAL/PLATELET
Abs Immature Granulocytes: 0.11 10*3/uL — ABNORMAL HIGH (ref 0.00–0.07)
Basophils Absolute: 0.1 10*3/uL (ref 0.0–0.1)
Basophils Relative: 2 %
Eosinophils Absolute: 0.3 10*3/uL (ref 0.0–0.5)
Eosinophils Relative: 5 %
HCT: 37.1 % — ABNORMAL LOW (ref 39.0–52.0)
Hemoglobin: 13.4 g/dL (ref 13.0–17.0)
Immature Granulocytes: 2 %
Lymphocytes Relative: 30 %
Lymphs Abs: 2.1 10*3/uL (ref 0.7–4.0)
MCH: 35.3 pg — ABNORMAL HIGH (ref 26.0–34.0)
MCHC: 36.1 g/dL — ABNORMAL HIGH (ref 30.0–36.0)
MCV: 97.6 fL (ref 80.0–100.0)
Monocytes Absolute: 1.8 10*3/uL — ABNORMAL HIGH (ref 0.1–1.0)
Monocytes Relative: 26 %
Neutro Abs: 2.5 10*3/uL (ref 1.7–7.7)
Neutrophils Relative %: 35 %
Platelets: 269 10*3/uL (ref 150–400)
RBC: 3.8 MIL/uL — ABNORMAL LOW (ref 4.22–5.81)
RDW: 11.9 % (ref 11.5–15.5)
WBC: 6.8 10*3/uL (ref 4.0–10.5)
nRBC: 0 % (ref 0.0–0.2)

## 2023-02-13 LAB — TSH: TSH: 11.926 u[IU]/mL — ABNORMAL HIGH (ref 0.350–4.500)

## 2023-02-13 LAB — MAGNESIUM: Magnesium: 1.6 mg/dL — ABNORMAL LOW (ref 1.7–2.4)

## 2023-02-13 LAB — CORTISOL: Cortisol, Plasma: 4.7 ug/dL

## 2023-02-13 LAB — BRAIN NATRIURETIC PEPTIDE: B Natriuretic Peptide: 89.3 pg/mL (ref 0.0–100.0)

## 2023-02-13 MED ORDER — MAGNESIUM SULFATE 4 GM/100ML IV SOLN
4.0000 g | Freq: Once | INTRAVENOUS | Status: AC
  Administered 2023-02-13: 4 g via INTRAVENOUS
  Filled 2023-02-13: qty 100

## 2023-02-13 MED ORDER — POTASSIUM CHLORIDE CRYS ER 20 MEQ PO TBCR
40.0000 meq | EXTENDED_RELEASE_TABLET | Freq: Once | ORAL | Status: AC
  Administered 2023-02-13: 40 meq via ORAL
  Filled 2023-02-13: qty 2

## 2023-02-13 NOTE — Progress Notes (Signed)
Physical Therapy Treatment Patient Details Name: Joseph Bell MRN: [id number] DOB: 05/07/1961 Today's Date: 02/13/2023   History of Present Illness Joseph is a 62 y.o. male arriving via EMS after being found down and covered with stool in his [name]. Found to have SOB on exertion, RLS rhonchi bilaterally, and BLE cellulitis. CT head with no acute findings; CXR with diffuse interstitial prominence (pulmonary edema vs viral infection). PMH significant for alcohol abuse, homelessness, and subdural hematoma in Dec 2023.    Joseph Comments    Joseph tolerated today's session well and is continuing to show progression with therapy. Joseph able to complete all mobility with minG and use of RW today, no LOB noted throughout session, but min verbal cues provided for safety and RW management around obstacles. Joseph increased ambulation distance today but reported pain on bottoms of feet towards the end, boots found in room and he may benefit from donning them prior to ambulation. Acute Joseph will continue to follow up with Joseph to maximize independence with mobility during admission, discharge recommendations updated to home, anticipate no Joseph follow up at discharge pending continued progression.    Recommendations for follow up therapy are one component of a multi-disciplinary discharge planning process, led by the attending physician.  Recommendations may be updated based on patient status, additional functional criteria and insurance authorization.  Follow Up Recommendations  No Joseph follow up Can patient physically be transported by private vehicle: Yes   Assistance Recommended at Discharge Intermittent Supervision/Assistance  Patient can return home with the following A little help with walking and/or transfers;Help with stairs or ramp for entrance;Assist for transportation   Equipment Recommendations  None recommended by Joseph    Recommendations for Other Services       Precautions / Restrictions  Precautions Precautions: Fall Precaution Comments: BLE wounds Restrictions Weight Bearing Restrictions: No     Mobility  Bed Mobility Overal bed mobility: Needs Assistance Bed Mobility: Supine to Sit, Sit to Supine     Supine to sit: Min guard, HOB elevated Sit to supine: Min guard, HOB elevated   General bed mobility comments: use of rail, minG for safety and line management    Transfers Overall transfer level: Needs assistance Equipment used: Rolling walker (2 wheels) Transfers: Sit to/from Stand Sit to Stand: Min guard           General transfer comment: minG for safety and balance, cued intermittently for hand placement    Ambulation/Gait Ambulation/Gait assistance: Min guard Gait Distance (Feet): 300 Feet Assistive device: Rolling walker (2 wheels) Gait Pattern/deviations: Step-through pattern, Decreased stride length, Wide base of support, Drifts right/left Gait velocity: WFL     General Gait Details: improved gait speed and ambulation tolerance, cues provided for proximity to RW especially with fatigue. Mild path deviation and verbal cues provided for RW management around obstacles   Stairs             Wheelchair Mobility    Modified Rankin (Stroke Patients Only)       Balance Overall balance assessment: Needs assistance Sitting-balance support: No upper extremity supported, Feet supported Sitting balance-Leahy Scale: Good Sitting balance - Comments: stable sitting balance on EOB   Standing balance support: Bilateral upper extremity supported, During functional activity Standing balance-Leahy Scale: Fair Standing balance comment: fair standing balance, use of RW with ambulation, no LOB  Cognition Arousal/Alertness: Awake/alert Behavior During Therapy: WFL for tasks assessed/performed Overall Cognitive Status: No family/caregiver present to determine baseline cognitive functioning Area of Impairment:  Safety/judgement, Following commands                       Following Commands: Follows one step commands consistently Safety/Judgement: Decreased awareness of safety, Decreased awareness of deficits   Problem Solving: Requires verbal cues General Comments: improved from previous sessions but verbal cues required for safety with mobility        Exercises Other Exercises Other Exercises: sit<>stand x10 reps for BLE strengthening    General Comments General comments (skin integrity, edema, etc.): VSS on room air      Pertinent Vitals/Pain Pain Assessment Pain Assessment: Faces Faces Pain Scale: Hurts a little bit Pain Location: BLE Pain Descriptors / Indicators: Aching Pain Intervention(s): Monitored during session, Limited activity within patient's tolerance, Repositioned    Home Living                          Prior Function            Joseph Goals (current goals can now be found in the care plan section) Acute Rehab Joseph Goals Patient Stated Goal: get better Joseph Goal Formulation: With patient Time For Goal Achievement: 02/21/23 Potential to Achieve Goals: Good Progress towards Joseph goals: Progressing toward goals    Frequency    Min 3X/week      Joseph Plan Discharge plan needs to be updated;Frequency needs to be updated    Co-evaluation              AM-PAC Joseph "6 Clicks" Mobility   Outcome Measure  Help needed turning from your back to your side while in a flat bed without using bedrails?: A Little Help needed moving from lying on your back to sitting on the side of a flat bed without using bedrails?: A Little Help needed moving to and from a bed to a chair (including a wheelchair)?: A Little Help needed standing up from a chair using your arms (e.g., wheelchair or bedside chair)?: A Little Help needed to walk in hospital room?: A Little Help needed climbing 3-5 steps with a railing? : A Lot 6 Click Score: 17    End of Session Equipment  Utilized During Treatment: Gait belt Activity Tolerance: Patient tolerated treatment well Patient left: in bed;with call bell/phone within reach;with bed alarm set Nurse Communication: Mobility status Joseph Visit Diagnosis: Repeated falls (R29.6);Muscle weakness (generalized) (M62.81);Difficulty in walking, not elsewhere classified (R26.2)     Time: [id number] Joseph Time Calculation (min) (ACUTE ONLY): 16 min  Charges:  $Gait Training: 8-22 mins                     Joseph Bell, Joseph Bell Acute Rehabilitation Services Office [phone]    Joseph Bell 02/13/2023, 1:49 PM

## 2023-02-13 NOTE — Progress Notes (Signed)
Mobility Specialist Progress Note   02/13/23 1800  Mobility  Activity Ambulated with assistance in hallway  Level of Assistance Contact guard assist, steadying assist  Assistive Device Front wheel walker  Distance Ambulated (ft) 310 ft  Activity Response Tolerated well  Mobility Referral Yes  $Mobility charge 1 Mobility   Pt found sittting EOB c/o bilat. Foot pain but agreeable. Pt needing no physical assistance throughout session but CGA used d/t impulsivity. Ambulated in hallway w/ slow and steady gait. No new complaints present but still expressing discomfort in feet. Returned back to EOB w/o fault, call bell in reach and bed alarm on. RN notified about pain.   [place] F[place] Mobility Specialist Please contact via SecureChat or  Rehab office at [phone]

## 2023-02-13 NOTE — Progress Notes (Signed)
PROGRESS NOTE        PATIENT DETAILS Name: Joseph Bell Age: 62 y.o. Sex: male Date of Birth: [birth year] Admit Date: 02/06/2023 Admitting Physician Evalee Mutton Kristeen Mans, MD [id number], No Pcp Per  Brief Summary: Patient is a 62 y.o.  male with history of EtOH use, homelessness-presented to the ED with frequent falls, weakness-was found to have EtOH withdrawal, left> right leg cellulitis and numerous electrolyte abnormalities.  He was subsequently [facility] to the [facility].  Significant events: 3/4>> admit to [facility]  Significant studies: 3/4>> CT angio chest: No PE, circumferential wall thickening of distal half of thoracic esophagus. 3/4>> CT abdomen/pelvis: No acute pathology. 3/4>> CT C-spine: No acute findings. 3/4>> x-ray right knee: No fracture 3/4>> x-ray right fibula/tibia: No fracture 3/4>> x-ray left fibula/tibia: No fracture 3/5>> MRI brain: No acute intracranial process  Significant microbiology data: 3/4>> COVID/influenza/RSV PCR: Negative 3/4>> respiratory virus panel: Negative 3/4>> blood culture: Negative  Procedures: None  Consults: None  Subjective:   Patient in bed, appears comfortable, denies any headache, no fever, no chest pain or pressure, no shortness of breath , no abdominal pain. No focal weakness.   Objective: Vitals: Blood pressure 105/68, pulse 75, temperature 98.6 F (37 C), temperature source Oral, resp. rate 17, height '5\' 4"'$  (1.626 m), weight 71.6 kg, SpO2 94 %.   Exam:  Awake Alert, No new F.N deficits, Normal affect [place].AT,PERRAL Supple Neck, No JVD,   Symmetrical Chest wall movement, Good air movement bilaterally, CTAB RRR,No Gallops, Rubs or new Murmurs,  +ve B.Sounds, Abd Soft, No tenderness,   No Cyanosis, Clubbing or edema    Assessment/Plan:  Left> right leg cellulitis Significant improvement in erythema Leukocytosis has essentially resolved Initially on Rocephin has been  switched to doxycycline for a few more days. 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 Per SW-he will he hard to place due to lack of insurance  EtOH withdrawal No tremors Completely awake/alert today Completed Ativan per CIWA protocol Will continue to counsel but do not think he has any intention of quitting at this point.    Hypokalemia/phosphatemia Repleted  Hypomagnesemia/hypophosphatemia In the setting of EtOH use Continues to have persistent hypomagnesemia-repeat 4 g IV mag sulfate today-will also place on mag oxide orally x 2 days - monitor. Although likely secondary to EtOH-will stop PPI   Hyponatremia SIADH, placed on fluid restriction, Lasix on 02/12/2023, also check random cortisol and TSH, if no improvement in a day or 2 then Samsca provided blood pressure is stable.  Prolonged QTc Suspect due to electrolyte abnormalities Repeat twelve-lead EKG on 3/6 with resolution of prolonged QTc.  Thrombocytopenia In the setting of EtOH use Mild-stable for periodic follow-up  Circumferential wall thickening of the distal half of the thoracic esophagus Incidental finding on CTA chest Continue PPI-although he is not overtly symptomatic. Will need outpatient follow-up with GI  Nodular opacity right upper lobe, pleural-based density left upper lobe Incidental finding on CT chest Radiology recommending repeat CT chest in 3 months  Bilateral pretibial lower extremity wounds Present prior  to admission Wound care team following Continue dressing per wound care  team  Homelessness Lives in van-found covered with feces Per ED documentation Social worker eval  BMI: Estimated body mass index is 27.09 kg/m as calculated from the following:   Height as of this encounter: '5\' 4"'$  (1.626 m).   Weight as of this encounter: 71.6 kg.   Code status:   Code Status: Full Code   DVT Prophylaxis: SCDs Start: 02/07/23 2313 Place and maintain sequential compression device Start: 02/07/23 1345SCDs given thrombocytopenia   Family Communication: None at bedside   Disposition Plan: Status is: Observation The patient will require care spanning > 2 midnights and should be moved to inpatient because: Severity of illness   Planned Discharge Destination:Home versus SNF   Diet: Diet Order             Diet regular Room service appropriate? Yes with Assist; Fluid consistency: Thin; Fluid restriction: 1200 mL Fluid  Diet effective now                    MEDICATIONS: Scheduled Meds:  folic acid  1 mg Oral Daily   guaiFENesin  600 mg Oral BID   multivitamin with minerals  1 tablet Oral Daily   nicotine  14 mg Transdermal Daily   nystatin   Topical TID   thiamine  100 mg Oral Daily   Continuous Infusions:   PRN Meds:.acetaminophen **OR** acetaminophen, albuterol, mouth rinse, oxyCODONE-acetaminophen, Racepinephrine HCl   I have personally reviewed following labs and imaging studies  LABORATORY DATA:  Recent Labs  Lab 02/07/23 0642 02/07/23 2357 02/08/23 0638 02/12/23 0336 02/13/23 0223  WBC 17.2* 13.8* 11.4* 7.0 6.8  HGB 11.3* 10.9* 10.5* 13.4 13.4  HCT 32.5* 31.8* 31.0* 39.0 37.1*  PLT 82* 87* 85* 214 269  MCV 102.5* 103.9* 105.4* 100.0 97.6  MCH 35.6* 35.6* 35.7* 34.4* 35.3*  MCHC 34.8 34.3 33.9 34.4 36.1*  RDW 13.0 13.0 13.0 11.9 11.9  LYMPHSABS  --   --   --  2.0 2.1  MONOABS  --   --   --  1.8* 1.8*  EOSABS  --   --   --  0.4 0.3  BASOSABS  --   --   --  0.1 0.1    Recent Labs  Lab 02/06/23 1333 02/06/23 1952  02/06/23 2119 02/06/23 2142 02/06/23 2352 02/07/23 [id number] 02/07/23 2357 02/08/23 [id number] 02/09/23 0555 02/10/23 0835 02/11/23 0338 02/12/23 0336 02/13/23 0223  NA  --   --  133*   < >  --  135  --  134* 130* 131* 129* 129* 128*  K  --   --  3.7   < >  --  3.4*  --  3.2* 3.4* 3.5 3.8 3.8 3.5  CL  --   --  99  --   --  102  --  102 99 99 99 94* 95*  CO2   < >  --  22  --   --  27  --  21* 25 23 21* 29 25  ANIONGAP   < >  --  12  --   --  6  --  '11 6 9 9 6 8  '$ GLUCOSE  --   --  103*  --   --  94  --  97 93 94 96 102* 115*  BUN  --   --  9  --   --  8  --  10 5* <  5* <5* 9 11  CREATININE  --   --  0.88  --   --  0.82  --  0.67 0.59* 0.59* 0.66 0.74 0.72  AST  --   --  131*  --   --  117*  --  61*  --   --   --  28 32  ALT  --   --  45*  --   --  52*  --  41  --   --   --  26 29  ALKPHOS  --   --  62  --   --  54  --  63  --   --   --  78 84  BILITOT  --   --  0.7  --   --  0.8  --  0.6  --   --   --  0.8 0.7  ALBUMIN  --   --  2.8*  --   --  2.5*  --  2.4*  --   --   --  2.9* 2.7*  PROCALCITON  --   --  24.50  --   --   --   --   --   --   --   --   --   --   LATICACIDVEN  --   --  3.6*  --  1.9  --   --   --   --   --   --   --   --   INR  --  1.2  --   --   --   --   --   --   --   --   --   --   --   TSH  --   --  3.039  --   --   --   --   --   --   --   --   --   --   AMMONIA  --  21  --   --   --   --   --   --   --   --   --   --   --   BNP  --   --   --   --   --   --   --   --   --   --   --  88.6 89.3  MG   < >  --  2.0  --   --  2.2   < > 2.0 1.4* 1.5* 1.5* 1.7 1.6*  CALCIUM   < >  --  8.0*  --   --  7.6*  --  7.7* 8.1* 8.6* 8.8* 8.9 8.7*   < > = values in this interval not displayed.     RADIOLOGY STUDIES/RESULTS: No results found.   LOS: 6 days   Signature  -    Lala Lund M.D on 02/13/2023 at 12:16 PM   -  To page go to [email or web address]

## 2023-02-13 NOTE — Plan of Care (Signed)
  Problem: Fluid Volume: Goal: Hemodynamic stability will improve Outcome: Progressing   Problem: Clinical Measurements: Goal: Diagnostic test results will improve Outcome: Progressing Goal: Signs and symptoms of infection will decrease Outcome: Progressing   Problem: Respiratory: Goal: Ability to maintain adequate ventilation will improve Outcome: Progressing   Problem: Clinical Measurements: Goal: Ability to avoid or minimize complications of infection will improve Outcome: Progressing   Problem: Skin Integrity: Goal: Skin integrity will improve Outcome: Progressing   Problem: Education: Goal: Knowledge of General Education information will improve Description: Including pain rating scale, medication(s)/side effects and non-pharmacologic comfort measures Outcome: Progressing   Problem: Health Behavior/Discharge Planning: Goal: Ability to manage health-related needs will improve Outcome: Progressing   Problem: Clinical Measurements: Goal: Ability to maintain clinical measurements within normal limits will improve Outcome: Progressing Goal: Will remain free from infection Outcome: Progressing Goal: Diagnostic test results will improve Outcome: Progressing Goal: Respiratory complications will improve Outcome: Progressing Goal: Cardiovascular complication will be avoided Outcome: Progressing   Problem: Activity: Goal: Risk for activity intolerance will decrease Outcome: Progressing   Problem: Nutrition: Goal: Adequate nutrition will be maintained Outcome: Progressing   Problem: Coping: Goal: Level of anxiety will decrease Outcome: Progressing   Problem: Elimination: Goal: Will not experience complications related to bowel motility Outcome: Progressing Goal: Will not experience complications related to urinary retention Outcome: Progressing   Problem: Pain Managment: Goal: General experience of comfort will improve Outcome: Progressing   Problem:  Safety: Goal: Ability to remain free from injury will improve Outcome: Progressing   Problem: Skin Integrity: Goal: Risk for impaired skin integrity will decrease Outcome: Progressing   

## 2023-02-14 LAB — COMPREHENSIVE METABOLIC PANEL
ALT: 28 U/L (ref 0–44)
AST: 28 U/L (ref 15–41)
Albumin: 2.9 g/dL — ABNORMAL LOW (ref 3.5–5.0)
Alkaline Phosphatase: 74 U/L (ref 38–126)
Anion gap: 11 (ref 5–15)
BUN: 10 mg/dL (ref 8–23)
CO2: 21 mmol/L — ABNORMAL LOW (ref 22–32)
Calcium: 8.6 mg/dL — ABNORMAL LOW (ref 8.9–10.3)
Chloride: 98 mmol/L (ref 98–111)
Creatinine, Ser: 0.61 mg/dL (ref 0.61–1.24)
GFR, Estimated: >60 mL/min (ref >60)
Glucose, Bld: 93 mg/dL (ref 70–99)
Potassium: 3.7 mmol/L (ref 3.5–5.1)
Sodium: 130 mmol/L — ABNORMAL LOW (ref 135–145)
Total Bilirubin: 0.6 mg/dL (ref 0.3–1.2)
Total Protein: 6.7 g/dL (ref 6.5–8.1)

## 2023-02-14 LAB — CBC WITH DIFFERENTIAL/PLATELET
Abs Immature Granulocytes: 0.08 10*3/uL — ABNORMAL HIGH (ref 0.00–0.07)
Basophils Absolute: 0.1 10*3/uL (ref 0.0–0.1)
Basophils Relative: 1 %
Eosinophils Absolute: 0.3 10*3/uL (ref 0.0–0.5)
Eosinophils Relative: 4 %
HCT: 36.7 % — ABNORMAL LOW (ref 39.0–52.0)
Hemoglobin: 12.9 g/dL — ABNORMAL LOW (ref 13.0–17.0)
Immature Granulocytes: 1 %
Lymphocytes Relative: 30 %
Lymphs Abs: 2.3 10*3/uL (ref 0.7–4.0)
MCH: 35 pg — ABNORMAL HIGH (ref 26.0–34.0)
MCHC: 35.1 g/dL (ref 30.0–36.0)
MCV: 99.5 fL (ref 80.0–100.0)
Monocytes Absolute: 1.6 10*3/uL — ABNORMAL HIGH (ref 0.1–1.0)
Monocytes Relative: 20 %
Neutro Abs: 3.5 10*3/uL (ref 1.7–7.7)
Neutrophils Relative %: 44 %
Platelets: 323 10*3/uL (ref 150–400)
RBC: 3.69 MIL/uL — ABNORMAL LOW (ref 4.22–5.81)
RDW: 11.9 % (ref 11.5–15.5)
WBC: 7.9 10*3/uL (ref 4.0–10.5)
nRBC: 0 % (ref 0.0–0.2)

## 2023-02-14 LAB — T4, FREE: Free T4: 0.9 ng/dL (ref 0.61–1.12)

## 2023-02-14 LAB — TSH: TSH: 8.128 u[IU]/mL — ABNORMAL HIGH (ref 0.350–4.500)

## 2023-02-14 LAB — BRAIN NATRIURETIC PEPTIDE: B Natriuretic Peptide: 80.7 pg/mL (ref 0.0–100.0)

## 2023-02-14 LAB — CORTISOL: Cortisol, Plasma: 4.6 ug/dL

## 2023-02-14 LAB — MAGNESIUM: Magnesium: 1.8 mg/dL (ref 1.7–2.4)

## 2023-02-14 MED ORDER — TOLVAPTAN 15 MG PO TABS
15.0000 mg | ORAL_TABLET | Freq: Once | ORAL | Status: AC
  Administered 2023-02-14: 15 mg via ORAL
  Filled 2023-02-14: qty 1

## 2023-02-14 NOTE — Progress Notes (Signed)
PROGRESS NOTE        PATIENT DETAILS Name: Joseph Bell Age: 62 y.o. Sex: male Date of Birth: 01/26/1961 Admit Date: 02/06/2023 Admitting Physician Evalee Mutton Kristeen Mans, MD [id number], No Pcp Per  Brief Summary: Patient is a 62 y.o.  male with history of EtOH use, homelessness-presented to the ED with frequent falls, weakness-was found to have EtOH withdrawal, left> right leg cellulitis and numerous electrolyte abnormalities.  He was subsequently admitted to the [facility].  Significant events: 3/4>> admit to [facility]  Significant studies: 3/4>> CT angio chest: No PE, circumferential wall thickening of distal half of thoracic esophagus. 3/4>> CT abdomen/pelvis: No acute pathology. 3/4>> CT C-spine: No acute findings. 3/4>> x-ray right knee: No fracture 3/4>> x-ray right fibula/tibia: No fracture 3/4>> x-ray left fibula/tibia: No fracture 3/5>> MRI brain: No acute intracranial process  Significant microbiology data: 3/4>> COVID/influenza/RSV PCR: Negative 3/4>> respiratory virus panel: Negative 3/4>> blood culture: Negative  Procedures: None  Consults: None  Subjective: Patient in bed, appears comfortable, denies any headache, no fever, no chest pain or pressure, no shortness of breath , no abdominal pain. No focal weakness.  Objective: Vitals: Blood pressure 118/73, pulse 80, temperature 98.4 F (36.9 C), temperature source Oral, resp. rate 17, height '5\' 4"'$  (1.626 m), weight 71.6 kg, SpO2 94 %.   Exam:  Awake Alert, No new F.N deficits, Normal affect [place].AT,PERRAL Supple Neck, No JVD,   Symmetrical Chest wall movement, Good air movement bilaterally, CTAB RRR,No Gallops, Rubs or new Murmurs,  +ve B.Sounds, Abd Soft, No tenderness,   No Cyanosis, Clubbing or edema    Assessment/Plan:  Left> right leg cellulitis Significant improvement in erythema Leukocytosis has essentially resolved Initially on Rocephin has been switched  to doxycycline for a few more days. 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 Per SW-he will he hard to place due to lack of insurance  EtOH withdrawal No tremors Completely awake/alert today Completed Ativan per CIWA protocol Will continue to counsel but do not think he has any intention of quitting at this point.    Hypokalemia/phosphatemia Repleted  Hypomagnesemia/hypophosphatemia In the setting of EtOH use Continues to have persistent hypomagnesemia-repeat 4 g IV mag sulfate today-will also place on mag oxide orally x 2 days - monitor. Although likely secondary to EtOH-will stop PPI   Hyponatremia SIADH, placed on fluid restriction, Lasix on 02/12/2023, not much improved trial of Samsca on 02/14/2023. Prolonged QTc Suspect due to electrolyte abnormalities Repeat twelve-lead EKG on 3/6 with resolution of prolonged QTc.  Thrombocytopenia In the setting of EtOH use Mild-stable for periodic follow-up  Circumferential wall thickening of the distal half of the thoracic esophagus Incidental finding on CTA chest Continue PPI-although he is not overtly symptomatic. Will need outpatient follow-up with GI  Nodular opacity right upper lobe, pleural-based density left upper lobe Incidental finding on CT chest Radiology recommending repeat CT chest in 3 months  Bilateral pretibial lower extremity wounds Present prior to admission Wound care team following Continue dressing per wound care team  Sick euthyroid syndrome.  TSH mildly elevated but stable free T4.  Repeat by  PCP in 4 to 6 weeks.    Homelessness Lives in van-found covered with feces Per ED documentation Social worker eval  BMI: Estimated body mass index is 27.09 kg/m as calculated from the following:   Height as of this encounter: '5\' 4"'$  (1.626 m).   Weight as of this encounter: 71.6 kg.   Code status:   Code Status: Full Code   DVT Prophylaxis: SCDs Start: 02/07/23 2313 Place and maintain sequential compression device Start: 02/07/23 1345SCDs given thrombocytopenia   Family Communication: None at bedside   Disposition Plan: Status is: Observation The patient will require care spanning > 2 midnights and should be moved to inpatient because: Severity of illness   Planned Discharge Destination:Home versus SNF   Diet: Diet Order             Diet regular Room service appropriate? Yes with Assist; Fluid consistency: Thin  Diet effective now                    MEDICATIONS: Scheduled Meds:  folic acid  1 mg Oral Daily   guaiFENesin  600 mg Oral BID   multivitamin with minerals  1 tablet Oral Daily   nicotine  14 mg Transdermal Daily   nystatin   Topical TID   thiamine  100 mg Oral Daily   tolvaptan  15 mg Oral Once   Continuous Infusions:   PRN Meds:.acetaminophen **OR** acetaminophen, albuterol, mouth rinse, oxyCODONE-acetaminophen, Racepinephrine HCl   I have personally reviewed following labs and imaging studies  LABORATORY DATA:  Recent Labs  Lab 02/07/23 2357 02/08/23 0638 02/12/23 0336 02/13/23 0223 02/14/23 0332  WBC 13.8* 11.4* 7.0 6.8 7.9  HGB 10.9* 10.5* 13.4 13.4 12.9*  HCT 31.8* 31.0* 39.0 37.1* 36.7*  PLT 87* 85* 214 269 323  MCV 103.9* 105.4* 100.0 97.6 99.5  MCH 35.6* 35.7* 34.4* 35.3* 35.0*  MCHC 34.3 33.9 34.4 36.1* 35.1  RDW 13.0 13.0 11.9 11.9 11.9  LYMPHSABS  --   --  2.0 2.1 2.3  MONOABS  --   --  1.8* 1.8* 1.6*  EOSABS  --   --  0.4 0.3 0.3  BASOSABS  --   --  0.1 0.1 0.1    Recent Labs  Lab 02/08/23 [id number] 02/09/23 0555  02/10/23 0835 02/11/23 0338 02/12/23 0336 02/13/23 0223 02/14/23 0332  NA 134*   < > 131* 129* 129* 128* 130*  K 3.2*   < > 3.5 3.8 3.8 3.5 3.7  CL 102   < > 99 99 94* 95* 98  CO2 21*   < > 23 21* 29 25 21*  ANIONGAP 11   < > '9 9 6 8 11  '$ GLUCOSE 97   < > 94 96 102* 115* 93  BUN 10   < > <5* <5* '9 11 10  '$ CREATININE 0.67   < > 0.59* 0.66 0.74 0.72 0.61  AST 61*  --   --   --  28 32 28  ALT 41  --   --   --  '26 29 28  '$ ALKPHOS 63  --   --   --  78 84 74  BILITOT 0.6  --   --   --  0.8 0.7 0.6  ALBUMIN 2.4*  --   --   --  2.9* 2.7* 2.9*  TSH  --   --   --   --  11.926*  8.128*  --   BNP  --   --   --   --  88.6 89.3 80.7  MG 2.0   < > 1.5* 1.5* 1.7 1.6* 1.8  CALCIUM 7.7*   < > 8.6* 8.8* 8.9 8.7* 8.6*   < > = values in this interval not displayed.     RADIOLOGY STUDIES/RESULTS: No results found.   LOS: 7 days   Signature  -    Lala Lund M.D on 02/14/2023 at 9:31 AM   -  To page go to [email or web address]

## 2023-02-14 NOTE — Plan of Care (Signed)
  Problem: Fluid Volume: Goal: Hemodynamic stability will improve Outcome: Progressing   Problem: Clinical Measurements: Goal: Diagnostic test results will improve Outcome: Progressing Goal: Signs and symptoms of infection will decrease Outcome: Progressing   Problem: Respiratory: Goal: Ability to maintain adequate ventilation will improve Outcome: Progressing   Problem: Clinical Measurements: Goal: Ability to avoid or minimize complications of infection will improve Outcome: Progressing   Problem: Skin Integrity: Goal: Skin integrity will improve Outcome: Progressing   Problem: Education: Goal: Knowledge of General Education information will improve Description: Including pain rating scale, medication(s)/side effects and non-pharmacologic comfort measures Outcome: Progressing   Problem: Health Behavior/Discharge Planning: Goal: Ability to manage health-related needs will improve Outcome: Progressing   Problem: Clinical Measurements: Goal: Ability to maintain clinical measurements within normal limits will improve Outcome: Progressing Goal: Will remain free from infection Outcome: Progressing Goal: Diagnostic test results will improve Outcome: Progressing Goal: Respiratory complications will improve Outcome: Progressing Goal: Cardiovascular complication will be avoided Outcome: Progressing   Problem: Activity: Goal: Risk for activity intolerance will decrease Outcome: Progressing   Problem: Nutrition: Goal: Adequate nutrition will be maintained Outcome: Progressing   Problem: Coping: Goal: Level of anxiety will decrease Outcome: Progressing   Problem: Elimination: Goal: Will not experience complications related to bowel motility Outcome: Progressing Goal: Will not experience complications related to urinary retention Outcome: Progressing   Problem: Pain Managment: Goal: General experience of comfort will improve Outcome: Progressing   Problem:  Safety: Goal: Ability to remain free from injury will improve Outcome: Progressing   Problem: Skin Integrity: Goal: Risk for impaired skin integrity will decrease Outcome: Progressing   

## 2023-02-14 NOTE — Progress Notes (Signed)
Mobility Specialist - Progress Note   02/14/23 1504  Mobility  Activity Ambulated with assistance in hallway  Level of Assistance Contact guard assist, steadying assist  Assistive Device Front wheel walker  Distance Ambulated (ft) 240 ft  Activity Response Tolerated well  Mobility Referral Yes  $Mobility charge 1 Mobility   Pt was received in bed and agreeable to mobility. Pt c/o slight foot pain throughout session. Pt was returned to bed with all needs met.  Franki Monte  Mobility Specialist Please contact via Solicitor or Rehab office at [phone]

## 2023-02-14 NOTE — Progress Notes (Signed)
Occupational Therapy Treatment Patient Details Name: Joseph Bell MRN: [id number] DOB: [birth year] Today's Date: 02/14/2023   History of present illness Pt is a 62 y.o. male arriving via EMS after being found down and covered with stool in his [name]. Found to have SOB on exertion, RLS rhonchi bilaterally, and BLE cellulitis. CT head with no acute findings; CXR with diffuse interstitial prominence (pulmonary edema vs viral infection). PMH significant for alcohol abuse, homelessness, and subdural hematoma in Dec 2023.   OT comments  Patient demonstrated good progress this treatment session but required cues for encouragement to participate. Patient able to donn and doff socks seated on EOB, perform grooming standing, and transfers with RW and min guard assist. Acute OT to continue to follow to address grooming standing at sink, bathing/dressing, and toilet transfers.    Recommendations for follow up therapy are one component of a multi-disciplinary discharge planning process, led by the attending physician.  Recommendations may be updated based on patient status, additional functional criteria and insurance authorization.    Follow Up Recommendations  Skilled nursing-short term rehab (<3 hours/day)     Assistance Recommended at Discharge Frequent or constant Supervision/Assistance  Patient can return home with the following  Help with stairs or ramp for entrance;Assist for transportation;Direct supervision/assist for financial management;Direct supervision/assist for medications management;Assistance with cooking/housework;A little help with walking and/or transfers;A little help with bathing/dressing/bathroom   Equipment Recommendations  Other (comment) (defer)    Recommendations for Other Services      Precautions / Restrictions Precautions Precautions: Fall Precaution Comments: BLE wounds Restrictions Weight Bearing Restrictions: No       Mobility Bed Mobility Overal bed  mobility: Needs Assistance Bed Mobility: Supine to Sit, Sit to Supine     Supine to sit: HOB elevated, Supervision Sit to supine: HOB elevated, Supervision   General bed mobility comments: no physical assistance for bed mobility    Transfers Overall transfer level: Needs assistance Equipment used: Rolling walker (2 wheels) Transfers: Sit to/from Stand Sit to Stand: Supervision           General transfer comment: supervision to stand and min guard for mobility and transfers     Balance Overall balance assessment: Needs assistance Sitting-balance support: No upper extremity supported, Feet supported Sitting balance-Leahy Scale: Good     Standing balance support: Single extremity supported, Bilateral upper extremity supported, During functional activity Standing balance-Leahy Scale: Fair Standing balance comment: requires assistance from RW for balance                           ADL either performed or assessed with clinical judgement   ADL Overall ADL's : Needs assistance/impaired     Grooming: Wash/dry hands;Standing;Min guard               Lower Body Dressing: Supervision/safety;Sitting/lateral leans Lower Body Dressing Details (indicate cue type and reason): to doff and donn socks from EOB Toilet Transfer: Min guard;Ambulation;Rolling walker (2 wheels) Toilet Transfer Details (indicate cue type and reason): simulated           General ADL Comments: patient required mod verbal cues for encouragement on this date    Extremity/Trunk Assessment              Vision       Perception     Praxis      Cognition Arousal/Alertness: Awake/alert Behavior During Therapy: WFL for tasks assessed/performed Overall Cognitive Status: No family/caregiver present to determine  baseline cognitive functioning Area of Impairment: Safety/judgement, Following commands                       Following Commands: Follows one step commands  consistently Safety/Judgement: Decreased awareness of safety, Decreased awareness of deficits   Problem Solving: Requires verbal cues General Comments: required mod cues for encouragement        Exercises      Shoulder Instructions       General Comments      Pertinent Vitals/ Pain       Pain Assessment Pain Assessment: Faces Faces Pain Scale: Hurts little more Pain Location: BLE Pain Descriptors / Indicators: Aching Pain Intervention(s): Limited activity within patient's tolerance, Monitored during session, Repositioned  Home Living                                          Prior Functioning/Environment              Frequency  Min 2X/week        Progress Toward Goals  OT Goals(current goals can now be found in the care plan section)  Progress towards OT goals: Progressing toward goals  Acute Rehab OT Goals Patient Stated Goal: get better OT Goal Formulation: With patient Time For Goal Achievement: 02/21/23 Potential to Achieve Goals: Fair ADL Goals Pt Will Perform Grooming: with supervision;standing Pt Will Perform Upper Body Dressing: with modified independence;sitting Pt Will Perform Lower Body Dressing: with supervision;sit to/from stand Pt Will Transfer to Toilet: with supervision;ambulating;regular height toilet  Plan Discharge plan remains appropriate    Co-evaluation                 AM-PAC OT "6 Clicks" Daily Activity     Outcome Measure   Help from another person eating meals?: None Help from another person taking care of personal grooming?: A Little Help from another person toileting, which includes using toliet, bedpan, or urinal?: A Little Help from another person bathing (including washing, rinsing, drying)?: A Little Help from another person to put on and taking off regular upper body clothing?: A Little Help from another person to put on and taking off regular lower body clothing?: A Little 6 Click Score:  19    End of Session Equipment Utilized During Treatment: Rolling walker (2 wheels)  OT Visit Diagnosis: Unsteadiness on feet (R26.81);Muscle weakness (generalized) (M62.81);Repeated falls (R29.6);History of falling (Z91.81);Other symptoms and signs involving cognitive function   Activity Tolerance Patient tolerated treatment well   Patient Left in bed;with call bell/phone within reach;with bed alarm set   Nurse Communication Mobility status        Time: [id number] OT Time Calculation (min): 12 min  Charges: OT General Charges $OT Visit: 1 Visit OT Treatments $Self Care/Home Management : 8-22 mins  Lodema Hong, [place]  Office [place] 02/14/2023, 10:26 AM

## 2023-02-15 ENCOUNTER — Inpatient Hospital Stay (HOSPITAL_COMMUNITY): Payer: Self-pay

## 2023-02-15 LAB — COMPREHENSIVE METABOLIC PANEL
ALT: 26 U/L (ref 0–44)
AST: 28 U/L (ref 15–41)
Albumin: 2.9 g/dL — ABNORMAL LOW (ref 3.5–5.0)
Alkaline Phosphatase: 81 U/L (ref 38–126)
Anion gap: 8 (ref 5–15)
BUN: 11 mg/dL (ref 8–23)
CO2: 23 mmol/L (ref 22–32)
Calcium: 8.9 mg/dL (ref 8.9–10.3)
Chloride: 105 mmol/L (ref 98–111)
Creatinine, Ser: 0.71 mg/dL (ref 0.61–1.24)
GFR, Estimated: >60 mL/min (ref >60)
Glucose, Bld: 101 mg/dL — ABNORMAL HIGH (ref 70–99)
Potassium: 3.6 mmol/L (ref 3.5–5.1)
Sodium: 136 mmol/L (ref 135–145)
Total Bilirubin: 0.3 mg/dL (ref 0.3–1.2)
Total Protein: 6.8 g/dL (ref 6.5–8.1)

## 2023-02-15 LAB — CBC WITH DIFFERENTIAL/PLATELET
Abs Immature Granulocytes: 0.06 10*3/uL (ref 0.00–0.07)
Basophils Absolute: 0.1 10*3/uL (ref 0.0–0.1)
Basophils Relative: 1 %
Eosinophils Absolute: 0.2 10*3/uL (ref 0.0–0.5)
Eosinophils Relative: 3 %
HCT: 37 % — ABNORMAL LOW (ref 39.0–52.0)
Hemoglobin: 13.1 g/dL (ref 13.0–17.0)
Immature Granulocytes: 1 %
Lymphocytes Relative: 24 %
Lymphs Abs: 1.8 10*3/uL (ref 0.7–4.0)
MCH: 35.5 pg — ABNORMAL HIGH (ref 26.0–34.0)
MCHC: 35.4 g/dL (ref 30.0–36.0)
MCV: 100.3 fL — ABNORMAL HIGH (ref 80.0–100.0)
Monocytes Absolute: 1.3 10*3/uL — ABNORMAL HIGH (ref 0.1–1.0)
Monocytes Relative: 18 %
Neutro Abs: 4 10*3/uL (ref 1.7–7.7)
Neutrophils Relative %: 53 %
Platelets: 384 10*3/uL (ref 150–400)
RBC: 3.69 MIL/uL — ABNORMAL LOW (ref 4.22–5.81)
RDW: 11.9 % (ref 11.5–15.5)
WBC: 7.5 10*3/uL (ref 4.0–10.5)
nRBC: 0 % (ref 0.0–0.2)

## 2023-02-15 LAB — C-REACTIVE PROTEIN: CRP: 0.8 mg/dL (ref <1.0)

## 2023-02-15 LAB — MAGNESIUM: Magnesium: 1.7 mg/dL (ref 1.7–2.4)

## 2023-02-15 LAB — PROCALCITONIN: Procalcitonin: <0.1 ng/mL

## 2023-02-15 LAB — BRAIN NATRIURETIC PEPTIDE: B Natriuretic Peptide: 140.7 pg/mL — ABNORMAL HIGH (ref 0.0–100.0)

## 2023-02-15 MED ORDER — MAGNESIUM SULFATE 2 GM/50ML IV SOLN
2.0000 g | Freq: Once | INTRAVENOUS | Status: AC
  Administered 2023-02-15: 2 g via INTRAVENOUS
  Filled 2023-02-15: qty 50

## 2023-02-15 MED ORDER — GABAPENTIN 300 MG PO CAPS
300.0000 mg | ORAL_CAPSULE | Freq: Two times a day (BID) | ORAL | Status: DC
  Administered 2023-02-15 – 2023-02-16 (×3): 300 mg via ORAL
  Filled 2023-02-15 (×3): qty 1

## 2023-02-15 MED ORDER — POTASSIUM CHLORIDE CRYS ER 20 MEQ PO TBCR
40.0000 meq | EXTENDED_RELEASE_TABLET | Freq: Once | ORAL | Status: AC
  Administered 2023-02-15: 40 meq via ORAL
  Filled 2023-02-15: qty 2

## 2023-02-15 NOTE — Progress Notes (Signed)
TOC continuing to follow for medical progression and ability to walk.   Gilmore Laroche, MSW, [facility]

## 2023-02-15 NOTE — TOC Initial Note (Signed)
Transition of Care [facility]) - Initial/Assessment Note    Patient Details  Name: Joseph Bell MRN: [id number] Date of Birth: [birth year]  Transition of Care [facility]) CM/SW Contact:    Benard Halsted, LCSW Phone Number: 02/15/2023, 9:05 AM  Clinical Narrative:                 CSW spoke with patient regarding his discharge plan. He stated he supposed he will return to his [name] but would like to contact Rema Jasmine with [facility] [phone], [address], [place], [place] [address]). CSW located contact information online for patient. CSW can provide taxi voucher if patient has an address to provide. Patient accepted community resources. Will follow up on medication needs.   Expected Discharge Plan: Home/Self Care Barriers to Discharge: Barriers Resolved   Patient Goals and CMS Choice Patient states their goals for this hospitalization and ongoing recovery are:: Feel better          Expected Discharge Plan and Services In-house Referral: Clinical Social Work     Living arrangements for the past 2 months: Homeless (His [place])                                      Prior Living Arrangements/Services Living arrangements for the past 2 months: Homeless (His [place]) Lives with:: Self Patient language and need for interpreter reviewed:: Yes Do you feel safe going back to the place where you live?: Yes      Need for Family Participation in Patient Care: No (Comment) Care giver support system in place?: No (comment)   Criminal Activity/Legal Involvement Pertinent to Current Situation/Hospitalization: No - Comment as needed  Activities of Daily Living Home Assistive Devices/Equipment: None ADL Screening (condition at time of admission) Patient's cognitive ability adequate to safely complete daily activities?: No Is the patient deaf or have difficulty hearing?: Yes Does the patient have difficulty seeing, even when wearing glasses/contacts?: No Does the patient  have difficulty concentrating, remembering, or making decisions?: Yes Patient able to express need for assistance with ADLs?: No Does the patient have difficulty dressing or bathing?: Yes Independently performs ADLs?: No Does the patient have difficulty walking or climbing stairs?: Yes Weakness of Legs: Both Weakness of Arms/Hands: Both  Permission Sought/Granted                  Emotional Assessment Appearance:: Appears stated age Attitude/Demeanor/Rapport: Engaged Affect (typically observed): Accepting, Appropriate Orientation: : Oriented to Self, Oriented to Place, Oriented to  Time, Oriented to Situation Alcohol / Substance Use: Alcohol Use Psych Involvement: No (comment)  Admission diagnosis:  Hypokalemia [E87.6] Hypomagnesemia [E83.42] Hypophosphatasia [E83.39] Bilateral cellulitis of lower leg [L03.116, L03.115] Alcohol withdrawal syndrome with complication ([place]) [id number] Cellulitis [L03.90] Patient Active Problem List   Diagnosis Date Noted   Cellulitis 02/07/2023   Bilateral cellulitis of lower leg 02/06/2023   Prolonged QT interval 02/06/2023   Alcohol abuse 02/06/2023   Hypokalemia 02/06/2023   Hypomagnesemia 02/06/2023   Hypophosphatemia 02/06/2023   Protein calorie malnutrition ([place]) 02/06/2023   Debility 02/06/2023   Pancytopenia ([place]) 02/06/2023   Homelessness 02/06/2023   Wounds and injuries 02/06/2023   Superficial bruising of abdominal wall 02/06/2023   Fall at home, initial encounter 02/06/2023   PCP:  Patient, No Pcp Per Pharmacy:   [facility] [address] [place] [address] Phone: [phone] Fax: [phone]  Social Determinants of Health (SDOH) Social History:   SDOH Interventions:     Readmission Risk Interventions     No data to display

## 2023-02-15 NOTE — Progress Notes (Signed)
PROGRESS NOTE        PATIENT DETAILS Name: Joseph Bell Age: 62 y.o. Sex: male Date of Birth: 1961-04-14 Admit Date: 02/06/2023 Admitting Physician Evalee Mutton Kristeen Mans, MD [id number], No Pcp Per  Brief Summary: Patient is a 62 y.o.  male with history of EtOH use, homelessness-presented to the ED with frequent falls, weakness-was found to have EtOH withdrawal, left> right leg cellulitis and numerous electrolyte abnormalities.  He was subsequently admitted to the [facility] hospitalist service.  Significant events: 3/4>> admit to [facility]  Significant studies: 3/4>> CT angio chest: No PE, circumferential wall thickening of distal half of thoracic esophagus. 3/4>> CT abdomen/pelvis: No acute pathology. 3/4>> CT C-spine: No acute findings. 3/4>> x-ray right knee: No fracture 3/4>> x-ray right fibula/tibia: No fracture 3/4>> x-ray left fibula/tibia: No fracture 3/5>> MRI brain: No acute intracranial process  Significant microbiology data: 3/4>> COVID/influenza/RSV PCR: Negative 3/4>> respiratory virus panel: Negative 3/4>> blood culture: Negative  Procedures: None  Consults: None  Subjective: Patient in bed, appears comfortable, denies any headache, no fever, no chest pain or pressure, no shortness of breath , no abdominal pain. No focal weakness.  Objective: Vitals: Blood pressure 131/67, pulse 84, temperature 99.2 F (37.3 C), temperature source Oral, resp. rate 18, height '5\' 4"'$  (1.626 m), weight 71.6 kg, SpO2 97 %.   Exam:  Awake Alert, No new F.N deficits, Normal affect [place].AT,PERRAL Supple Neck, No JVD,   Symmetrical Chest wall movement, Good air movement bilaterally, CTAB RRR,No Gallops, Rubs or new Murmurs,  +ve B.Sounds, Abd Soft, No tenderness,   No Cyanosis, Clubbing or edema    Assessment/Plan:  Left> right leg cellulitis Significant improvement in erythema Leukocytosis has essentially resolved Initially on Rocephin has been switched  to doxycycline for a few more days. 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 Per SW-he will he hard to place due to lack of insurance  EtOH withdrawal No tremors Completely awake/alert today Completed Ativan per CIWA protocol Will continue to counsel but do not think he has any intention of quitting at this point.    Hypokalemia/phosphatemia Repleted  Hypomagnesemia/hypophosphatemia In the setting of EtOH use Continues to have persistent hypomagnesemia-repeat 4 g IV mag sulfate today-will also place on mag oxide orally x 2 days - monitor. Although likely secondary to EtOH-will stop PPI   Hyponatremia SIADH, resolved after [place].  Intermittently monitor.  Replaced on fluid restriction.   Prolonged QTc Suspect due to electrolyte abnormalities Repeat twelve-lead EKG on 3/6 with resolution of prolonged QTc.  Thrombocytopenia In the setting of EtOH use Mild-stable for periodic follow-up  Circumferential wall thickening of the distal half of the thoracic esophagus Incidental finding on CTA chest Continue PPI-although he is not overtly symptomatic. Will need outpatient follow-up with GI  Nodular opacity right upper lobe, pleural-based density left upper lobe Incidental finding on CT chest Radiology recommending repeat CT chest in 3 months  Bilateral pretibial lower extremity wounds Present prior to admission Wound care team following Continue dressing per wound care team  Sick euthyroid syndrome.  TSH mildly  elevated but stable free T4.  Repeat by PCP in 4 to  6 weeks.    Homelessness Lives in van-found covered with feces Per ED documentation Social worker eval  BMI: Estimated body mass index is 27.09 kg/m as calculated from the following:   Height as of this encounter: '5\' 4"'$  (1.626 m).   Weight as of this encounter: 71.6 kg.   Code status:   Code Status: Full Code   DVT Prophylaxis: SCDs Start: 02/07/23 2313 Place and maintain sequential compression device Start: 02/07/23 1345SCDs given thrombocytopenia   Family Communication: None at bedside   Disposition Plan: Status is: Observation The patient will require care spanning > 2 midnights and should be moved to inpatient because: Severity of illness   Planned Discharge Destination:Home versus SNF   Diet: Diet Order             Diet regular Room service appropriate? Yes with Assist; Fluid consistency: Thin  Diet effective now                    MEDICATIONS: Scheduled Meds:  folic acid  1 mg Oral Daily   guaiFENesin  600 mg Oral BID   multivitamin with minerals  1 tablet Oral Daily   nicotine  14 mg Transdermal Daily   nystatin   Topical TID   thiamine  100 mg Oral Daily   Continuous Infusions:   PRN Meds:.acetaminophen **OR** acetaminophen, albuterol, mouth rinse, oxyCODONE-acetaminophen, Racepinephrine HCl   I have personally reviewed following labs and imaging studies  LABORATORY DATA:  Recent Labs  Lab 02/12/23 0336 02/13/23 0223 02/14/23 0332 02/15/23 0311  WBC 7.0 6.8 7.9 7.5  HGB 13.4 13.4 12.9* 13.1  HCT 39.0 37.1* 36.7* 37.0*  PLT 214 269 323 384  MCV 100.0 97.6 99.5 100.3*  MCH 34.4* 35.3* 35.0* 35.5*  MCHC 34.4 36.1* 35.1 35.4  RDW 11.9 11.9 11.9 11.9  LYMPHSABS 2.0 2.1 2.3 1.8  MONOABS 1.8* 1.8* 1.6* 1.3*  EOSABS 0.4 0.3 0.3 0.2  BASOSABS 0.1 0.1 0.1 0.1    Recent Labs  Lab 02/11/23 0338 02/12/23 0336 02/13/23 0223 02/14/23 0332 02/15/23 0311  NA 129* 129* 128* 130* 136  K 3.8 3.8 3.5 3.7 3.6  CL 99 94* 95* 98 105  CO2 21*  29 25 21* 23  ANIONGAP '9 6 8 11 8  '$ GLUCOSE 96 102* 115* 93 101*  BUN <5* '9 11 10 11  '$ CREATININE 0.66 0.74 0.72 0.61 0.71  AST  --  28 32 28 28  ALT  --  '26 29 28 26  '$ ALKPHOS  --  78 84 74 81  BILITOT  --  0.8 0.7 0.6 0.3  ALBUMIN  --  2.9* 2.7* 2.9* 2.9*  TSH  --  11.926* 8.128*  --   --   BNP  --  88.6 89.3 80.7 140.7*  MG 1.5* 1.7 1.6* 1.8 1.7  CALCIUM 8.8* 8.9 8.7* 8.6* 8.9     RADIOLOGY STUDIES/RESULTS: No results found.   LOS: 8 days   Signature  -    Lala Lund M.D on 02/15/2023 at 9:23 AM   -  To page go to [email or web address]

## 2023-02-15 NOTE — Progress Notes (Signed)
Patient continues to improve with mobility.   Gilmore Laroche, MSW, [facility]

## 2023-02-15 NOTE — Progress Notes (Signed)
Physical Therapy Treatment Patient Details Name: Joseph Bell MRN: [id number] DOB: 08-13-1961 Today's Date: 02/15/2023   History of Present Illness Pt is a 62 y.o. male arriving via EMS after being found down and covered with stool in his [name]. Found to have SOB on exertion, RLS rhonchi bilaterally, and BLE cellulitis. CT head with no acute findings; CXR with diffuse interstitial prominence (pulmonary edema vs viral infection). PMH significant for alcohol abuse, homelessness, and subdural hematoma in Dec 2023.    PT Comments    Pt tolerated today's session well, progressing with ambulation but continuing to require minG for safety as pt can be impulsive. Pt reports pain in L foot, attempting ambulation without AD but limited by the pain. Attempted to get pt to perform higher level balance with gait with head turns but pt minimally participating. Able to perform 180* to 360* turns, minG provided and increased time but no LOB. Able to stand with feet apart and eyes closed without sway, holding balance with perturbations in static standing. Acute PT will continue to follow to progress safety with mobility.    Recommendations for follow up therapy are one component of a multi-disciplinary discharge planning process, led by the attending physician.  Recommendations may be updated based on patient status, additional functional criteria and insurance authorization.  Follow Up Recommendations  No PT follow up Can patient physically be transported by private vehicle: Yes   Assistance Recommended at Discharge Intermittent Supervision/Assistance  Patient can return home with the following Help with stairs or ramp for entrance;Assist for transportation;A little help with walking and/or transfers   Equipment Recommendations  None recommended by PT    Recommendations for Other Services       Precautions / Restrictions Precautions Precautions: Fall Precaution Comments: BLE  wounds Restrictions Weight Bearing Restrictions: No     Mobility  Bed Mobility               General bed mobility comments: pt seated EOB upon arrival    Transfers Overall transfer level: Needs assistance Equipment used: Rolling walker (2 wheels) Transfers: Sit to/from Stand Sit to Stand: Supervision           General transfer comment: supervision to stand with RW, cued for hand placement    Ambulation/Gait Ambulation/Gait assistance: Min guard Gait Distance (Feet): 350 Feet Assistive device: Rolling walker (2 wheels) Gait Pattern/deviations: Step-through pattern, Decreased stride length, Wide base of support, Drifts right/left Gait velocity: WFL     General Gait Details: cueing for proximity to RW and upright posture with engaging core, RW lowered for more appropriate fit. Brief trial without RW but pt limited by pain in LLE noting decreased gait speed and antalgic gait. Pt with minG for safety due to impulsivity.   Stairs             Wheelchair Mobility    Modified Rankin (Stroke Patients Only)       Balance Overall balance assessment: Needs assistance Sitting-balance support: No upper extremity supported, Feet supported Sitting balance-Leahy Scale: Good     Standing balance support: Bilateral upper extremity supported, During functional activity, No upper extremity supported Standing balance-Leahy Scale: Fair Standing balance comment: relies on RW but able to progress briefly to no AD trial and standing statically without UE support                            Cognition Arousal/Alertness: Awake/alert Behavior During Therapy: [facility] for tasks  assessed/performed, Impulsive Overall Cognitive Status: No family/caregiver present to determine baseline cognitive functioning Area of Impairment: Safety/judgement, Problem solving                         Safety/Judgement: Decreased awareness of safety, Decreased awareness of  deficits   Problem Solving: Requires verbal cues General Comments: cues provided for safety as pt is intermittently impulsive        Exercises      General Comments General comments (skin integrity, edema, etc.): VSS on room air      Pertinent Vitals/Pain Pain Assessment Pain Assessment: Faces Faces Pain Scale: Hurts little more Pain Location:  (L foot) Pain Descriptors / Indicators: Aching Pain Intervention(s): Limited activity within patient's tolerance, Monitored during session, Repositioned    Home Living                          Prior Function            PT Goals (current goals can now be found in the care plan section) Acute Rehab PT Goals Patient Stated Goal: get better PT Goal Formulation: With patient Time For Goal Achievement: 02/21/23 Potential to Achieve Goals: Good Progress towards PT goals: Progressing toward goals    Frequency    Min 3X/week      PT Plan Current plan remains appropriate    Co-evaluation              AM-PAC PT "6 Clicks" Mobility   Outcome Measure  Help needed turning from your back to your side while in a flat bed without using bedrails?: A Little Help needed moving from lying on your back to sitting on the side of a flat bed without using bedrails?: A Little Help needed moving to and from a bed to a chair (including a wheelchair)?: A Little Help needed standing up from a chair using your arms (e.g., wheelchair or bedside chair)?: A Little Help needed to walk in hospital room?: A Little Help needed climbing 3-5 steps with a railing? : A Lot 6 Click Score: 17    End of Session Equipment Utilized During Treatment: Gait belt Activity Tolerance: Patient tolerated treatment well Patient left: in bed;with call bell/phone within reach Nurse Communication: Mobility status PT Visit Diagnosis: Repeated falls (R29.6);Muscle weakness (generalized) (M62.81);Difficulty in walking, not elsewhere classified (R26.2)      Time: [id number] PT Time Calculation (min) (ACUTE ONLY): 18 min  Charges:  $Gait Training: 8-22 mins                     [name], PT DPT Acute Rehabilitation Services Office [phone]    Luvenia Heller 02/15/2023, 3:49 PM

## 2023-02-16 ENCOUNTER — Other Ambulatory Visit (HOSPITAL_COMMUNITY): Payer: Self-pay

## 2023-02-16 MED ORDER — ADULT MULTIVITAMIN W/MINERALS CH
1.0000 | ORAL_TABLET | Freq: Every day | ORAL | 0 refills | Status: AC
  Filled 2023-02-16: qty 30, 30d supply, fill #0

## 2023-02-16 MED ORDER — FOLIC ACID 1 MG PO TABS
1.0000 mg | ORAL_TABLET | Freq: Every day | ORAL | 0 refills | Status: AC
  Filled 2023-02-16: qty 30, 30d supply, fill #0

## 2023-02-16 MED ORDER — GABAPENTIN 300 MG PO CAPS
300.0000 mg | ORAL_CAPSULE | Freq: Two times a day (BID) | ORAL | 0 refills | Status: AC
  Filled 2023-02-16: qty 60, 30d supply, fill #0

## 2023-02-16 MED ORDER — NICOTINE 14 MG/24HR TD PT24
14.0000 mg | MEDICATED_PATCH | Freq: Every day | TRANSDERMAL | 0 refills | Status: AC
  Filled 2023-02-16: qty 28, 28d supply, fill #0

## 2023-02-16 MED ORDER — ACETAMINOPHEN 325 MG PO TABS
650.0000 mg | ORAL_TABLET | Freq: Four times a day (QID) | ORAL | 0 refills | Status: AC | PRN
  Filled 2023-02-16: qty 20, 3d supply, fill #0

## 2023-02-16 MED ORDER — THIAMINE HCL 100 MG PO TABS
100.0000 mg | ORAL_TABLET | Freq: Every day | ORAL | 0 refills | Status: AC
  Filled 2023-02-16: qty 30, 30d supply, fill #0

## 2023-02-16 MED ORDER — PANTOPRAZOLE SODIUM 40 MG PO TBEC
40.0000 mg | DELAYED_RELEASE_TABLET | Freq: Every day | ORAL | 0 refills | Status: AC
  Filled 2023-02-16: qty 30, 30d supply, fill #0

## 2023-02-16 NOTE — TOC Transition Note (Signed)
Transition of Care [facility]) - CM/SW Discharge Note   Patient Details  Name: Joseph Bell MRN: [id number] Date of Birth: Jun 18, 1961  [facility]) CM/SW Contact:  Benard Halsted, LCSW Phone Number: 02/16/2023, 1:57 PM   Clinical Narrative:    Patient discharging to the [facility]. Taxi voucher and bus passes placed on hard chart for discharge with transport waiver.    Final next level of care: Home/Self Care Barriers to Discharge: Barriers Resolved   Patient Goals and CMS Choice      Discharge Placement                         Discharge Plan and Services Additional resources added to the After Visit Summary for   In-house Referral: Clinical Social Work                                   Social Determinants of Health (SDOH) Interventions     Readmission Risk Interventions     No data to display

## 2023-02-16 NOTE — Discharge Summary (Signed)
Joseph Bell [id number] DOB: 12/09/1960 DOA: 02/06/2023  PCP: Patient, No Pcp Per  Admit date: 02/06/2023  Discharge date: 02/16/2023  Admitted From: Home   Disposition:  Home   Recommendations for Outpatient Follow-up:   Follow up with PCP in 1-2 weeks  PCP Please obtain BMP/CBC, 2 view CXR in 1week,  (see Discharge instructions)   PCP Please follow up on the following pending results: Needs repeat CT chest in 3 months needs outpatient pulmonary follow-up, check CBC, BMP, magnesium and TSH and 2 to 3 weeks.  Outpatient EGD, currently arrange for outpatient GI follow-up along with pulmonary follow-up.   Home Health: None   Equipment/Devices: None  Consultations: None  Discharge Condition: Stable    CODE STATUS: Full    Diet Recommendation: Heart Healthy     Chief Complaint  Patient presents with   Shortness of Breath   Diarrhea     Brief history of present illness from the day of admission and additional interim summary    62 y.o.  male with history of EtOH use, homelessness-presented to the ED with frequent falls, weakness-was found to have EtOH withdrawal, left> right leg cellulitis and numerous electrolyte abnormalities.  He was subsequently admitted to the [facility] hospital[facility].   Significant events: 3/4>> admit to [facility]   Significant studies: 3/4>> CT angio chest: No PE, circumferential wall thickening of distal half of thoracic esophagus. 3/4>> CT abdomen/pelvis: No acute pathology. 3/4>> CT C-spine: No acute findings. 3/4>> x-ray right knee: No fracture 3/4>> x-ray right fibula/tibia: No fracture 3/4>> x-ray left fibula/tibia: No fracture 3/5>> MRI brain: No acute intracranial process   Significant microbiology data: 3/4>> COVID/influenza/RSV PCR: Negative 3/4>> respiratory virus  panel: Negative 3/4>> blood culture: Negative                                                                 Hospital Course   Left> right leg cellulitis Significant improvement in erythema Leukocytosis has essentially resolved Now finished antibiotic treatment. 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      Hyponatremia SIADH, resolved after [place] on 02/14/2023.  Prolonged QTc Suspect due to electrolyte abnormalities Repeat twelve-lead EKG on 3/6 with resolution of prolonged QTc.   Thrombocytopenia In the setting of EtOH use Mild-stable for periodic follow-up   Circumferential wall thickening of the distal half of the thoracic esophagus Incidental finding on CTA chest Continue PPI-although he is not  overtly symptomatic. Will need outpatient follow-up with GI   Nodular opacity right upper lobe, pleural-based density left upper lobe Incidental finding on CT chest Radiology recommending repeat CT chest in 3 months   Bilateral pretibial lower extremity wounds Present prior to admission Wound care team following Continue dressing per wound care team   Sick euthyroid syndrome.  TSH mildly elevated but stable free T4.  Repeat by PCP in 4 to 6 weeks.       Discharge diagnosis     Principal Problem:   Bilateral cellulitis of lower leg Active Problems:   Prolonged QT interval   Alcohol abuse   Hypokalemia   Hypomagnesemia   Hypophosphatemia   Protein calorie malnutrition (HCC)   Debility   Pancytopenia (HCC)   Homelessness    Wounds and injuries   Superficial bruising of abdominal wall   Fall at home, initial encounter   Cellulitis    Discharge instructions    Discharge Instructions     Diet - low sodium heart healthy   Complete by: As directed    Discharge instructions   Complete by: As directed    Follow with Primary MD in 7 days   Get CBC, CMP, Magnesium, 2 view Chest X ray -  checked next visit with your primary MD   Activity: As tolerated with Full fall precautions use walker/cane & assistance as needed  Disposition Home   Diet: Heart Healthy   Special Instructions: If you have smoked or chewed Tobacco  in the last 2 yrs please stop smoking, stop any regular Alcohol  and or any Recreational drug use.  On your next visit with your primary care physician please Get Medicines reviewed and adjusted.  Please request your Prim.MD to go over all Hospital Tests and Procedure/Radiological results at the follow up, please get all Hospital records sent to your Prim MD by signing hospital release before you go home.  If you experience worsening of your admission symptoms, develop shortness of breath, life threatening emergency, suicidal or homicidal thoughts you must seek medical attention immediately by calling 911 or calling your MD immediately  if symptoms less severe.  You Must read complete instructions/literature along with all the possible adverse reactions/side effects for all the Medicines you take and that have been prescribed to you. Take any new Medicines after you have completely understood and accpet all the possible adverse reactions/side effects.   Discharge wound care:   Complete by: As directed    Wound care to bilateral LE lesions, including right knee skin tear:  Cleanse with NS, pat dry., Cover with folded layers of xeroform gauze, top pretibial lesions with ABD pad, top right knee with dry gauze. Secure pretibial dressings with Kerlix roll gauze/paper tape. Secure right knee dressing  with silicone foam. Change daily.   Increase activity slowly   Complete by: As directed        Discharge Medications   Allergies as of 02/16/2023   No Known Allergies      Medication List     TAKE these medications    acetaminophen 325 MG tablet Commonly known as: TYLENOL Take 2 tablets (650 mg total) by mouth every 6 (six) hours as needed for mild pain, fever or headache.   folic acid 1 MG tablet Commonly known as: FOLVITE Take 1 tablet (1 mg total) by mouth daily. Start taking on: February 17, 2023   gabapentin 300 MG capsule Commonly known as: NEURONTIN Take 1 capsule (300 mg total) by mouth  2 (two) times daily.   multivitamin with minerals Tabs tablet Take 1 tablet by mouth daily. Start taking on: February 17, 2023   nicotine 14 mg/24hr patch Commonly known as: NICODERM CQ - dosed in mg/24 hours Place 1 patch (14 mg total) onto the skin daily. Start taking on: February 17, 2023   pantoprazole 40 MG tablet Commonly known as: Protonix Take 1 tablet (40 mg total) by mouth daily.   thiamine 100 MG tablet Commonly known as: Vitamin B-1 Take 1 tablet (100 mg total) by mouth daily. Start taking on: February 17, 2023               Discharge Care Instructions  (From admission, onward)           Start     Ordered   02/16/23 0000  Discharge wound care:       Comments: Wound care to bilateral LE lesions, including right knee skin tear:  Cleanse with NS, pat dry., Cover with folded layers of xeroform gauze, top pretibial lesions with ABD pad, top right knee with dry gauze. Secure pretibial dressings with Kerlix roll gauze/paper tape. Secure right knee dressing with silicone foam. Change daily.   02/16/23 1045             Follow-up Information     [facility]. Schedule an appointment as soon as possible for a visit in 1 week(s).   Contact information: [address] [place]  [phone]                Major procedures and Radiology Reports - PLEASE review detailed and final reports thoroughly  -        DG Foot Complete Left  Result Date: 02/15/2023 CLINICAL DATA:  Left foot pain. EXAM: LEFT FOOT - COMPLETE 3+ VIEW COMPARISON:  None Available. FINDINGS: Significant/severe degenerative changes at the first MTP joint with marked joint space narrowing, osteophytic spurring and widening. No definite erosive changes. Mild degenerative changes at the interphalangeal joints and also at the other MTP joints. 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 The midfoot joint spaces are maintained. No acute bony findings are identified. IMPRESSION: 1. 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 2. No acute bony findings. Electronically Signed   By: Marijo Sanes M.D.   On: 02/15/2023 11:53   DG Chest Port 1 View  Result Date: 02/15/2023 CLINICAL DATA:  Shortness of breath EXAM: PORTABLE CHEST 1 VIEW COMPARISON:  CXR 02/05/23. FINDINGS: No pleural effusion. No pneumothorax. No focal airspace opacity. Unchanged bilateral prominent interstitial opacities. Unchanged cardiac and mediastinal contours. No radiographically apparent displaced rib fractures. Visualized upper abdomen is unremarkable. IMPRESSION: No focal airspace opacity Electronically Signed   By: Marin Roberts M.D.   On: 02/15/2023 11:38   VAS [place] ABI WITH/WO TBI  Result Date: 02/07/2023  LOWER EXTREMITY DOPPLER STUDY Patient Name:  KNOX GANTNER  Date of Exam:   02/07/2023 Medical Rec #: [id number]           Accession #:    [id number] Date of Birth: 09/03/1961            Patient Gender: M Patient Age:   62 years Exam Location:  [facility] Procedure:      VAS [place] ABI WITH/WO TBI Referring Phys: Nyoka Lint DOUTOVA --------------------------------------------------------------------------------  Indications: Pain (BLE cellulitis) High Risk Factors: Current  smoker.  Comparison Study: No previous exams  Performing Technologist: Rogelia Rohrer RVT, RDMS  Examination Guidelines: A complete evaluation includes at minimum, Doppler waveform signals and systolic blood pressure reading at the level of bilateral brachial, anterior tibial, and posterior tibial arteries, when vessel segments are accessible. Bilateral testing is considered an integral part of a complete examination. Photoelectric Plethysmograph (PPG) waveforms and toe systolic pressure readings are included as required and additional duplex testing as needed. Limited examinations for reoccurring indications may be performed as noted.  ABI Findings: +---------+------------------+-----+---------+--------+ Right    Rt Pressure (mmHg)IndexWaveform Comment  +---------+------------------+-----+---------+--------+ Brachial 103                    triphasic         +---------+------------------+-----+---------+--------+ PTA      115               1.12 triphasic         +---------+------------------+-----+---------+--------+ DP       104               1.01 triphasic         +---------+------------------+-----+---------+--------+ Great Toe78                0.76 Normal            +---------+------------------+-----+---------+--------+ +---------+------------------+-----+---------+-------+ Left     Lt Pressure (mmHg)IndexWaveform Comment +---------+------------------+-----+---------+-------+ Brachial 99                     triphasic        +---------+------------------+-----+---------+-------+ PTA      116               1.13 triphasic        +---------+------------------+-----+---------+-------+ DP       102               0.99 biphasic         +---------+------------------+-----+---------+-------+ Great Toe64                0.62 Normal           +---------+------------------+-----+---------+-------+  Summary: Right: Resting right ankle-brachial index is within normal  range. The right toe-brachial index is normal. Left: Resting left ankle-brachial index is within normal range. The left toe-brachial index is mildly abnormal. *See table(s) above for measurements and observations.  Electronically signed by Harold Barban MD on 02/07/2023 at 10:09:29 PM.    Final    MR BRAIN WO CONTRAST  Result Date: 02/07/2023 CLINICAL DATA:  Right lower extremity weakness and numbness for several days EXAM: MRI HEAD WITHOUT CONTRAST TECHNIQUE: Multiplanar, multiecho pulse sequences of the brain and surrounding structures were obtained without intravenous contrast. COMPARISON:  No prior MRI brain available, correlation is made with CT head 02/06/2023 FINDINGS: Brain: No restricted diffusion to suggest acute or subacute infarct. No acute hemorrhage, mass, mass effect, or midline shift. No hydrocephalus or extra-axial collection. Normal pituitary and craniocervical junction. No hemosiderin deposition to suggest remote hemorrhage. Advanced cerebral atrophy for age. Vascular: Normal arterial flow voids. Skull and upper cervical spine: Normal marrow signal. Sinuses/Orbits: Mucosal thickening in the right maxillary sinus and anterior ethmoid air cells. No acute finding in the orbits. Other: Fluid throughout bilateral mastoid air cells. IMPRESSION: 1. No acute intracranial process. 2. Advanced cerebral atrophy for age. Electronically Signed   By: Merilyn Baba M.D.   On: 02/07/2023 02:25   CT ABDOMEN PELVIS W CONTRAST  Result Date: 02/06/2023 CLINICAL DATA:  Blunt abdominal trauma. EXAM: CT ABDOMEN AND  PELVIS WITH CONTRAST TECHNIQUE: Multidetector CT imaging of the abdomen and pelvis was performed using the standard protocol following bolus administration of intravenous contrast. RADIATION DOSE REDUCTION: This exam was performed according to the departmental dose-optimization program which includes automated exposure control, adjustment of the mA and/or kV according to patient size and/or use of  iterative reconstruction technique. CONTRAST:  84m OMNIPAQUE IOHEXOL 350 MG/ML SOLN COMPARISON:  None Available. FINDINGS: Evaluation is limited due to streak artifact caused by patient's arms. Lower chest: Partially visualized trace left pleural effusion versus pleural thickening. The visualized lung bases are otherwise clear. No intra-abdominal free air or free fluid. Hepatobiliary: Fatty liver. No biliary dilatation. The gallbladder is unremarkable. Pancreas: Unremarkable. No pancreatic ductal dilatation or surrounding inflammatory changes. Spleen: Normal in size without focal abnormality. Adrenals/Urinary Tract: The adrenal glands are unremarkable. The kidneys, visualized ureters appear unremarkable. There is diffuse thickened appearance of the bladder wall which may be partly related to underdistention. Correlation with urinalysis recommended to evaluate for cystitis. Stomach/Bowel: There are small scattered colonic diverticula without active inflammatory changes. There is no bowel obstruction or active inflammation. The appendix is normal. Vascular/Lymphatic: Mild aortoiliac atherosclerotic disease. The IVC is unremarkable. Six no portal venous gas. There is no adenopathy. Reproductive: Prostate and seminal vesicles are grossly unremarkable. No pelvic mass. Other: None Musculoskeletal: Degenerative changes of the spine. No acute osseous pathology. IMPRESSION: 1. No acute intra-abdominal or pelvic pathology. 2. Fatty liver. 3. Small scattered colonic diverticula. No bowel obstruction. Normal appendix. 4. Partially visualized trace left pleural effusion versus pleural thickening. 5.  Aortic Atherosclerosis (ICD10-I70.0). Electronically Signed   By: AAnner CreteM.D.   On: 02/06/2023 22:42   DG Tibia/Fibula Right Port  Result Date: 02/06/2023 CLINICAL DATA:  Knee injury EXAM: PORTABLE RIGHT TIBIA AND FIBULA - 2 VIEW COMPARISON:  None Available. FINDINGS: There is no evidence of fracture or other focal  bone lesions. Soft tissues are unremarkable. IMPRESSION: Negative. Electronically Signed   By: KRolm BaptiseM.D.   On: 02/06/2023 21:57   DG Tibia/Fibula Left Port  Result Date: 02/06/2023 CLINICAL DATA:  Ulcers both lower extremities. EXAM: PORTABLE LEFT TIBIA AND FIBULA - 2 VIEW COMPARISON:  None Available. FINDINGS: There is no evidence of fracture or other focal bone lesions. Soft tissues are unremarkable. No soft tissue gas or radiopaque foreign body. IMPRESSION: Negative. Electronically Signed   By: KRolm BaptiseM.D.   On: 02/06/2023 21:57   DG Knee 1-2 Views Right  Result Date: 02/06/2023 CLINICAL DATA:  Knee injury EXAM: RIGHT KNEE - 1-2 VIEW COMPARISON:  08/31/2022 FINDINGS: No evidence of fracture, dislocation, or joint effusion. No evidence of arthropathy or other focal bone abnormality. Soft tissues are unremarkable. IMPRESSION: Negative. Electronically Signed   By: KRolm BaptiseM.D.   On: 02/06/2023 21:56   CT Cervical Spine Wo Contrast  Result Date: 02/06/2023 CLINICAL DATA:  Fall, scalp contusions. EXAM: CT CERVICAL SPINE WITHOUT CONTRAST TECHNIQUE: Multidetector CT imaging of the cervical spine was performed without intravenous contrast. Multiplanar CT image reconstructions were also generated. RADIATION DOSE REDUCTION: This exam was performed according to the departmental dose-optimization program which includes automated exposure control, adjustment of the mA and/or kV according to patient size and/or use of iterative reconstruction technique. COMPARISON:  01/31/2022 FINDINGS: Despite efforts by the technologist and patient, motion artifact is present on today's exam and could not be eliminated. This reduces exam sensitivity and specificity. Alignment: Loss of the normal cervical lordosis, which can be associated with muscle spasm.  Skull base and vertebrae: No acute fracture or acute bony findings. Soft tissues and spinal canal: Mild bilateral common carotid atherosclerotic vascular  calcification. Disc levels: Moderate right and mild left foraminal stenosis at C5-6 due to disc bulge and uncinate spurring. Borderline bilateral foraminal stenosis at C6-7 likewise due to disc bulge and mild uncinate spurring. Upper chest: Unremarkable Other: The scout image demonstrates deformity compatible with healed fracture in the right humeral diaphysis and left midclavicle. IMPRESSION: 1. No acute cervical spine findings. 2. Loss of the normal cervical lordosis, which can be associated with muscle spasm. 3. Cervical spondylosis and degenerative disc disease causing mild-to-moderate foraminal impingement at C5-6 and borderline foraminal stenosis at C6-7. 4. Mild bilateral common carotid atherosclerotic vascular calcification. Electronically Signed   By: Van Clines M.D.   On: 02/06/2023 18:13   CT Head Wo Contrast  Result Date: 02/06/2023 CLINICAL DATA:  Head trauma, fall, history of subdural hematoma. EXAM: CT HEAD WITHOUT CONTRAST TECHNIQUE: Contiguous axial images were obtained from the base of the skull through the vertex without intravenous contrast. RADIATION DOSE REDUCTION: This exam was performed according to the departmental dose-optimization program which includes automated exposure control, adjustment of the mA and/or kV according to patient size and/or use of iterative reconstruction technique. COMPARISON:  11/30/2022 FINDINGS: Brain: Previous subdural hematoma along the right falx and tentorium has intervally cleared. The brainstem, cerebellum, cerebral peduncles, thalami, basal ganglia, basilar cisterns, and ventricular system appear within normal limits. No intracranial hemorrhage, mass lesion, or acute CVA. Vascular: Unremarkable Skull: Unremarkable Sinuses/Orbits: Old nasal bone and left zygomatic arch fractures. Chronic right maxillary sinusitis and mild chronic ethmoid sinusitis. Small mastoid effusions bilaterally. Other: Scalp hematomas along the bilateral parietal region, image  25 series 4. IMPRESSION: 1. No acute intracranial findings. Previous subdural hematoma along the right falx and tentorium has completely cleared. 2. Scalp hematomas along the bilateral parietal region. 3. 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 4. Chronic right maxillary sinusitis and mild chronic ethmoid sinusitis. Small mastoid effusions bilaterally. Electronically Signed   By: Van Clines M.D.   On: 02/06/2023 18:05   CT Angio Chest PE W and/or Wo Contrast  Result Date: 02/06/2023 CLINICAL DATA:  Fall, bruising on the right ribs. EXAM: CT ANGIOGRAPHY CHEST WITH CONTRAST TECHNIQUE: Multidetector CT imaging of the chest was performed using the standard protocol during bolus administration of intravenous contrast. Multiplanar CT image reconstructions and MIPs were obtained to evaluate the vascular anatomy. RADIATION DOSE REDUCTION: This exam was performed according to the departmental dose-optimization program which includes automated exposure control, adjustment of the mA and/or kV according to patient size and/or use of iterative reconstruction technique. CONTRAST:  34m OMNIPAQUE IOHEXOL 350 MG/ML SOLN COMPARISON:  Chest radiograph 02/06/2023 FINDINGS: Cardiovascular: No filling defect is identified in the pulmonary arterial tree to suggest pulmonary embolus. Coronary, aortic arch, and branch vessel atherosclerotic vascular disease. Mild cardiomegaly. Mediastinum/Nodes: No pathologic adenopathy. Circumferential wall thickening in the distal half of the thoracic esophagus extending to the GE junction. This may be a manifestation of esophagitis although is technically nonspecific. Distal paraesophageal lymph node 0.5 cm in short axis on image 94 series 5, possibly reactive. Lungs/Pleura: Scattered mild linear subsegmental atelectasis or scarring. Airway thickening is present, suggesting bronchitis or reactive airways disease. There is frothy fluid in both the right and left mainstem bronchi.  Mild dependent atelectasis in the left lower lobe. Nodularity associated with right upper lobe bandlike density on image 27 series 6, nodular component 9 by 7  by 9 mm (volume = 300 mm^3). Pleural-based nodular density in the left upper lobe just above the major fissure, 6 by 11 by 9 mm (volume = 300 mm^3) on image 40 of series 6. Upper Abdomen: Diffuse hepatic steatosis. Musculoskeletal: Mild midthoracic spondylosis. Review of the MIP images confirms the above findings. IMPRESSION: 1. No filling defect is identified in the pulmonary arterial tree to suggest pulmonary embolus. 2. Nodularity associated with right upper lobe bandlike density nodular component 9 by 7 by 9 mm (volume = 300 mm^3). Pleural-based nodular density in the left upper lobe just above the major fissure, 6 by 11 by 9 mm (volume = 300 mm^3). Per Fleischner Society Guidelines, consider a non-contrast Chest CT at 3 months, a PET/CT, or tissue sampling. These guidelines do not apply to immunocompromised patients and patients with cancer. Follow up in patients with significant comorbidities as clinically warranted. For lung cancer screening, adhere to Lung-RADS guidelines. Reference: Radiology. 2017; 284(1):228-43. 3. Circumferential wall thickening in the distal half of the thoracic esophagus extending to the GE junction. This may be a manifestation of esophagitis although is technically nonspecific. 4. Airway thickening is present, suggesting bronchitis or reactive airways disease. Frothy fluid in both the right and left mainstem bronchi. 5. Coronary, aortic arch, and branch vessel atherosclerotic vascular disease. Mild cardiomegaly. 6. Diffuse hepatic steatosis. 7. Aortic and coronary atherosclerosis. Aortic Atherosclerosis (ICD10-I70.0). Electronically Signed   By: Van Clines M.D.   On: 02/06/2023 18:01   DG Chest Port 1 View  Result Date: 02/06/2023 CLINICAL DATA:  Shortness of breath. EXAM: PORTABLE CHEST 1 VIEW COMPARISON:  None  Available. FINDINGS: Diffuse interstitial prominence, which may reflect mild interstitial pulmonary edema versus atypical or viral infection. Normal heart size and mediastinal contours. No pleural effusion or pneumothorax. Old fracture deformity of the left clavicle IMPRESSION: Diffuse interstitial prominence, which may reflect mild interstitial pulmonary edema versus atypical or viral infection. Electronically Signed   By: Emmit Alexanders M.D.   On: 02/06/2023 09:59     Today   Subjective    Darlina Guys today has no headache,no chest abdominal pain,no new weakness tingling or numbness, feels much better. wants to go home today.    Objective   Blood pressure 120/70, pulse 84, temperature 98.1 F (36.7 C), temperature source Oral, resp. rate 18, height '5\' 4"'$  (1.626 m), weight 71.6 kg, SpO2 95 %.   Intake/Output Summary (Last 24 hours) at 02/16/2023 1049 Last data filed at 02/16/2023 0500 Gross per 24 hour  Intake 660 ml  Output 1200 ml  Net -540 ml    Exam  Awake Alert, No new F.N deficits,    Gate.AT,PERRAL Supple Neck,   Symmetrical Chest wall movement, Good air movement bilaterally, CTAB RRR,No Gallops,   +ve B.Sounds, Abd Soft, Non tender,  No Cyanosis, Clubbing or edema    Data Review   Recent Labs  Lab 02/12/23 0336 02/13/23 0223 02/14/23 0332 02/15/23 0311  WBC 7.0 6.8 7.9 7.5  HGB 13.4 13.4 12.9* 13.1  HCT 39.0 37.1* 36.7* 37.0*  PLT 214 269 323 384  MCV 100.0 97.6 99.5 100.3*  MCH 34.4* 35.3* 35.0* 35.5*  MCHC 34.4 36.1* 35.1 35.4  RDW 11.9 11.9 11.9 11.9  LYMPHSABS 2.0 2.1 2.3 1.8  MONOABS 1.8* 1.8* 1.6* 1.3*  EOSABS 0.4 0.3 0.3 0.2  BASOSABS 0.1 0.1 0.1 0.1    Recent Labs  Lab 02/11/23 0338 02/12/23 0336 02/13/23 0223 02/14/23 0332 02/15/23 0311 02/15/23 1143  NA 129* 129* 128*  130* 136  --   K 3.8 3.8 3.5 3.7 3.6  --   CL 99 94* 95* 98 105  --   CO2 21* 29 25 21* 23  --   ANIONGAP '9 6 8 11 8  '$ --   GLUCOSE 96 102* 115* 93 101*  --   BUN  <5* '9 11 10 11  '$ --   CREATININE 0.66 0.74 0.72 0.61 0.71  --   AST  --  28 32 28 28  --   ALT  --  '26 29 28 26  '$ --   ALKPHOS  --  78 84 74 81  --   BILITOT  --  0.8 0.7 0.6 0.3  --   ALBUMIN  --  2.9* 2.7* 2.9* 2.9*  --   CRP  --   --   --   --   --  0.8  PROCALCITON  --   --   --   --  <0.10  --   TSH  --  11.926* 8.128*  --   --   --   BNP  --  88.6 89.3 80.7 140.7*  --   MG 1.5* 1.7 1.6* 1.8 1.7  --   CALCIUM 8.8* 8.9 8.7* 8.6* 8.9  --      Total Time in preparing paper work, data evaluation and todays exam - 35 minutes  Signature  -    Lala Lund M.D on 02/16/2023 at 10:49 AM   -  To page go to [email or web address]

## 2023-02-16 NOTE — Discharge Instructions (Signed)
Follow with Primary MD in 7 days   Get CBC, CMP, Magnesium, 2 view Chest X ray -  checked next visit with your primary MD   Activity: As tolerated with Full fall precautions use walker/cane & assistance as needed  Disposition Home   Diet: Heart Healthy   Special Instructions: If you have smoked or chewed Tobacco  in the last 2 yrs please stop smoking, stop any regular Alcohol  and or any Recreational drug use.  On your next visit with your primary care physician please Get Medicines reviewed and adjusted.  Please request your Prim.MD to go over all Hospital Tests and Procedure/Radiological results at the follow up, please get all Hospital records sent to your Prim MD by signing hospital release before you go home.  If you experience worsening of your admission symptoms, develop shortness of breath, life threatening emergency, suicidal or homicidal thoughts you must seek medical attention immediately by calling 911 or calling your MD immediately  if symptoms less severe.  You Must read complete instructions/literature along with all the possible adverse reactions/side effects for all the Medicines you take and that have been prescribed to you. Take any new Medicines after you have completely understood and accpet all the possible adverse reactions/side effects.

## 2023-02-16 NOTE — TOC Progression Note (Signed)
Transition of Care [facility]) - Progression Note    Patient Details  Name: Joseph Bell MRN: [id number] Date of Birth: 1961/09/28  [facility]) CM/SW Contact  Levonne Lapping, RN Phone Number: 02/16/2023, 1:33 PM  Clinical Narrative:    CM was notified that Patient has spoken to his preacher and he will now be discharging to the [facility] in [place]. CSW notified and will assist with that plan. RN Producer, television/film/video notified      Expected Discharge Plan: Home/Self Care Barriers to Discharge: Barriers Resolved  Expected Discharge Plan and Services In-house Referral: Clinical Social Work     Living arrangements for the past 2 months: Homeless ([place]) Expected Discharge Date: 02/16/23                                     Social Determinants of Health (SDOH) Interventions    Readmission Risk Interventions     No data to display

## 2023-02-23 ENCOUNTER — Encounter (HOSPITAL_COMMUNITY): Payer: Self-pay

## 2023-02-23 ENCOUNTER — Emergency Department (HOSPITAL_COMMUNITY)
Admission: EM | Admit: 2023-02-23 | Discharge: 2023-02-23 | Discharge status: Against Medical Advice | Payer: Self-pay | Attending: Emergency Medicine | Admitting: Emergency Medicine

## 2023-02-23 ENCOUNTER — Other Ambulatory Visit: Payer: Self-pay

## 2023-02-23 DIAGNOSIS — R531 Weakness: Secondary | ICD-10-CM | POA: Insufficient documentation

## 2023-02-23 DIAGNOSIS — Z5321 Procedure and treatment not carried out due to patient leaving prior to being seen by health care provider: Secondary | ICD-10-CM | POA: Insufficient documentation

## 2023-02-23 NOTE — ED Provider Triage Note (Signed)
Emergency Medicine Provider Triage Evaluation Note  Joseph Bell , a 62 y.o. male  was evaluated in triage.  Pt complains of his legs not working. States they stopped working after he drank a beer. Denies trauma.   Review of Systems  Positive: Leg issues Negative: swelling  Physical Exam  BP 118/78   Pulse 83   Temp 98 F (36.7 C)   Resp 20   Ht 5\' 5"  (1.651 m)   Wt 81.6 kg   SpO2 100%   BMI 29.94 kg/m  Gen:   Awake, no distress, intoxicated Resp:  Normal effort  MSK:   Moves extremities without difficulty, able to ambulate with walker w/ease  Medical Decision Making  Medically screening exam initiated at 3:47 PM.  Appropriate orders placed.  [name] was informed that the remainder of the evaluation will be completed by another provider, this initial triage assessment does not replace that evaluation, and the importance of remaining in the ED until their evaluation is complete.   Osvaldo Shipper, [place] 02/23/23 [phone]

## 2023-02-23 NOTE — ED Triage Notes (Signed)
Pt arrived via [facility]. Pt was picked up from the streets. Pt told EMS his legs stopped working. When pt was in ambulance he told them his legs stopped working after he drank his 40 oz of beer. Pt able to move legs. Pt has 2+ pedal pulse bilat, cap refill less than 3 sec, warm to touch, sensation intact. PT was able to kick his own boots off.

## 2023-02-23 NOTE — ED Notes (Signed)
Pt called for a room x2 with no answer

## 2023-03-01 ENCOUNTER — Emergency Department (HOSPITAL_COMMUNITY)
Admission: EM | Admit: 2023-03-01 | Discharge: 2023-03-01 | Disposition: A | Discharge status: Home / Self Care | Payer: Self-pay | Attending: Emergency Medicine | Admitting: Emergency Medicine

## 2023-03-01 ENCOUNTER — Encounter (HOSPITAL_COMMUNITY): Payer: Self-pay

## 2023-03-01 ENCOUNTER — Emergency Department (HOSPITAL_COMMUNITY): Payer: Self-pay

## 2023-03-01 DIAGNOSIS — W19XXXA Unspecified fall, initial encounter: Secondary | ICD-10-CM

## 2023-03-01 DIAGNOSIS — S0990XA Unspecified injury of head, initial encounter: Secondary | ICD-10-CM | POA: Insufficient documentation

## 2023-03-01 DIAGNOSIS — M545 Low back pain, unspecified: Secondary | ICD-10-CM | POA: Insufficient documentation

## 2023-03-01 DIAGNOSIS — W109XXA Fall (on) (from) unspecified stairs and steps, initial encounter: Secondary | ICD-10-CM | POA: Insufficient documentation

## 2023-03-01 DIAGNOSIS — S2232XA Fracture of one rib, left side, initial encounter for closed fracture: Secondary | ICD-10-CM | POA: Insufficient documentation

## 2023-03-01 DIAGNOSIS — F1011 Alcohol abuse, in remission: Secondary | ICD-10-CM

## 2023-03-01 MED ORDER — OXYCODONE HCL 5 MG PO TABS
5.0000 mg | ORAL_TABLET | ORAL | Status: AC
  Administered 2023-03-01: 5 mg via ORAL
  Filled 2023-03-01: qty 1

## 2023-03-01 MED ORDER — LORAZEPAM 1 MG PO TABS: 1.0000 mg | ORAL_TABLET | ORAL | Status: DC | PRN

## 2023-03-01 MED ORDER — LIDOCAINE 5 % EX PTCH
1.0000 | MEDICATED_PATCH | CUTANEOUS | Status: DC
  Administered 2023-03-01: 1 via TRANSDERMAL
  Filled 2023-03-01: qty 1

## 2023-03-01 MED ORDER — THIAMINE HCL 100 MG/ML IJ SOLN
100.0000 mg | Freq: Every day | INTRAMUSCULAR | Status: DC
  Filled 2023-03-01: qty 2

## 2023-03-01 MED ORDER — THIAMINE MONONITRATE 100 MG PO TABS
100.0000 mg | ORAL_TABLET | Freq: Every day | ORAL | Status: DC
  Administered 2023-03-01: 100 mg via ORAL
  Filled 2023-03-01: qty 1

## 2023-03-01 MED ORDER — DIPHENHYDRAMINE-ZINC ACETATE 2-0.1 % EX CREA
TOPICAL_CREAM | CUTANEOUS | Status: AC
  Filled 2023-03-01: qty 28

## 2023-03-01 MED ORDER — ADULT MULTIVITAMIN W/MINERALS CH
1.0000 | ORAL_TABLET | Freq: Every day | ORAL | Status: DC
  Administered 2023-03-01: 1 via ORAL
  Filled 2023-03-01: qty 1

## 2023-03-01 MED ORDER — CHLORDIAZEPOXIDE HCL 25 MG PO CAPS: 50.0000 mg | ORAL_CAPSULE | Freq: Once | ORAL | Status: DC

## 2023-03-01 MED ORDER — FOLIC ACID 1 MG PO TABS
1.0000 mg | ORAL_TABLET | Freq: Every day | ORAL | Status: DC
  Administered 2023-03-01: 1 mg via ORAL
  Filled 2023-03-01: qty 1

## 2023-03-01 NOTE — ED Provider Notes (Signed)
[facility] Provider Note   CSN: [id number] Arrival date & time: 03/01/23  1457     History  Chief Complaint  Patient presents with   Hip Pain    Joseph Bell is a 62 y.o. male.  62 year old male with a history of alcohol abuse and subdural hematoma presents emergency department after a fall.  Patient was on the front porch and reports that he slipped on the wet porch landing on his bottom.  Does believe that he had a head strike.  Not on blood thinners.  Last drink was yesterday.  Unable to quantify exactly how much he drinks.  Denies any neck pain.  No LOC during the fall.  Thinks he has a bump on his head.  Also is complaining of right hip and lower back pain.       Home Medications Prior to Admission medications   Medication Sig Start Date End Date Taking? Authorizing Provider  colchicine 0.6 MG tablet Take 1 tablet (0.6 mg total) by mouth daily. 06/27/21 07/27/21  Barb Merino, MD  doxycycline (VIBRAMYCIN) 100 MG capsule Take 1 capsule (100 mg total) by mouth 2 (two) times daily. 01/14/23   Larene Pickett, PA-C      Allergies    Patient has no known allergies.    Review of Systems   Review of Systems  Physical Exam Updated Vital Signs BP (!) 119/55   Pulse 90   Temp 98.5 F (36.9 C) (Oral)   Resp 15   SpO2 98%  Physical Exam Constitutional:      General: He is not in acute distress.    Appearance: Normal appearance. He is not ill-appearing.  HENT:     Head: Normocephalic.     Comments: Old healed scabs on patient's head.  No new appearing cuts or hematomas noted.    Right Ear: External ear normal.     Left Ear: External ear normal.     Mouth/Throat:     Mouth: Mucous membranes are moist.     Pharynx: Oropharynx is clear.  Eyes:     Extraocular Movements: Extraocular movements intact.     Conjunctiva/sclera: Conjunctivae normal.     Pupils: Pupils are equal, round, and reactive to light.  Neck:      Comments: No C-spine midline tenderness to palpation Cardiovascular:     Rate and Rhythm: Normal rate and regular rhythm.     Pulses: Normal pulses.     Heart sounds: Normal heart sounds.  Pulmonary:     Effort: Pulmonary effort is normal. No respiratory distress.     Breath sounds: Normal breath sounds.  Abdominal:     General: Abdomen is flat. Bowel sounds are normal.     Palpations: Abdomen is soft.     Tenderness: There is no abdominal tenderness. There is no guarding.  Musculoskeletal:        General: No deformity. Normal range of motion.     Cervical back: No rigidity or tenderness.     Comments: No tenderness to palpation of midline thoracis spine.  Tenderness to palpation of approximately L3 lumbar spine.  No step-offs palpated.  Tenderness palpation of left chest wall.  No bruising noted.  No tenderness to palpation of bilateral clavicles.  No tenderness to palpation, bruising, or deformities noted of bilateral shoulders, elbows, wrists, hips, knees, or ankles.  Neurological:     General: No focal deficit present.     Mental Status: He is  alert and oriented to person, place, and time. Mental status is at baseline.     Cranial Nerves: No cranial nerve deficit.     Sensory: No sensory deficit.     Motor: No weakness.     ED Results / Procedures / Treatments   Labs (all labs ordered are listed, but only abnormal results are displayed) Labs Reviewed - No data to display  EKG None  Radiology CT Head Wo Contrast  Result Date: 03/01/2023 CLINICAL DATA:  Recent fall with headaches, initial encounter EXAM: CT HEAD WITHOUT CONTRAST TECHNIQUE: Contiguous axial images were obtained from the base of the skull through the vertex without intravenous contrast. RADIATION DOSE REDUCTION: This exam was performed according to the departmental dose-optimization program which includes automated exposure control, adjustment of the mA and/or kV according to patient size and/or use of  iterative reconstruction technique. COMPARISON:  None Available. FINDINGS: Brain: No evidence of acute infarction, hemorrhage, hydrocephalus, extra-axial collection or mass lesion/mass effect. Chronic atrophic and ischemic changes are noted. Vascular: No hyperdense vessel or unexpected calcification. Skull: Normal. Negative for fracture or focal lesion. Sinuses/Orbits: No acute finding. Other: None. IMPRESSION: Chronic atrophic and ischemic changes.  No acute abnormality noted. Electronically Signed   By: Inez Catalina M.D.   On: 03/01/2023 19:38   DG Lumbar Spine Complete  Result Date: 03/01/2023 CLINICAL DATA:  Fall with back pain. EXAM: LUMBAR SPINE - COMPLETE 4+ VIEW COMPARISON:  CT abdomen pelvis dated 02/06/2023. FINDINGS: There is no evidence of lumbar spine fracture. Alignment is normal. There is moderate degenerative disc and joint disease in the lumbar spine. IMPRESSION: No acute osseous injury in the lumbar spine. Electronically Signed   By: Zerita Boers M.D.   On: 03/01/2023 16:31   DG Chest 2 View  Result Date: 03/01/2023 CLINICAL DATA:  Fall. EXAM: CHEST - 2 VIEW COMPARISON:  02/15/2023. FINDINGS: Rotated patient. No focal airspace opacity. Normal heart size. Stable mediastinal contours. No pleural effusion or pneumothorax. Chronic fracture deformity of the left posterior tenth rib. Age-indeterminate fracture deformity of the left lateral ninth rib. IMPRESSION: 1.  No evidence of acute cardiopulmonary disease. 2. Age-indeterminate fracture deformity of the left lateral ninth rib. Correlate with point tenderness and consider dedicated rib radiographs for further evaluation. Electronically Signed   By: Emmit Alexanders M.D.   On: 03/01/2023 16:25   DG Knee Complete 4 Views Right  Result Date: 03/01/2023 CLINICAL DATA:  Fall with pain. EXAM: RIGHT KNEE - COMPLETE 4+ VIEW COMPARISON:  Right knee radiographs dated 02/06/2023. FINDINGS: No evidence of fracture, dislocation, or joint effusion.  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 Soft tissues are unremarkable. IMPRESSION: No acute osseous injury. Electronically Signed   By: Zerita Boers M.D.   On: 03/01/2023 16:24   DG Hip Unilat W or Wo Pelvis 2-3 Views Right  Result Date: 03/01/2023 CLINICAL DATA:  fall EXAM: DG HIP (WITH OR WITHOUT PELVIS) 2-3V RIGHT COMPARISON:  None Available. FINDINGS: There is no evidence of hip fracture or dislocation. There is no evidence of arthropathy or other focal bone abnormality. IMPRESSION: Negative. Electronically Signed   By: Margaretha Sheffield M.D.   On: 03/01/2023 16:23    Procedures Procedures   Medications Ordered in ED Medications  LORazepam (ATIVAN) tablet 1-4 mg (has no administration in time range)  thiamine (VITAMIN B1) tablet 100 mg (100 mg Oral Given 03/01/23 1840)    Or  thiamine (VITAMIN B1) injection 100 mg ( Intravenous See Alternative [id number])  folic acid (FOLVITE)  tablet 1 mg (1 mg Oral Given 03/01/23 1839)  multivitamin with minerals tablet 1 tablet (1 tablet Oral Given 03/01/23 1840)  lidocaine (LIDODERM) 5 % 1 patch (1 patch Transdermal Patch Applied 03/01/23 1840)  oxyCODONE (Oxy IR/ROXICODONE) immediate release tablet 5 mg (5 mg Oral Given 03/01/23 1839)  diphenhydrAMINE-zinc acetate (BENADRYL) 2-0.1 % cream ( Topical Given 03/01/23 1941)    ED Course/ Medical Decision Making/ A&P                             Medical Decision Making Amount and/or Complexity of Data Reviewed Radiology: ordered.  Risk OTC drugs. Prescription drug management.   FONG MARCOS is a 62 y.o. male with comorbidities that complicate the patient evaluation including alcohol bolus and subdural hematoma who presents emergency department with fall with hip pain, back pain, and head strike  Initial Ddx:  Subdural hematoma, C-spine injury, hip fracture, lumbar spine fracture, alcohol withdrawals  MDM:  Given patient's history of subdural hematoma and alcohol abuse will obtain a  CT scan at this time.  Does not appear to be clinically intoxicated and does not have any distracting injuries and has no cervical spine tenderness to palpation.  Will also obtain x-rays to evaluate for hip fracture and lumbar spine fracture.  Will put patient on CIWA to monitor for alcohol withdrawals as well.  Plan:  CT head Pelvis x-ray Lumbar spine x-ray  ED Summary/Re-evaluation:  Patient underwent the above workup that did not show any acute abnormalities.  Did have a two-view chest that was ordered from triage that showed age-indeterminate left-sided rib fracture.  Patient did appear to be tender to palpation in this location though he was not initially complaining of pain here.  Informed the patient that he may have an acute rib fracture and instructed him to take over-the-counter medications and use the incentive spirometer that we have given him for his fracture.  Do not feel that it would be safe to prescribe him opiates given his alcohol abuse.  Patient ambulated about the emergency department without difficulty with the assistance of his walker.  Was discharged with instructions to follow-up with his primary doctor in several days.  Patient did have itching on his back and was given Benadryl cream for this.  This patient presents to the ED for concern of complaints listed in HPI, this involves an extensive number of treatment options, and is a complaint that carries with it a high risk of complications and morbidity. Disposition including potential need for admission considered.   Dispo: DC Home. Return precautions discussed including, but not limited to, those listed in the AVS. Allowed pt time to ask questions which were answered fully prior to dc.  Additional history obtained from EMS Records reviewed Outpatient Clinic Notes I independently reviewed the following imaging with scope of interpretation limited to determining acute life threatening conditions related to emergency care:  Chest x-ray and CT Head and agree with the radiologist interpretation with the following exceptions: none I personally reviewed and interpreted cardiac monitoring: normal sinus rhythm  I personally reviewed and interpreted the pt's EKG: see above for interpretation  I have reviewed the patients home medications and made adjustments as needed Social Determinants of health:  Etoh abuse   Final Clinical Impression(s) / ED Diagnoses Final diagnoses:  Fall, initial encounter  Closed fracture of one rib of left side, initial encounter  History of alcohol abuse    Rx /  DC Orders ED Discharge Orders     None         Fransico Meadow, MD 03/01/23 2104

## 2023-03-01 NOTE — Discharge Instructions (Addendum)
You were seen for your rib pain in the emergency department.  You are found to have a broken rib on your left side.  This will heal with time.  At home, please take Tylenol and use lidocaine patches which can be obtained over-the-counter your pain.  Please also use the incentive spirometer we have given you to prevent any pneumonias.  Check your MyChart online for the results of any tests that had not resulted by the time you left the emergency department.   Follow-up with your primary doctor in 2-3 days regarding your visit.    Return immediately to the emergency department if you experience any of the following: Difficulty breathing, fever, severe pain, or any other concerning symptoms.    Thank you for visiting our Emergency Department. It was a pleasure taking care of you today.

## 2023-03-01 NOTE — ED Provider Triage Note (Signed)
Emergency Medicine Provider Triage Evaluation Note  Joseph Bell , a 62 y.o. male  was evaluated in triage.  Pt complains of fall.  Happened yesterday.  Dors is pain to right knee, right hip, low back and right chest wall.  He does not think he hit his head but does have a scab on his right nose, unsure how long it has been there..  Review of Systems  Per HPI  Physical Exam  BP 114/75 (BP Location: Right Arm)   Pulse 88   Temp 97.8 F (36.6 C)   Resp 18   SpO2 95%  Gen:   Awake, no distress   Resp:  Normal effort  MSK:   Moves extremities without difficulty  Other:  Scab right nose, ttp right knee, right hip, right chest wall  Medical Decision Making  Medically screening exam initiated at 3:07 PM.  Appropriate orders placed.  Joseph Bell was informed that the remainder of the evaluation will be completed by another provider, this initial triage assessment does not replace that evaluation, and the importance of remaining in the ED until their evaluation is complete.     Joseph Raring, PA-C 03/01/23 [phone]

## 2023-03-01 NOTE — ED Triage Notes (Addendum)
Pt bib ems from [facility]; endorses fall yesterday down 1-2 stairs; endorses hitting head, no loc; fell on buttocks first; c/o R hip and R butt pain; not on thinners; 138/100, cbg 131, P 86, 98% RA; no neuro deficits; also c/o R knee pain, scab noted to R knee

## 2023-03-01 NOTE — Progress Notes (Signed)
[facility] resources added to the patient's chart.

## 2023-03-03 ENCOUNTER — Encounter (HOSPITAL_COMMUNITY): Payer: Self-pay

## 2023-05-01 ENCOUNTER — Other Ambulatory Visit: Payer: Self-pay

## 2023-05-01 ENCOUNTER — Emergency Department (HOSPITAL_COMMUNITY)
Admission: EM | Admit: 2023-05-01 | Discharge: 2023-05-02 | Disposition: A | Discharge status: Home / Self Care | Payer: Self-pay | Attending: Emergency Medicine | Admitting: Emergency Medicine

## 2023-05-01 ENCOUNTER — Emergency Department (HOSPITAL_COMMUNITY): Payer: Self-pay

## 2023-05-01 DIAGNOSIS — F10929 Alcohol use, unspecified with intoxication, unspecified: Secondary | ICD-10-CM | POA: Insufficient documentation

## 2023-05-01 DIAGNOSIS — R079 Chest pain, unspecified: Secondary | ICD-10-CM | POA: Insufficient documentation

## 2023-05-01 DIAGNOSIS — Y908 Blood alcohol level of 240 mg/100 ml or more: Secondary | ICD-10-CM | POA: Insufficient documentation

## 2023-05-01 MED ORDER — IPRATROPIUM-ALBUTEROL 0.5-2.5 (3) MG/3ML IN SOLN
3.0000 mL | Freq: Once | RESPIRATORY_TRACT | Status: AC
  Administered 2023-05-02: 3 mL via RESPIRATORY_TRACT
  Filled 2023-05-01: qty 3

## 2023-05-01 MED ORDER — FENTANYL CITRATE PF 50 MCG/ML IJ SOSY: 50.0000 ug | PREFILLED_SYRINGE | Freq: Once | INTRAMUSCULAR | Status: DC

## 2023-05-01 MED ORDER — METHYLPREDNISOLONE SODIUM SUCC 125 MG IJ SOLR
125.0000 mg | Freq: Once | INTRAMUSCULAR | Status: AC
  Administered 2023-05-02: 125 mg via INTRAVENOUS
  Filled 2023-05-01: qty 2

## 2023-05-01 NOTE — ED Provider Notes (Signed)
[facility] DEPT [facility] Emergency Department Provider Note MRN:  [id number]  Arrival date & time: 05/02/23     Chief Complaint   Chest Pain  History of Present Illness   Joseph Bell is a 62 y.o. year-old male with a history of alcohol use disorder presenting to the ED with chief complaint of chest pain.  Chest pain and SOB this evening while ambulating.  Alcohol use this evening as well.    Review of Systems  I was unable to obtain an accurate HPI, PMH, or ROS due to the patient's intoxication.   Patient's Health History    Past Medical History:  Diagnosis Date   ETOH abuse     Past Surgical History:  Procedure Laterality Date   TEE WITHOUT CARDIOVERSION N/A 06/22/2021   Procedure: TRANSESOPHAGEAL ECHOCARDIOGRAM (TEE);  Surgeon: Lewayne Bunting, MD;  Location: [facility] ENDOSCOPY;  Service: Cardiovascular;  Laterality: N/A;    No family history on file.  Social History   Socioeconomic History   Marital status: Single    Spouse name: Not on file   Number of children: Not on file   Years of education: Not on file   Highest education level: Not on file  Occupational History   Not on file  Tobacco Use   Smoking status: Every Day    Types: Cigarettes   Smokeless tobacco: Not on file  Substance and Sexual Activity   Alcohol use: Yes    Comment: one 40 oz per day   Drug use: No   Sexual activity: Not on file  Other Topics Concern   Not on file  Social History Narrative   ** Merged History Encounter **       Social Determinants of Health   Financial Resource Strain: Not on file  Food Insecurity: Not on file  Transportation Needs: Not on file  Physical Activity: Not on file  Stress: Not on file  Social Connections: Not on file  Intimate Partner Violence: Not on file     Physical Exam   Vitals:   05/02/23 0245 05/02/23 0330  BP: 124/77 123/78  Pulse: 83 67  Resp: 20 16  Temp:    SpO2: 96% 99%    CONSTITUTIONAL:  chronically  ill-appearing, NAD NEURO/PSYCH:  Alert and oriented x 3, no focal deficits EYES:  eyes equal and reactive ENT/NECK:  no LAD, no JVD CARDIO:  regular rate, well-perfused, normal S1 and S2 PULM:  expiratory wheezing, mild tachypnea GI/GU:  non-distended, non-tender MSK/SPINE:  No gross deformities, no edema SKIN:  no rash, atraumatic   *Additional and/or pertinent findings included in MDM below  Diagnostic and Interventional Summary    EKG Interpretation  Date/Time:  Monday May 01 2023 23:06:48 EDT Ventricular Rate:  72 PR Interval:  224 QRS Duration: 88 QT Interval:  410 QTC Calculation: 449 R Axis:   77 Text Interpretation: Sinus rhythm Prolonged PR interval Minimal ST depression, diffuse leads No significant change was found Confirmed by Kennis Carina [phone]) on 05/01/2023 11:21:16 PM       Labs Reviewed  CBC - Abnormal; Notable for the following components:      Result Value   RBC 3.87 (*)    MCV 104.9 (*)    MCH 35.4 (*)    All other components within normal limits  COMPREHENSIVE METABOLIC PANEL - Abnormal; Notable for the following components:   Glucose, Bld 103 (*)    BUN 6 (*)    Calcium 8.5 (*)  All other components within normal limits  ETHANOL - Abnormal; Notable for the following components:   Alcohol, Ethyl (B) 382 (*)    All other components within normal limits  D-DIMER, QUANTITATIVE - Abnormal; Notable for the following components:   D-Dimer, Quant 1.92 (*)    All other components within normal limits  TROPONIN I (HIGH SENSITIVITY) - Abnormal; Notable for the following components:   Troponin I (High Sensitivity) 21 (*)    All other components within normal limits  TROPONIN I (HIGH SENSITIVITY) - Abnormal; Notable for the following components:   Troponin I (High Sensitivity) 22 (*)    All other components within normal limits  LIPASE, BLOOD    CT Angio Chest Pulmonary Embolism (PE) W or WO Contrast  Final Result    DG Chest Port 1 View  Final  Result      Medications  fentaNYL (SUBLIMAZE) injection 50 mcg (0 mcg Intravenous Hold 05/02/23 0226)  methylPREDNISolone sodium succinate (SOLU-MEDROL) 125 mg/2 mL injection 125 mg (125 mg Intravenous Given 05/02/23 0232)  ipratropium-albuterol (DUONEB) 0.5-2.5 (3) MG/3ML nebulizer solution 3 mL (3 mLs Nebulization Given 05/02/23 0230)  iohexol (OMNIPAQUE) 350 MG/ML injection 75 mL (75 mLs Intravenous Contrast Given 05/02/23 0309)     Procedures  /  Critical Care Procedures  ED Course and Medical Decision Making  Initial Impression and Ddx Patient appears uncomfortable holding his left chest with left hand.  Wheezing on exam.  Says he fell prior to the pain.  Ddx includes ACS, rib fracture, PTX, COPD, also considering but less likely PE, dissection.  Past medical/surgical history that increases complexity of ED encounter:  alcohol use disorder  Interpretation of Diagnostics I personally reviewed the EKG and my interpretation is as follows:  SR with st segment abnormalities similar to prior on record  Labs reassuring with no significant blood count or electrolyte disturbance, troponin minimally elevated and flat upon repeat.  With elevated D-dimer CT is obtained with no evidence of PE.  Patient Reassessment and Ultimate Disposition/Management     Patient sleeping comfortably, I did have an ethanol level greater than 300.  Now seems clinically sober enough for discharge as there is no emergent process evident.  Patient management required discussion with the following services or consulting groups:  None  Complexity of Problems Addressed Acute illness or injury that poses threat of life of bodily function  Additional Data Reviewed and Analyzed Further history obtained from: None  Additional Factors Impacting ED Encounter Risk Consideration of hospitalization  Elmer Sow. Pilar Plate, MD [facility] Emergency Medicine [facility] [email or web address]  Final Clinical  Impressions(s) / ED Diagnoses     ICD-10-CM   1. Chest pain, unspecified type  R07.9     2. Alcoholic intoxication with complication [facility])  Z61.096       ED Discharge Orders     None        Discharge Instructions Discussed with and Provided to Patient:     Discharge Instructions      You were evaluated in the Emergency Department and after careful evaluation, we did not find any emergent condition requiring admission or further testing in the hospital.  Your exam/testing today was overall reassuring.  Please return to the Emergency Department if you experience any worsening of your condition.  Thank you for allowing [place] to be a part of your care.        Sabas Sous, MD 05/02/23 [phone]

## 2023-05-02 ENCOUNTER — Emergency Department (HOSPITAL_COMMUNITY): Payer: Self-pay

## 2023-05-02 LAB — CBC
HCT: 40.6 % (ref 39.0–52.0)
Hemoglobin: 13.7 g/dL (ref 13.0–17.0)
MCH: 35.4 pg — ABNORMAL HIGH (ref 26.0–34.0)
MCHC: 33.7 g/dL (ref 30.0–36.0)
MCV: 104.9 fL — ABNORMAL HIGH (ref 80.0–100.0)
Platelets: 225 10*3/uL (ref 150–400)
RBC: 3.87 MIL/uL — ABNORMAL LOW (ref 4.22–5.81)
RDW: 12.7 % (ref 11.5–15.5)
WBC: 6.9 10*3/uL (ref 4.0–10.5)
nRBC: 0 % (ref 0.0–0.2)

## 2023-05-02 LAB — LIPASE, BLOOD: Lipase: 39 U/L (ref 11–51)

## 2023-05-02 LAB — COMPREHENSIVE METABOLIC PANEL
ALT: 18 U/L (ref 0–44)
AST: 32 U/L (ref 15–41)
Albumin: 3.7 g/dL (ref 3.5–5.0)
Alkaline Phosphatase: 80 U/L (ref 38–126)
Anion gap: 13 (ref 5–15)
BUN: 6 mg/dL — ABNORMAL LOW (ref 8–23)
CO2: 22 mmol/L (ref 22–32)
Calcium: 8.5 mg/dL — ABNORMAL LOW (ref 8.9–10.3)
Chloride: 101 mmol/L (ref 98–111)
Creatinine, Ser: 0.71 mg/dL (ref 0.61–1.24)
GFR, Estimated: >60 mL/min (ref >60)
Glucose, Bld: 103 mg/dL — ABNORMAL HIGH (ref 70–99)
Potassium: 3.8 mmol/L (ref 3.5–5.1)
Sodium: 136 mmol/L (ref 135–145)
Total Bilirubin: 0.3 mg/dL (ref 0.3–1.2)
Total Protein: 7.6 g/dL (ref 6.5–8.1)

## 2023-05-02 LAB — TROPONIN I (HIGH SENSITIVITY)
Troponin I (High Sensitivity): 21 ng/L — ABNORMAL HIGH (ref <18)
Troponin I (High Sensitivity): 22 ng/L — ABNORMAL HIGH (ref <18)

## 2023-05-02 LAB — D-DIMER, QUANTITATIVE: D-Dimer, Quant: 1.92 ug/mL-FEU — ABNORMAL HIGH (ref 0.00–0.50)

## 2023-05-02 LAB — ETHANOL: Alcohol, Ethyl (B): 382 mg/dL (ref <10)

## 2023-05-02 MED ORDER — IOHEXOL 350 MG/ML SOLN
75.0000 mL | Freq: Once | INTRAVENOUS | Status: AC | PRN
  Administered 2023-05-02: 75 mL via INTRAVENOUS

## 2023-05-02 NOTE — Discharge Instructions (Signed)
You were evaluated in the Emergency Department and after careful evaluation, we did not find any emergent condition requiring admission or further testing in the hospital.  Your exam/testing today was overall reassuring.  Please return to the Emergency Department if you experience any worsening of your condition.  Thank you for allowing us to be a part of your care.  

## 2023-11-14 ENCOUNTER — Other Ambulatory Visit: Payer: Self-pay

## 2023-12-29 ENCOUNTER — Emergency Department (HOSPITAL_COMMUNITY): Payer: Self-pay

## 2023-12-29 ENCOUNTER — Other Ambulatory Visit: Payer: Self-pay

## 2023-12-29 ENCOUNTER — Emergency Department (HOSPITAL_COMMUNITY)
Admission: EM | Admit: 2023-12-29 | Discharge: 2023-12-29 | Disposition: A | Discharge status: Home / Self Care | Payer: Self-pay | Attending: Student | Admitting: Student

## 2023-12-29 ENCOUNTER — Encounter (HOSPITAL_COMMUNITY): Payer: Self-pay

## 2023-12-29 DIAGNOSIS — S0181XA Laceration without foreign body of other part of head, initial encounter: Secondary | ICD-10-CM

## 2023-12-29 DIAGNOSIS — S0990XA Unspecified injury of head, initial encounter: Secondary | ICD-10-CM | POA: Insufficient documentation

## 2023-12-29 DIAGNOSIS — F17210 Nicotine dependence, cigarettes, uncomplicated: Secondary | ICD-10-CM | POA: Insufficient documentation

## 2023-12-29 DIAGNOSIS — F1090 Alcohol use, unspecified, uncomplicated: Secondary | ICD-10-CM | POA: Insufficient documentation

## 2023-12-29 DIAGNOSIS — M7989 Other specified soft tissue disorders: Secondary | ICD-10-CM | POA: Insufficient documentation

## 2023-12-29 DIAGNOSIS — S01112A Laceration without foreign body of left eyelid and periocular area, initial encounter: Secondary | ICD-10-CM | POA: Insufficient documentation

## 2023-12-29 DIAGNOSIS — S82832A Other fracture of upper and lower end of left fibula, initial encounter for closed fracture: Secondary | ICD-10-CM | POA: Insufficient documentation

## 2023-12-29 MED ORDER — NAPROXEN 250 MG PO TABS
500.0000 mg | ORAL_TABLET | Freq: Once | ORAL | Status: AC
  Administered 2023-12-29: 500 mg via ORAL
  Filled 2023-12-29: qty 2

## 2023-12-29 MED ORDER — HYDROCODONE-ACETAMINOPHEN 5-325 MG PO TABS
1.0000 | ORAL_TABLET | Freq: Once | ORAL | Status: AC
  Administered 2023-12-29: 1 via ORAL
  Filled 2023-12-29: qty 1

## 2023-12-29 MED ORDER — NAPROXEN 375 MG PO TABS: 375.0000 mg | ORAL_TABLET | Freq: Two times a day (BID) | ORAL | 0 refills | Status: AC

## 2023-12-29 MED ORDER — HYDROCODONE-ACETAMINOPHEN 5-325 MG PO TABS: 2.0000 | ORAL_TABLET | ORAL | 0 refills | Status: AC | PRN
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
## 2023-12-29 NOTE — Progress Notes (Signed)
Orthopedic Tech Progress Note Patient Details:  Joseph Bell [birth year] [id number] Applied cam boot to patients LLE Ortho Devices Type of Ortho Device: CAM walker Ortho Device/Splint Location: LLE Ortho Device/Splint Interventions: Ordered, Application, Adjustment   Post Interventions Patient Tolerated: Well Instructions Provided: Adjustment of device, Care of device, Poper ambulation with device  [name] 12/29/2023, 6:02 AM
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
## 2024-06-21 ENCOUNTER — Encounter (HOSPITAL_COMMUNITY): Payer: Self-pay

## 2024-06-21 ENCOUNTER — Emergency Department (HOSPITAL_COMMUNITY)
Admission: EM | Admit: 2024-06-21 | Discharge: 2024-06-22 | Disposition: A | Discharge status: Home / Self Care | Payer: Self-pay | Attending: Emergency Medicine | Admitting: Emergency Medicine

## 2024-06-21 ENCOUNTER — Other Ambulatory Visit: Payer: Self-pay

## 2024-06-21 DIAGNOSIS — M79604 Pain in right leg: Secondary | ICD-10-CM | POA: Insufficient documentation

## 2024-06-21 DIAGNOSIS — F1020 Alcohol dependence, uncomplicated: Secondary | ICD-10-CM | POA: Insufficient documentation

## 2024-06-21 DIAGNOSIS — Y908 Blood alcohol level of 240 mg/100 ml or more: Secondary | ICD-10-CM | POA: Insufficient documentation
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
## 2024-06-22 ENCOUNTER — Emergency Department (HOSPITAL_COMMUNITY): Payer: Self-pay

## 2024-06-22 LAB — BASIC METABOLIC PANEL WITH GFR
Anion gap: 10 (ref 5–15)
BUN: <5 mg/dL — ABNORMAL LOW (ref 8–23)
CO2: 21 mmol/L — ABNORMAL LOW (ref 22–32)
Calcium: 8 mg/dL — ABNORMAL LOW (ref 8.9–10.3)
Chloride: 100 mmol/L (ref 98–111)
Creatinine, Ser: 0.55 mg/dL — ABNORMAL LOW (ref 0.61–1.24)
GFR, Estimated: >60 mL/min (ref >60)
Glucose, Bld: 98 mg/dL (ref 70–99)
Potassium: 3.3 mmol/L — ABNORMAL LOW (ref 3.5–5.1)
Sodium: 131 mmol/L — ABNORMAL LOW (ref 135–145)

## 2024-06-22 LAB — CBC WITH DIFFERENTIAL/PLATELET
Abs Immature Granulocytes: 0.03 K/uL (ref 0.00–0.07)
Basophils Absolute: 0.1 K/uL (ref 0.0–0.1)
Basophils Relative: 1 %
Eosinophils Absolute: 0.2 K/uL (ref 0.0–0.5)
Eosinophils Relative: 3 %
HCT: 36.8 % — ABNORMAL LOW (ref 39.0–52.0)
Hemoglobin: 12.9 g/dL — ABNORMAL LOW (ref 13.0–17.0)
Immature Granulocytes: 0 %
Lymphocytes Relative: 28 %
Lymphs Abs: 2.3 K/uL (ref 0.7–4.0)
MCH: 36.4 pg — ABNORMAL HIGH (ref 26.0–34.0)
MCHC: 35.1 g/dL (ref 30.0–36.0)
MCV: 104 fL — ABNORMAL HIGH (ref 80.0–100.0)
Monocytes Absolute: 1.2 K/uL — ABNORMAL HIGH (ref 0.1–1.0)
Monocytes Relative: 15 %
Neutro Abs: 4.2 K/uL (ref 1.7–7.7)
Neutrophils Relative %: 53 %
Platelets: 220 K/uL (ref 150–400)
RBC: 3.54 MIL/uL — ABNORMAL LOW (ref 4.22–5.81)
RDW: 12 % (ref 11.5–15.5)
WBC: 8 K/uL (ref 4.0–10.5)
nRBC: 0 % (ref 0.0–0.2)

## 2024-06-22 LAB — ETHANOL: Alcohol, Ethyl (B): 290 mg/dL — ABNORMAL HIGH (ref <15)

## 2024-06-27 ENCOUNTER — Other Ambulatory Visit: Payer: Self-pay

## 2024-10-04 ENCOUNTER — Encounter (HOSPITAL_COMMUNITY): Payer: Self-pay | Admitting: Emergency Medicine

## 2024-10-04 ENCOUNTER — Emergency Department (HOSPITAL_COMMUNITY): Payer: Self-pay

## 2024-10-04 ENCOUNTER — Emergency Department (HOSPITAL_COMMUNITY)
Admission: EM | Admit: 2024-10-04 | Discharge: 2024-10-04 | Disposition: A | Discharge status: Home / Self Care | Payer: Self-pay | Attending: Emergency Medicine | Admitting: Emergency Medicine

## 2024-10-04 ENCOUNTER — Other Ambulatory Visit: Payer: Self-pay

## 2024-10-04 DIAGNOSIS — S80812A Abrasion, left lower leg, initial encounter: Secondary | ICD-10-CM | POA: Insufficient documentation

## 2024-10-04 DIAGNOSIS — Z5900 Homelessness unspecified: Secondary | ICD-10-CM | POA: Insufficient documentation

## 2024-10-04 DIAGNOSIS — S82832A Other fracture of upper and lower end of left fibula, initial encounter for closed fracture: Secondary | ICD-10-CM

## 2024-10-04 DIAGNOSIS — W19XXXA Unspecified fall, initial encounter: Secondary | ICD-10-CM

## 2024-10-04 DIAGNOSIS — S0101XA Laceration without foreign body of scalp, initial encounter: Secondary | ICD-10-CM | POA: Insufficient documentation

## 2024-10-04 MED ORDER — LIDOCAINE-EPINEPHRINE (PF) 2 %-1:200000 IJ SOLN
10.0000 mL | Freq: Once | INTRAMUSCULAR | Status: AC
  Administered 2024-10-04: 10 mL
  Filled 2024-10-04: qty 20

## 2024-10-04 MED ORDER — HYDROCODONE-ACETAMINOPHEN 5-325 MG PO TABS
1.0000 | ORAL_TABLET | Freq: Once | ORAL | Status: AC
  Administered 2024-10-04: 1 via ORAL
  Filled 2024-10-04: qty 1

## 2024-10-04 MED ORDER — CEFADROXIL 500 MG PO CAPS: 500.0000 mg | ORAL_CAPSULE | Freq: Two times a day (BID) | ORAL | 0 refills | Status: AC

## 2024-10-04 MED ORDER — ACETAMINOPHEN 325 MG PO TABS
325.0000 mg | ORAL_TABLET | Freq: Once | ORAL | Status: AC
Start: 2024-10-04 — End: 2024-10-04
  Administered 2024-10-04: 325 mg via ORAL
  Filled 2024-10-04: qty 1
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
## 2024-10-04 NOTE — Progress Notes (Addendum)
 TOC/ICM consulted for DME, transportation for uninsured pt c/a tibular fx. Letter of Guarantee and taxi voucher approved by Josefa RAMAN., MSW, LCSW, Advanced Care Supervisor. LOG faxed to [employer], fax confirmation received, ED staff updated.   Resources for PCP clinics for the uninsured added to AVS.

## 2024-10-04 NOTE — ED Notes (Signed)
 Ortho called to assist with CAM boot

## 2024-10-04 NOTE — ED Notes (Signed)
 Patient transported to X-ray

## 2024-10-04 NOTE — Progress Notes (Signed)
 Orthopedic Tech Progress Note Patient Details:  Joseph Bell [birth year] [id number]  Ortho Devices Type of Ortho Device: CAM walker Ortho Device/Splint Location: LLE Ortho Device/Splint Interventions: Ordered, Application, Adjustment   Post Interventions Patient Tolerated: Well Instructions Provided: Care of device, Adjustment of device  Joseph Bell 10/04/2024, 11:46 AM

## 2024-10-04 NOTE — ED Notes (Signed)
 Pt has laceration to top of left head and abrasion to left shin.  Bleeding controlled.
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
## 2024-10-04 NOTE — ED Notes (Signed)
 Ortho tech at bedside

## 2024-12-05 DEATH — deceased
